# Patient Record
Sex: Female | Born: 1940 | Race: White | Hispanic: No | Smoking: Former smoker
Health system: Southern US, Community
[De-identification: ages and names within clinical notes are randomized; demographics above are authoritative.]

## PROBLEM LIST (undated history)

## (undated) DIAGNOSIS — Z9889 Other specified postprocedural states: Secondary | ICD-10-CM

## (undated) DIAGNOSIS — E039 Hypothyroidism, unspecified: Secondary | ICD-10-CM

## (undated) DIAGNOSIS — G4733 Obstructive sleep apnea (adult) (pediatric): Secondary | ICD-10-CM

## (undated) DIAGNOSIS — I2510 Atherosclerotic heart disease of native coronary artery without angina pectoris: Secondary | ICD-10-CM

## (undated) DIAGNOSIS — Z8679 Personal history of other diseases of the circulatory system: Secondary | ICD-10-CM

## (undated) DIAGNOSIS — M179 Osteoarthritis of knee, unspecified: Secondary | ICD-10-CM

## (undated) DIAGNOSIS — F419 Anxiety disorder, unspecified: Secondary | ICD-10-CM

## (undated) DIAGNOSIS — I4891 Unspecified atrial fibrillation: Secondary | ICD-10-CM

## (undated) DIAGNOSIS — R160 Hepatomegaly, not elsewhere classified: Secondary | ICD-10-CM

## (undated) DIAGNOSIS — E041 Nontoxic single thyroid nodule: Secondary | ICD-10-CM

## (undated) DIAGNOSIS — H3530 Unspecified macular degeneration: Secondary | ICD-10-CM

## (undated) DIAGNOSIS — E785 Hyperlipidemia, unspecified: Secondary | ICD-10-CM

## (undated) DIAGNOSIS — G4700 Insomnia, unspecified: Secondary | ICD-10-CM

## (undated) DIAGNOSIS — I5032 Chronic diastolic (congestive) heart failure: Secondary | ICD-10-CM

## (undated) DIAGNOSIS — R001 Bradycardia, unspecified: Secondary | ICD-10-CM

## (undated) DIAGNOSIS — N200 Calculus of kidney: Secondary | ICD-10-CM

## (undated) DIAGNOSIS — M1710 Unilateral primary osteoarthritis, unspecified knee: Secondary | ICD-10-CM

## (undated) DIAGNOSIS — Z7901 Long term (current) use of anticoagulants: Secondary | ICD-10-CM

## (undated) HISTORY — DX: Atherosclerotic heart disease of native coronary artery without angina pectoris: I25.10

## (undated) HISTORY — DX: Obstructive sleep apnea (adult) (pediatric): G47.33

## (undated) HISTORY — DX: Insomnia, unspecified: G47.00

## (undated) HISTORY — DX: Unilateral primary osteoarthritis, unspecified knee: M17.10

## (undated) HISTORY — PX: CARDIAC CATHETERIZATION: SHX172

## (undated) HISTORY — PX: BACK SURGERY: SHX140

## (undated) HISTORY — DX: Other specified postprocedural states: Z98.890

## (undated) HISTORY — DX: Unspecified atrial fibrillation: I48.91

## (undated) HISTORY — PX: CATARACT EXTRACTION W/ INTRAOCULAR LENS  IMPLANT, BILATERAL: SHX1307

## (undated) HISTORY — DX: Hypothyroidism, unspecified: E03.9

## (undated) HISTORY — DX: Hyperlipidemia, unspecified: E78.5

## (undated) HISTORY — DX: Long term (current) use of anticoagulants: Z79.01

## (undated) HISTORY — DX: Personal history of other diseases of the circulatory system: Z86.79

## (undated) HISTORY — DX: Nontoxic single thyroid nodule: E04.1

## (undated) HISTORY — DX: Chronic diastolic (congestive) heart failure: I50.32

## (undated) HISTORY — DX: Unspecified macular degeneration: H35.30

## (undated) HISTORY — PX: BREAST BIOPSY: SHX20

## (undated) HISTORY — DX: Osteoarthritis of knee, unspecified: M17.9

## (undated) HISTORY — PX: DILATION AND CURETTAGE OF UTERUS: SHX78

## (undated) HISTORY — DX: Bradycardia, unspecified: R00.1

## (undated) HISTORY — PX: CARDIOVERSION: SHX1299

---
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
## 1984-07-14 HISTORY — PX: ABDOMINAL HYSTERECTOMY: SHX81

## 1995-07-15 HISTORY — PX: LUMBAR LAMINECTOMY: SHX95

## 1997-11-01 ENCOUNTER — Ambulatory Visit (HOSPITAL_COMMUNITY): Admission: RE | Admit: 1997-11-01 | Discharge: 1997-11-01 | Payer: Self-pay | Admitting: *Deleted

## 1998-12-24 ENCOUNTER — Ambulatory Visit (HOSPITAL_COMMUNITY): Admission: RE | Admit: 1998-12-24 | Discharge: 1998-12-24 | Payer: Self-pay | Admitting: Endocrinology

## 1998-12-24 ENCOUNTER — Encounter: Payer: Self-pay | Admitting: Endocrinology

## 1999-02-19 ENCOUNTER — Other Ambulatory Visit: Admission: RE | Admit: 1999-02-19 | Discharge: 1999-02-19 | Payer: Self-pay | Admitting: Gynecology

## 1999-12-12 ENCOUNTER — Other Ambulatory Visit: Admission: RE | Admit: 1999-12-12 | Discharge: 1999-12-12 | Payer: Self-pay | Admitting: Gynecology

## 1999-12-23 ENCOUNTER — Encounter: Admission: RE | Admit: 1999-12-23 | Discharge: 1999-12-23 | Payer: Self-pay | Admitting: Gynecology

## 1999-12-23 ENCOUNTER — Encounter: Payer: Self-pay | Admitting: Gynecology

## 2000-12-28 ENCOUNTER — Ambulatory Visit (HOSPITAL_COMMUNITY): Admission: RE | Admit: 2000-12-28 | Discharge: 2000-12-28 | Payer: Self-pay | Admitting: Endocrinology

## 2000-12-28 ENCOUNTER — Encounter: Payer: Self-pay | Admitting: Endocrinology

## 2000-12-29 ENCOUNTER — Encounter: Admission: RE | Admit: 2000-12-29 | Discharge: 2000-12-29 | Payer: Self-pay | Admitting: Gynecology

## 2000-12-29 ENCOUNTER — Encounter: Payer: Self-pay | Admitting: Gynecology

## 2001-02-01 ENCOUNTER — Encounter: Payer: Self-pay | Admitting: Internal Medicine

## 2001-02-01 ENCOUNTER — Encounter: Admission: RE | Admit: 2001-02-01 | Discharge: 2001-02-01 | Payer: Self-pay | Admitting: Internal Medicine

## 2001-02-17 ENCOUNTER — Other Ambulatory Visit: Admission: RE | Admit: 2001-02-17 | Discharge: 2001-02-17 | Payer: Self-pay | Admitting: Gynecology

## 2001-06-28 ENCOUNTER — Ambulatory Visit (HOSPITAL_COMMUNITY): Admission: RE | Admit: 2001-06-28 | Discharge: 2001-06-28 | Payer: Self-pay | Admitting: Endocrinology

## 2001-06-28 ENCOUNTER — Encounter: Payer: Self-pay | Admitting: Endocrinology

## 2001-11-13 ENCOUNTER — Ambulatory Visit (HOSPITAL_COMMUNITY): Admission: RE | Admit: 2001-11-13 | Discharge: 2001-11-13 | Payer: Self-pay | Admitting: Internal Medicine

## 2001-11-13 ENCOUNTER — Encounter: Payer: Self-pay | Admitting: Internal Medicine

## 2002-01-11 ENCOUNTER — Encounter: Payer: Self-pay | Admitting: Gynecology

## 2002-01-11 ENCOUNTER — Encounter: Admission: RE | Admit: 2002-01-11 | Discharge: 2002-01-11 | Payer: Self-pay | Admitting: Gynecology

## 2002-02-21 ENCOUNTER — Other Ambulatory Visit: Admission: RE | Admit: 2002-02-21 | Discharge: 2002-02-21 | Payer: Self-pay | Admitting: Gynecology

## 2002-12-27 ENCOUNTER — Encounter: Payer: Self-pay | Admitting: Endocrinology

## 2002-12-27 ENCOUNTER — Ambulatory Visit (HOSPITAL_COMMUNITY): Admission: RE | Admit: 2002-12-27 | Discharge: 2002-12-27 | Payer: Self-pay | Admitting: Endocrinology

## 2003-01-26 ENCOUNTER — Encounter: Payer: Self-pay | Admitting: Gynecology

## 2003-01-26 ENCOUNTER — Encounter: Admission: RE | Admit: 2003-01-26 | Discharge: 2003-01-26 | Payer: Self-pay | Admitting: Gynecology

## 2003-04-17 ENCOUNTER — Other Ambulatory Visit: Admission: RE | Admit: 2003-04-17 | Discharge: 2003-04-17 | Payer: Self-pay | Admitting: Gynecology

## 2003-05-25 ENCOUNTER — Ambulatory Visit (HOSPITAL_COMMUNITY): Admission: RE | Admit: 2003-05-25 | Discharge: 2003-05-25 | Payer: Self-pay | Admitting: Gastroenterology

## 2003-07-15 DIAGNOSIS — Z9889 Other specified postprocedural states: Secondary | ICD-10-CM

## 2003-07-15 HISTORY — DX: Other specified postprocedural states: Z98.890

## 2003-12-22 ENCOUNTER — Ambulatory Visit (HOSPITAL_COMMUNITY): Admission: RE | Admit: 2003-12-22 | Discharge: 2003-12-22 | Payer: Self-pay | Admitting: Endocrinology

## 2004-02-08 ENCOUNTER — Encounter: Admission: RE | Admit: 2004-02-08 | Discharge: 2004-02-08 | Payer: Self-pay | Admitting: Gynecology

## 2004-04-18 ENCOUNTER — Other Ambulatory Visit: Admission: RE | Admit: 2004-04-18 | Discharge: 2004-04-18 | Payer: Self-pay | Admitting: Gynecology

## 2004-05-14 HISTORY — PX: MITRAL VALVE REPAIR: SHX2039

## 2004-05-14 HISTORY — PX: TRICUSPID VALVE SURGERY: SHX817

## 2004-08-19 ENCOUNTER — Encounter (HOSPITAL_COMMUNITY): Admission: RE | Admit: 2004-08-19 | Discharge: 2004-11-17 | Payer: Self-pay | Admitting: Interventional Cardiology

## 2005-04-08 ENCOUNTER — Encounter: Admission: RE | Admit: 2005-04-08 | Discharge: 2005-04-08 | Payer: Self-pay | Admitting: Gynecology

## 2005-04-24 ENCOUNTER — Other Ambulatory Visit: Admission: RE | Admit: 2005-04-24 | Discharge: 2005-04-24 | Payer: Self-pay | Admitting: Gynecology

## 2006-04-28 ENCOUNTER — Encounter: Admission: RE | Admit: 2006-04-28 | Discharge: 2006-04-28 | Payer: Self-pay | Admitting: Gynecology

## 2006-05-05 ENCOUNTER — Other Ambulatory Visit: Admission: RE | Admit: 2006-05-05 | Discharge: 2006-05-05 | Payer: Self-pay | Admitting: Gynecology

## 2006-05-12 ENCOUNTER — Encounter: Admission: RE | Admit: 2006-05-12 | Discharge: 2006-05-12 | Payer: Self-pay | Admitting: Gynecology

## 2007-06-15 ENCOUNTER — Encounter: Admission: RE | Admit: 2007-06-15 | Discharge: 2007-06-15 | Payer: Self-pay | Admitting: Gynecology

## 2008-05-03 ENCOUNTER — Ambulatory Visit: Payer: Self-pay | Admitting: Internal Medicine

## 2008-05-16 ENCOUNTER — Encounter: Admission: RE | Admit: 2008-05-16 | Discharge: 2008-05-16 | Payer: Self-pay | Admitting: Interventional Cardiology

## 2008-05-18 ENCOUNTER — Ambulatory Visit (HOSPITAL_COMMUNITY): Admission: RE | Admit: 2008-05-18 | Discharge: 2008-05-18 | Payer: Self-pay | Admitting: Interventional Cardiology

## 2008-06-20 ENCOUNTER — Encounter: Admission: RE | Admit: 2008-06-20 | Discharge: 2008-06-20 | Payer: Self-pay | Admitting: Gynecology

## 2009-02-11 HISTORY — PX: KNEE CARTILAGE SURGERY: SHX688

## 2009-06-21 ENCOUNTER — Encounter: Admission: RE | Admit: 2009-06-21 | Discharge: 2009-06-21 | Payer: Self-pay | Admitting: Gynecology

## 2010-07-16 ENCOUNTER — Encounter
Admission: RE | Admit: 2010-07-16 | Discharge: 2010-07-16 | Payer: Self-pay | Source: Home / Self Care | Attending: Gynecology | Admitting: Gynecology

## 2010-08-03 ENCOUNTER — Encounter: Payer: Self-pay | Admitting: Gynecology

## 2010-10-22 ENCOUNTER — Other Ambulatory Visit: Payer: Self-pay | Admitting: Internal Medicine

## 2010-10-22 DIAGNOSIS — M542 Cervicalgia: Secondary | ICD-10-CM

## 2010-10-28 ENCOUNTER — Ambulatory Visit
Admission: RE | Admit: 2010-10-28 | Discharge: 2010-10-28 | Disposition: A | Discharge status: Home / Self Care | Payer: Medicare Other | Source: Ambulatory Visit | Attending: Internal Medicine | Admitting: Internal Medicine

## 2010-10-28 DIAGNOSIS — M542 Cervicalgia: Secondary | ICD-10-CM

## 2010-11-26 NOTE — Letter (Signed)
May 03, 2008    Lyn Records, MD  [address] [place], [address]  [place], [place] [address]   RE:  Jo Hughes  MRN:  [id number]  /  DOB:  03-15-1941   Dear Jo Hughes:   It was a pleasure seeing Jo Hughes at your request because of her  atrial flutter.   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  Tenny Hughes' eyes, and their eyes until 2005 when she underwent surgical  repair.  I had a chance to see the operative note and a right atrial  incision and a left atrial roots incision were both made.  She had  postoperative atrial fibrillation that was self resolving and amiodarone  was initiated.   At some point, amiodarone was stopped in the fall of 2007 and then she  had recurrence of arrhythmia on June 02, 2006.  An electrocardiogram  of this is described below, but was notable for atrial fibrillation.  Amiodarone was resumed at that time.  It was continued until March 2009  at which point, it was discontinued again.  She saw Dr. Kirby Hughes in  the summer of 2009.  Electrocardiogram was erroneously read as sinus  rhythm, you accurately read as atrial flutter and then Holter monitoring  demonstrated persistence of atrial flutter and amiodarone was then  reinitiated.  She is referred for consideration of catheter ablation.   She is quite convinced that there is a close correlation between her  symptoms of fatigue, exercise intolerance, and the presence of her  atrial arrhythmias.   There is also noted that in the past that there was a tremor with  amiodarone.  She did not mention that to me today, but I see the note  from April 27, 2008.   PAST MEDICAL  HISTORY:  1. In addition to the above, has been notable for treated      hypothyroidism, anxiety/depression, night sweats that had been      relatively continues overtime.  2. Dry eyes.   REVIEW OF SYSTEMS:  Broadly negative apart from the above.  She did made  mention of the tremors in the situation, but did not link in her mind to  amiodarone.   PAST SURGICAL HISTORY:  Notable for breast biopsies x2 most recently 17  years ago.  She is status post hysterectomy, back surgery, in addition  to the heart surgery.   SOCIAL HISTORY:  She is married.  She has no children.  She does not use  cigarettes or recreational drugs.  She drinks alcohol daily.  She does  walk 30 minutes a day.  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   MEDICATIONS:  1. Vytorin 10/40.  2. Minocycline 25 a day.  3. Synthroid 80 mcg.  4. Furosemide 40.  5. Potassium.  6. Amiodarone 200 a day.  7. Warfarin.  8. Paroxetine 37.5 nightly.  9. Bupropion 150 daily.  10.Restasis drops/Elestat drops.   ALLERGIES:  She has no known drug allergies.   PHYSICAL EXAMINATION:  GENERAL:  She is an older Caucasian female who  appear in her stated age of 69.  VITAL SIGNS:  Her blood pressure  is 114/70, her pulse is 60, and her  weight is 146.  HEENT:  Demonstrated no icterus or xanthoma.  NECK:  Veins are flat.  The carotids are brisk and full bilaterally  without bruits.  BACK:  Without kyphosis or scoliosis.  LUNGS:  Clear.  HEART:  The heart sounds are irregular, but no murmurs are appreciated.  ABDOMEN:  Soft with active bowel sounds.  There is no midline pulsation  or hepatomegaly.  EXTREMITIES:  Femoral pulses are 2+.  Distal pulses are intact.  There  is no clubbing, cyanosis, or edema.  NEUROLOGIC:  Grossly normal.  SKIN:  Warm and dry.   Electrocardiograms were reviewed.  The tracing from November 2007  demonstrated atrial fibrillation that was quite coarse, but clearly  irregular.   Electrocardiograms  from the fall of 2009 demonstrated an atrial flutter  that is atypical in this upright in lead V1, but is also upright in the  inferior leads.   Holter monitor confirmed atrial flutter.   IMPRESSION:  1. Atrial flutter and atrial fibrillation.  2. Status post tricuspid and mitral valve repair with an atrial      lateral incision and left atrial root incision.  3. Normal left ventricular function.  4. Significant symptoms of exercise impairment associated with the      above.  5. History of depression.   Thank you, it was pleasure seeing Jo Hughes.  I mentioned in my phone  call that there may be benefit in demonstrating whether she is  symptomatically improved being maintained in sinus rhythm by either drug  therapy or catheter ablation. .  I would suggest that we  proceed with  cardioversion to help [place] answer that.  Catheter ablation in an  asymptomatic patient probably needs to wait on [place] getting hard outcome  endpoints  Flutter circuits that she could have are around the right atrial  incision and the left atrial incision and at the tricuspid annulus as  well as some combinations of the above.  Extensive mapping can identify  those and potentially eliminate them, and the new mapping system, which  [facility] has recently purchased is, is key for something like that.  In  addition, however with her atrial fibrillation, she would also probably  be considered for pulmonary vein isolation. a I think, the likelihood of  recurrence of atrial arrhythmias is not at all trivial and so failing  medical therapy would be an appropriate trigger for consideration of  that kind of a complex procedure.  I have reviewed this extensively with  her.  I will look forward to talking with you more about her directly.    Sincerely,      Jo Salvia, MD, [facility]  Electronically Signed    SCK/MedQ  DD: 05/03/2008  DT: 05/04/2008  Job #: [id number]

## 2010-11-26 NOTE — Consult Note (Signed)
NAMEKIELI, GOLLADAY NO.:  [id number]   MEDICAL RECORD NO.:  [id number]          PATIENT TYPE:  OIB   LOCATION:  2899                         FACILITY:  [facility]   PHYSICIAN:  Lyn Records, M.D.   DATE OF BIRTH:  July 21, 1940   DATE OF CONSULTATION:  DATE OF DISCHARGE:  05/18/2008                                 CONSULTATION   INDICATIONS:  Ms. Jo Hughes is 89 and has a history of mitral valve repair  at the [facility] approximately 2 years ago.  She has some  difficulty with postoperative atrial fibrillation.  Over the past 3  months, she has developed atrial flutter with decreased energy.  She has  been loaded with amiodarone and Coumadin therapy and is now undergoing  cardioversion to restore normal sinus rhythm.   DESCRIPTION:  The patient was seen and determined to still be in atrial  flutter by preprocedure EKG.  She then received Diprivan administered by  Dr. Jacklynn Bue receiving total of 100 mg.  She then received a single  biphasic shock with anterior-posterior electrodes configuration with  return of sinus bradycardia at 47 beats per minute.  The patient  awakened without neurological sequelae.   CONCLUSIONS:  Successful elective electrical cardioversion from atrial  flutter to sinus bradycardia.   PLAN:  Home later today.  Continue amiodarone at 200 mg per day, may  have to decrease dose further because the patient has a tremor since  starting the medication.  She was seen by Dr. Graciela Husbands but was not felt to  be a reasonable candidate for ablation therapy in [place] and if she  needs that will probably be better served by returning to [facility].   ADDENDUM:  Today's INR is 2.3.      Lyn Records, M.D.  Electronically Signed     HWS/MEDQ  D:  05/18/2008  T:  05/18/2008  Job:  [id number]   cc:   Duke Salvia, MD, [facility]

## 2010-11-29 NOTE — Op Note (Signed)
   NAME:  Jo Hughes, Jo Hughes NO.:  [id number]   MEDICAL RECORD NO.:  [id number]                   PATIENT TYPE:  AMB   LOCATION:  ENDO                                 FACILITY:  [facility]   PHYSICIAN:  Danise Edge, M.D.                DATE OF BIRTH:  [birth year]   DATE OF PROCEDURE:  05/25/2003  DATE OF DISCHARGE:                                 OPERATIVE REPORT   PROCEDURE:  Colonoscopy.   INDICATIONS FOR PROCEDURE:  Jo Hughes is a 70 year old female born  1941-05-21.  The patient is scheduled to undergo her first screening  colonoscopy with polypectomy to prevent colon cancer.  She has intermittent  painless hematochezia probably secondary to internal hemorrhoidal bleeding.   The patient requires infective endocarditis prophylaxis and received 2 grams  of ampicillin and 100 mg of gentamicin prior to undergoing colonoscopy.   ENDOSCOPIST:  Danise Edge, M.D.   PREMEDICATION:  Versed 7 mg and Fentanyl 75 mcg.   PROCEDURE:  After obtaining informed consent the patient was placed in the  left lateral decubitus position.  I administered intravenous Fentanyl and  intravenous Versed to achieve conscious sedation for the procedure.  The  patient's blood pressure, oxygen saturation and cardiac rhythm were  monitored throughout the procedure and are documented in the medical record.   Anal inspection was normal.  Digital rectal examination was normal.  The  Olympus adjustable pediatric colonoscope was introduced into the rectum and  advanced to the cecum.  Colonic preparation for the exam today was  excellent.  There was solid vegetable material in her cecum which with  vigorous saline irrigation I was able to for the most part clear to get an  adequate view of the cecal mucosa.   Rectum:  Normal.   Sigmoid colon and descending colon:  Normal.   Splenic flexure:  Normal.   Transverse colon:  Normal.   Hepatic flexure:  Normal.   Ascending colon:  Normal.   Cecum and ileocecal valve:  Normal.    ASSESSMENT:  Normal proctocolonoscopy to the cecum.  No endoscopic evidence  for the present of colorectal neoplasia.                                               Danise Edge, M.D.    MJ/MEDQ  D:  05/25/2003  T:  05/25/2003  Job:  [id number]   cc:   Thora Lance, M.D.  [address]  [place]  [place] [address]  Fax: [phone]

## 2011-08-18 ENCOUNTER — Other Ambulatory Visit: Payer: Self-pay | Admitting: Gynecology

## 2011-08-18 DIAGNOSIS — Z1231 Encounter for screening mammogram for malignant neoplasm of breast: Secondary | ICD-10-CM

## 2011-08-21 ENCOUNTER — Encounter: Payer: Self-pay | Admitting: Internal Medicine

## 2011-08-21 ENCOUNTER — Ambulatory Visit (INDEPENDENT_AMBULATORY_CARE_PROVIDER_SITE_OTHER): Payer: Medicare Other | Admitting: Internal Medicine

## 2011-08-21 DIAGNOSIS — I4891 Unspecified atrial fibrillation: Secondary | ICD-10-CM

## 2011-08-21 DIAGNOSIS — I484 Atypical atrial flutter: Secondary | ICD-10-CM | POA: Insufficient documentation

## 2011-08-21 DIAGNOSIS — I4892 Unspecified atrial flutter: Secondary | ICD-10-CM

## 2011-08-21 DIAGNOSIS — I38 Endocarditis, valve unspecified: Secondary | ICD-10-CM

## 2011-08-21 NOTE — Patient Instructions (Signed)
Your physician recommends that you schedule a follow-up appointment with Dr. Johney Frame as needed.  Follow up with Dr. Katrinka Blazing for cardioversion.  Increase Amiodarone to 200mg  twice daily.  Weekly INR at [facility].

## 2011-08-21 NOTE — Assessment & Plan Note (Signed)
The patient presents today for EP consultation regarding recurrent symptomatic atypical atrial flutter.  She has a h/o valvular heart disease with endocarditis and is s/p mitral and tricuspid repairs 2005 at [facility].  I have reviewed the operative notes from this procedure which reveal that she had a small incision made in the right atrium (exact location not specified) to allow for tricuspid repair.  The mitral valve was approached through the dome of the left atrium.  The atrial appendage was oversewn.   Her atypical flutter could represent cavotricuspid reentry, mitral annular reentry, atriotomy scar flutter, or a different flutter.  She has had adequate rhythm control with amiodarone 200mg  daily but has recently returned to atypical atrial flutter with lower doses of amiodarone (in the setting of tremor).  Therapeutic strategies for atypical atrial flutter including medicine and ablation were discussed in detail with the patient today. Risk, benefits, and alternatives to EP study and radiofrequency ablation for afib were also discussed in detail today.  She understands that given her significant atrial scarring from surgery, valvular disease, and endocarditis that her anticipated success with ablation off of antiarrhythmic drugs long term would be quite low (probably 50%).  At this time, she would like to continue medical therapy.  I will therefore increase amiodarone to 200mg  BID today.  After two weeks, I will ask that Dr Katrinka Blazing proceed with cardioversion.  Hopefully, she will maintain sinus rhythm with amiodarone 200mg  daily going forward.  I do not think that she has any other reasonable antiarrhythmic drug options at this point.  If her atypical flutter or atrial fibrillation recur in the near future, then she would be more willing to proceed with ablation at that time.  She is aware that with increased amiodarone she will need INRs followed closely.  I have instructed her to arrange INR  follow-up in Dr Michaelle Copas office next week. Given her valvular atrial arrhythmias, she is not a candidate for novel anticoagulants at this time.  She is s/p atrial appendage removal and hopefully has a decreased risk for stroke long term.

## 2011-08-21 NOTE — Progress Notes (Signed)
Primary Care Physician: Jo Funk, MD Referring Physician:[name], MD, MD   Jo Hughes is a 71 y.o. female with a h/o valvular heart disease and recurrent atrial arrhythmias who presents today for EP consultation.  She reports that she was initially diagnosed with atrial fibrillation following mitral/ tricuspid valve repair 2005.  She has had intermittent episodes since that time.  She has bene treated with amiodarone and has required cardioversion once.  She reports that she underwent cardioversion 11/52009.  She did well thereafter without further episodes.  Her amiodarone was decreased to 100mg  daily due to tremors 4/11.  Though her tremors improved, she developed recurrent atypical atrial flutter.  She recently returned to [facility] and was found to have returned to atypical atrial flutter.  She reports return of fatigue and decreased exercise tolerance.  Today, she denies symptoms of palpitations, chest pain, lower extremity edema, dizziness, presyncope, syncope, or neurologic sequela. The patient is tolerating medications without difficulties and is otherwise without complaint today.   Past Medical History  Diagnosis Date  . Hx of atrial flutter july 2009    atypical atrial flutter  . H/O mitral valve repair [facility] [place].  . H/O tricuspid valve repair   . Hx of acquired endocarditis     Prior to mitral valve repair on 2 occasions.  . Lumbosacral disc disease   . Hyperlipidemia     cardiac cath clean coronary arties in the past.  . Migraine headache   . Glaucoma   . Dry eyes   . Blepharitis   . Macular degeneration     early , [place]  . Goiter   . 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 Hypothyroid   . Atrial fibrillation     persistent   Past Surgical History  Procedure Date  . Mitral valve repair [facility]  . Lumbar disc surgery 1997  . Tonsillectomy and adenoidectomy   . Vaginal  hysterectomy   . Knee arthroscopy 02/2009    LEFT,GRAVES  . Cataracts 2011    OU     Current Outpatient Prescriptions  Medication Sig Dispense Refill  . ALPRAZolam (XANAX) 0.5 MG tablet Take 0.5 mg by mouth 3 (three) times daily as needed.      Marland Kitchen amiodarone (PACERONE) 200 MG tablet Take 200 mg by mouth daily.      . Calcium Carbonate-Vitamin D (CALCIUM 600 + D PO) Take 1 tablet by mouth 2 (two) times daily.      . cycloSPORINE (RESTASIS) 0.05 % ophthalmic emulsion Apply 1 drop to eye 2 (two) times daily.      . Flaxseed, Linseed, 1000 MG CAPS Take 1 capsule by mouth daily.      . fluticasone (FLONASE) 50 MCG/ACT nasal spray Place 2 sprays into the nose daily.      . furosemide (LASIX) 40 MG tablet Take 40 mg by mouth daily.      Marland Kitchen gabapentin (NEURONTIN) 300 MG capsule Take 300 mg by mouth as directed.      . latanoprost (XALATAN) 0.005 % ophthalmic solution Apply 1 drop to eye daily.      Marland Kitchen levothyroxine (SYNTHROID, LEVOTHROID) 88 MCG tablet Take 88 mcg by mouth daily.      . Omega-3 Fatty Acids (FISH OIL) 1200 MG CAPS Take 1 capsule by mouth daily.      Marland Kitchen OVER THE COUNTER MEDICATION OTC beta  blocker eye drop. Use as directed.      Marland Kitchen PARoxetine (PAXIL-CR) 37.5 MG 24 hr tablet Take 37.5 mg by mouth every morning.      . potassium chloride (KLOR-CON) 20 MEQ packet Take 20 mEq by mouth daily.      . traMADol (ULTRAM) 50 MG tablet Take 50 mg by mouth every 6 (six) hours as needed.      . warfarin (COUMADIN) 5 MG tablet Take 5 mg by mouth daily. Take 2.5 by mouth a day except 5 mg on Monday & Friday.      . zolmitriptan (ZOMIG) 5 MG tablet Take 5 mg by mouth as needed.      . zolpidem (AMBIEN) 10 MG tablet Take 10 mg by mouth at bedtime as needed.      . Multiple Vitamins-Minerals (ICAPS) CAPS Take 1 capsule by mouth daily.      . rosuvastatin (CRESTOR) 40 MG tablet Take 40 mg by mouth daily.        Allergies  Allergen Reactions  . Trazodone And Nefazodone Other (See Comments)     Weakness.    History   Social History  . Marital Status: Married    Spouse Name: N/A    Number of Children: N/A  . Years of Education: N/A   Occupational History  . Not on file.   Social History Main Topics  . Smoking status: Former Smoker    Quit date: 07/14/1985  . Smokeless tobacco: Not on file  . Alcohol Use: 4.0 oz/week    8 drink(s) per week     wine 4 glasses/wk  . Drug Use: No  . Sexually Active: Not on file   Other Topics Concern  . Not on file   Social History Narrative   Lives in [place] with spouse.  No children.  Retired Child psychotherapist.    Family History  Problem Relation Age of Onset  . Stroke Father 49    MI ,DECEASED  . Transient ischemic attack Mother     DECEASED  . Macular degeneration Mother   . Hypertension Brother     ALIVE    ROS- All systems are reviewed and negative except as per the HPI above In addition, she reports nonproductive cough with nasal congestion and hoarseness x 3 days.  She denies fevers, chills, or sore throat  Physical Exam: Filed Vitals:   08/21/11 1539  BP: 132/78  Pulse: 88  Resp: 20  Height: 5\' 5"  (1.651 m)  Weight: 148 lb (67.132 kg)    GEN- The patient is well appearing, alert and oriented x 3 today.   Head- normocephalic, atraumatic Eyes-  Sclera clear, conjunctiva pink Ears- hearing intact Oropharynx- clear without exudate,  She is noticeably hoarse today Neck- supple, no JVP Lymph- no cervical lymphadenopathy Lungs- Clear to ausculation bilaterally, normal work of breathing Heart- irregular rate and rhythm, no murmurs, rubs or gallops, PMI not laterally displaced GI- soft, NT, ND, + BS Extremities- no clubbing, cyanosis, trace edema MS- no significant deformity or atrophy Skin- no rash or lesion Psych- euthymic mood, full affect Neuro- strength and sensation are intact  EKG today reveals atypical appearing atrial flutter with positive flutter waves in V1 as well as the inferior leads, V reate  88,  RBBB, LAD, nonspecific ST/T changes  Assessment and Plan:

## 2011-08-21 NOTE — Assessment & Plan Note (Signed)
Surgical report from 2005 reviewed I do not have results from a recent echo. If she has not had an echo within the last 12 months, I would recommend that one be performed to evaluate her LA size, valvular competence, and LV function.   She will follow closely with Dr Katrinka Blazing sp cardioversion.  I am happy to see her again with the anticipation of proceeding with a complex ablation if her atrial arrhythmias recur.

## 2011-08-21 NOTE — Assessment & Plan Note (Signed)
She is at risk for afib long term.  If in the future we find ourselves ablating in the left atrium, I think that pulmonary vein isolation should be performed at that time.

## 2011-09-03 ENCOUNTER — Encounter (HOSPITAL_COMMUNITY): Payer: Self-pay | Admitting: Pharmacy Technician

## 2011-09-09 ENCOUNTER — Ambulatory Visit
Admission: RE | Admit: 2011-09-09 | Discharge: 2011-09-09 | Disposition: A | Discharge status: Home / Self Care | Payer: Medicare Other | Source: Ambulatory Visit | Attending: Gynecology | Admitting: Gynecology

## 2011-09-09 DIAGNOSIS — Z1231 Encounter for screening mammogram for malignant neoplasm of breast: Secondary | ICD-10-CM

## 2011-09-15 ENCOUNTER — Other Ambulatory Visit: Payer: Self-pay | Admitting: Interventional Cardiology

## 2011-09-18 ENCOUNTER — Other Ambulatory Visit: Payer: Self-pay

## 2011-09-18 ENCOUNTER — Ambulatory Visit (HOSPITAL_COMMUNITY)
Admission: RE | Admit: 2011-09-18 | Discharge: 2011-09-18 | Disposition: A | Discharge status: Home / Self Care | Payer: Medicare Other | Source: Ambulatory Visit | Attending: Interventional Cardiology | Admitting: Interventional Cardiology

## 2011-09-18 ENCOUNTER — Encounter (HOSPITAL_COMMUNITY): Payer: Self-pay | Admitting: Certified Registered"

## 2011-09-18 ENCOUNTER — Ambulatory Visit (HOSPITAL_COMMUNITY): Payer: Medicare Other | Admitting: Certified Registered"

## 2011-09-18 ENCOUNTER — Encounter (HOSPITAL_COMMUNITY)
Admission: RE | Disposition: A | Discharge status: Home / Self Care | Payer: Self-pay | Source: Ambulatory Visit | Attending: Interventional Cardiology

## 2011-09-18 DIAGNOSIS — I4892 Unspecified atrial flutter: Secondary | ICD-10-CM | POA: Insufficient documentation

## 2011-09-18 SURGERY — CARDIOVERSION
Anesthesia: General | Wound class: Clean

## 2011-09-18 MED ORDER — AMIODARONE HCL 200 MG PO TABS: 200.0000 mg | ORAL_TABLET | Freq: Every day | ORAL | Status: DC

## 2011-09-18 MED ORDER — LACTATED RINGERS IV SOLN
INTRAVENOUS | Status: DC | PRN
  Administered 2011-09-18: 09:00:00 via INTRAVENOUS

## 2011-09-18 MED ORDER — HYDROCORTISONE 1 % EX CREA: 1.0000 "application " | TOPICAL_CREAM | Freq: Three times a day (TID) | CUTANEOUS | Status: DC | PRN

## 2011-09-18 MED ORDER — SODIUM CHLORIDE 0.9 % IJ SOLN: 3.0000 mL | Freq: Two times a day (BID) | INTRAMUSCULAR | Status: DC

## 2011-09-18 MED ORDER — PROPOFOL 10 MG/ML IV EMUL
INTRAVENOUS | Status: DC | PRN
  Administered 2011-09-18: 70 mg via INTRAVENOUS

## 2011-09-18 MED ORDER — SODIUM CHLORIDE 0.9 % IJ SOLN: 3.0000 mL | INTRAMUSCULAR | Status: DC | PRN

## 2011-09-18 MED ORDER — SODIUM CHLORIDE 0.9 % IV SOLN: 250.0000 mL | INTRAVENOUS | Status: DC

## 2011-09-18 NOTE — Preoperative (Signed)
Beta Blockers   Reason not to administer Beta Blockers:Not Applicable 

## 2011-09-18 NOTE — Anesthesia Postprocedure Evaluation (Deleted)
Anesthesia Post Note  Patient: Jo Hughes  Procedure(s) Performed: Procedure(s) (LRB): CARDIOVERSION (N/A)  Anesthesia type: general  Patient location: PACU  Post pain: Pain level controlled  Post assessment: Patient's Cardiovascular Status Stable  Last Vitals:  Filed Vitals:   09/18/11 1000  BP: 113/60  Pulse: 47  Temp:   Resp: 18    Post vital signs: Reviewed and stable  Level of consciousness: sedated  Complications: No apparent anesthesia complications

## 2011-09-18 NOTE — Anesthesia Postprocedure Evaluation (Signed)
  Anesthesia Post-op Note  Patient: Jo Hughes  Procedure(s) Performed: Procedure(s) (LRB): CARDIOVERSION (N/A)  Patient Location: Short Stay  Anesthesia Type: MAC  Level of Consciousness: awake, alert  and oriented  Airway and Oxygen Therapy: Patient Spontanous Breathing and Patient connected to nasal cannula oxygen  Post-op Pain: none  Post-op Assessment: Post-op Vital signs reviewed, Patient's Cardiovascular Status Stable, Respiratory Function Stable, Patent Airway, No signs of Nausea or vomiting, Adequate PO intake and Pain level controlled  Post-op Vital Signs: Reviewed and stable  Complications: No apparent anesthesia complications

## 2011-09-18 NOTE — CV Procedure (Signed)
Electrical Cardioversion Procedure Note LATRESA GASSER [id number] 12/21/1940  Procedure: Electrical Cardioversion Indications:  Atrial Flutter  Time Out: Verified patient identification, verified procedure,medications/allergies/relevent history reviewed, required imaging and test results available.  Performed  Procedure Details  The patient was NPO after midnight. Anesthesia was administered at the beside  by Dr. Krista Blue with 70 mg of propofol.  Cardioversion was done with synchronized biphasic defibrillation with AP pads with 150 watts.  The patient converted to normal sinus/sinus bradycardia rhythm. The patient tolerated the procedure well   IMPRESSION:  Successful cardioversion of atrial flutter to normal sinus rhythm/sinus bradycardia.    [name] 09/18/2011, 9:37 AM

## 2011-09-18 NOTE — Discharge Instructions (Signed)
General Anesthetic, Adult A doctor specialized in giving anesthesia (anesthesiologist) or a nurse specialized in giving anesthesia (nurse anesthetist) gives medicine that makes you sleep while a procedure is performed (general anesthetic). Once the general anesthetic has been administered, you will be in a sleeplike state in which you feel no pain. After having a general anestheticyou may feel:   Dizzy.   Weak.   Drowsy.   Confused.  These feelings are normal and can be expected to last for up to 24 hours after the procedure is completed.  LET YOUR CAREGIVER KNOW ABOUT:  Allergies you have.   Medications you are taking, including herbs, eye drops, over the counter medications, dietary supplements, and creams.   Previous problems you have had with anesthetics or numbing medicines.   Use of cigarettes, alcohol, or illicit drugs.   Possibility of pregnancy, if this applies.   History of bleeding or blood disorders, including blood clots and clotting disorders.   Previous surgeries you have had and types of anesthetics you have received.   Family medical history, especially anesthetic problems.   Other health problems.  BEFORE THE PROCEDURE  You may brush your teeth on the morning of surgery but you should have no solid food or non-clear liquids for a minimum of 8 hours prior to your procedure. Clear liquids (water, black coffee, and tea) are acceptable in small amounts until 2 hours prior to your procedure.   You may take your regular medications the morning of your procedure unless your caregiver indicates otherwise.  AFTER THE PROCEDURE  After surgery, you will be taken to the recovery area where a nurse will monitor your progress. You will be allowed to go home when you are awake, stable, taking fluids well, and without serious pain or complications.   For the first 24 hours following an anesthetic:   Have a responsible person with you.   Do not drive a car. If you are  alone, do not take public transportation.   Do not engage in strenuous activity. You may usually resume normal activities the next day, or as advised by your caregiver.   Do not drink alcohol.   Do not take medicine that has not been prescribed by your caregiver.   Do not sign important papers or make important decisions as your judgement may be impaired.   You may resume a normal diet as directed.   Change bandages (dressings) as directed.   Only take over-the-counter or prescription medicines for pain, discomfort, or fever as directed by your caregiver.  If you have questions or problems that seem related to the anesthetic, call the hospital and ask for the anesthetist, anesthesiologist, or anesthesia department. SEEK IMMEDIATE MEDICAL CARE IF:   You develop a rash.   You have difficulty breathing.   You have chest pain.   You have allergic problems.   You have uncontrolled nausea.   You have uncontrolled vomiting.   You develop any serious bleeding, especially from the incision site.  Document Released: 10/07/2007 Document Revised: 06/19/2011 Document Reviewed: 10/31/2010 Exit[employer] Patient Information 2012 [employer]. 

## 2011-09-18 NOTE — Anesthesia Preprocedure Evaluation (Addendum)
Anesthesia Evaluation  Patient identified by MRN, date of birth, ID band Patient awake    Reviewed: Allergy & Precautions, H&P , NPO status , Patient's Chart, lab work & pertinent test results  Airway Mallampati: I TM Distance: >3 FB Neck ROM: Full    Dental  (+) Teeth Intact   Pulmonary    Pulmonary exam normal       Cardiovascular + dysrhythmias Atrial Fibrillation Rhythm:Irregular Rate:Normal     Neuro/Psych  Headaches,    GI/Hepatic   Endo/Other  Hypothyroidism   Renal/GU      Musculoskeletal   Abdominal   Peds  Hematology   Anesthesia Other Findings   Reproductive/Obstetrics                          Anesthesia Physical Anesthesia Plan  ASA: III  Anesthesia Plan: General   Post-op Pain Management:    Induction: Intravenous  Airway Management Planned: Mask  Additional Equipment:   Intra-op Plan:   Post-operative Plan: Extubation in OR  Informed Consent: I have reviewed the patients History and Physical, chart, labs and discussed the procedure including the risks, benefits and alternatives for the proposed anesthesia with the patient or authorized representative who has indicated his/her understanding and acceptance.   Dental advisory given  Plan Discussed with: CRNA, Anesthesiologist and Surgeon  Anesthesia Plan Comments:         Anesthesia Quick Evaluation

## 2011-09-18 NOTE — Transfer of Care (Signed)
Immediate Anesthesia Transfer of Care Note  Patient: Jo Hughes  Procedure(s) Performed: Procedure(s) (LRB): CARDIOVERSION (N/A)  Patient Location: Short Stay  Anesthesia Type: MAC  Level of Consciousness: awake, alert  and oriented  Airway & Oxygen Therapy: Patient Spontanous Breathing and Patient connected to nasal cannula oxygen  Post-op Assessment: Report given to PACU RN, Post -op Vital signs reviewed and stable and Patient moving all extremities  Post vital signs: Reviewed and stable  Complications: No apparent anesthesia complications

## 2011-09-19 ENCOUNTER — Encounter (HOSPITAL_COMMUNITY): Payer: Self-pay | Admitting: Interventional Cardiology

## 2011-09-19 NOTE — H&P (Signed)
  See scanned notes. 

## 2011-10-16 ENCOUNTER — Encounter: Payer: Self-pay | Admitting: Internal Medicine

## 2011-10-17 ENCOUNTER — Encounter: Payer: Self-pay | Admitting: Internal Medicine

## 2011-12-18 ENCOUNTER — Encounter: Payer: Self-pay | Admitting: Internal Medicine

## 2011-12-18 ENCOUNTER — Ambulatory Visit (INDEPENDENT_AMBULATORY_CARE_PROVIDER_SITE_OTHER): Payer: Medicare Other | Admitting: Internal Medicine

## 2011-12-18 VITALS — BP 106/56 | HR 42 | Ht 64.0 in | Wt 147.0 lb

## 2011-12-18 DIAGNOSIS — I4892 Unspecified atrial flutter: Secondary | ICD-10-CM

## 2011-12-18 DIAGNOSIS — I38 Endocarditis, valve unspecified: Secondary | ICD-10-CM

## 2011-12-18 DIAGNOSIS — I484 Atypical atrial flutter: Secondary | ICD-10-CM

## 2011-12-18 DIAGNOSIS — I4891 Unspecified atrial fibrillation: Secondary | ICD-10-CM

## 2011-12-18 DIAGNOSIS — R001 Bradycardia, unspecified: Secondary | ICD-10-CM

## 2011-12-18 DIAGNOSIS — I498 Other specified cardiac arrhythmias: Secondary | ICD-10-CM

## 2011-12-18 NOTE — Assessment & Plan Note (Signed)
Maintaining sinus rhythm with amiodarone.  She is appropriately anticoagulated. Continue amiodarone 200mg  daily long term. She continues to wish to avoid ablation.

## 2011-12-18 NOTE — Patient Instructions (Signed)
Your physician has recommended that you have a pacemaker inserted. A pacemaker is a small device that is placed under the skin of your chest or abdomen to help control abnormal heart rhythms. This device uses electrical pulses to prompt the heart to beat at a normal rate. Pacemakers are used to treat heart rhythms that are too slow. Wire (leads) are attached to the pacemaker that goes into the chambers of you heart. This is done in the hospital and usually requires and overnight stay. Please see the instruction sheet given to you today for more information.  Dennis Bast, RN will be in touch with you in the next week to relay details of the procedure scheduled for January 13, 2012.

## 2011-12-18 NOTE — Assessment & Plan Note (Signed)
As above No changes today 

## 2011-12-18 NOTE — Assessment & Plan Note (Signed)
Recent echo is reviewed EF is preserved and there is no significant valvular disease sp prior repair.  LA size is 49 mm

## 2011-12-18 NOTE — Progress Notes (Signed)
  Primary Cardiologist:  SMITH III,HENRY W, MD, MD  The patient presents today for routine electrophysiology followup.  Since last being seen in our clinic, the patient has been cardioverted to sinus by Dr Smith.  She remains in sinus rhythm with frequent bradycardia.  She reports symptoms of fatigue, breathlessness, and dizziness with her bradycardia.  She had a holter monitor placed by Dr Smith which revealed frequent sinus bradycardia.  She has had no afib or further atrial flutter.  Today, she denies symptoms of palpitations, chest pain,  lower extremity edema,  presyncope, syncope, or neurologic sequela.  The patient feels that she is tolerating medications without difficulties and is otherwise without complaint today.   Past Medical History  Diagnosis Date  . Hx of atrial flutter july 2009    atypical atrial flutter  . H/O mitral valve repair 2005    [facility] , [place].  . H/O tricuspid valve repair   . Hx of acquired endocarditis     Prior to mitral valve repair on 2 occasions.  . Lumbosacral disc disease   . Hyperlipidemia     cardiac cath clean coronary arties in the past.  . Migraine headache   . Glaucoma   . Dry eyes   . Blepharitis   . Macular degeneration     early , Hecker  . Goiter   . 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 Hypothyroid   . Atrial fibrillation     persistent   Past Surgical History  Procedure Date  . Mitral valve repair 2005    [facility]  . Lumbar disc surgery 1997  . Tonsillectomy and adenoidectomy   . Vaginal hysterectomy   . Knee arthroscopy 02/2009    LEFT,GRAVES  . Cataracts 2011    [facility]   . Cardioversion 09/18/2011    Procedure: CARDIOVERSION;  Surgeon: Henry W Smith III, MD;  Location: [facility] OR;  Service: Cardiovascular;  Laterality: N/A;    Current Outpatient Prescriptions  Medication Sig Dispense Refill  . ALPRAZolam (XANAX) 0.5 MG tablet Take 0.5 mg by mouth 3 (three) times daily as  needed. For anxiety      . amiodarone (PACERONE) 200 MG tablet Take 1 tablet (200 mg total) by mouth daily.      . Calcium Carbonate-Vitamin D (CALCIUM 600 + D PO) Take 1 tablet by mouth 2 (two) times daily.      . cycloSPORINE (RESTASIS) 0.05 % ophthalmic emulsion Place 1 drop into both eyes 2 (two) times daily.       . dorzolamide-timolol (COSOPT) 22.3-6.8 MG/ML ophthalmic solution Place 1 drop into both eyes 2 (two) times daily.      . ESTRACE VAGINAL 0.1 MG/GM vaginal cream Place 1 g vaginally 2 (two) times a week.       . Flaxseed, Linseed, 1000 MG CAPS Take 2 capsules by mouth daily.       . fluticasone (FLONASE) 50 MCG/ACT nasal spray Place 2 sprays into the nose as needed.       . furosemide (LASIX) 40 MG tablet Take 40 mg by mouth daily.      . latanoprost (XALATAN) 0.005 % ophthalmic solution Place 1 drop into both eyes daily.       . levothyroxine (SYNTHROID, LEVOTHROID) 88 MCG tablet Take 88 mcg by mouth daily.      . MINIVELLE 0.075 MG/24HR Place 1 patch onto the skin 2 (two) times a week.       .   Multiple Vitamins-Minerals (ICAPS) CAPS Take 1 capsule by mouth 2 (two) times daily.       . Omega-3 Fatty Acids (FISH OIL) 1200 MG CAPS Take 1 capsule by mouth 3 (three) times daily.       . PARoxetine (PAXIL-CR) 37.5 MG 24 hr tablet Take 37.5 mg by mouth every morning.      . potassium chloride SA (K-DUR,KLOR-CON) 20 MEQ tablet Take 20 mEq by mouth daily.      . rosuvastatin (CRESTOR) 40 MG tablet Take 40 mg by mouth daily.      . traMADol (ULTRAM) 50 MG tablet Take 50 mg by mouth every 6 (six) hours as needed. For pain      . warfarin (COUMADIN) 5 MG tablet Take 2.5-5 mg by mouth daily. Take 2.5 by mouth a day except 5 mg on tuesday      . zolmitriptan (ZOMIG) 5 MG tablet Take 5 mg by mouth as needed. For migraines      . zolpidem (AMBIEN) 10 MG tablet Take 10 mg by mouth at bedtime as needed. For sleep        Allergies  Allergen Reactions  . Trazodone And Nefazodone Other (See  Comments)    Weakness.    History   Social History  . Marital Status: Married    Spouse Name: N/A    Number of Children: N/A  . Years of Education: N/A   Occupational History  . Not on file.   Social History Main Topics  . Smoking status: Former Smoker    Quit date: 07/14/1985  . Smokeless tobacco: Not on file  . Alcohol Use: 4.0 oz/week    8 drink(s) per week     wine 4 glasses/wk  . Drug Use: No  . Sexually Active: Not on file   Other Topics Concern  . Not on file   Social History Narrative   Lives in [place] with spouse.  No children.  Retired social worker.    Family History  Problem Relation Age of Onset  . Stroke Father 60    MI ,DECEASED  . Transient ischemic attack Mother     DECEASED  . Macular degeneration Mother   . Hypertension Brother     ALIVE    ROS-  All systems are reviewed and are negative except as outlined in the HPI above   Physical Exam: Filed Vitals:   12/18/11 0859  BP: 106/56  Pulse: 42  Height: 5' 4" (1.626 m)  Weight: 147 lb (66.679 kg)    GEN- The patient is well appearing, alert and oriented x 3 today.   Head- normocephalic, atraumatic Eyes-  Sclera clear, conjunctiva pink Ears- hearing intact Oropharynx- clear Neck- supple, no JVP Lymph- no cervical lymphadenopathy Lungs- Clear to ausculation bilaterally, normal work of breathing Heart- Regular rate and rhythm, no murmurs, rubs or gallops, PMI not laterally displaced GI- soft, NT, ND, + BS Extremities- no clubbing, cyanosis, or edema MS- no significant deformity or atrophy Skin- no rash or lesion Psych- euthymic mood, full affect Neuro- strength and sensation are intact  ekg today reveals sinus bradycardia 42 bpm, PR 204, RBBB Echo from Dr Smith- 12/02/11 reveals EF 60-65%, no MR, trivial TR, moderate LA enlargement (49mm)  Assessment and Plan:  

## 2011-12-18 NOTE — Assessment & Plan Note (Signed)
I have reviewed the patients full holter from Dr Michaelle Copas office which reveals frequent bradycardia with heart rate range 34-94 bpm.  Average HR 54 bpm. The patient has symptoms with her bradycardia.  She requires amiodarone for rhythm control.   I would therefore recommend pacemaker implantation at this time.  Risks, benefits, alternatives to pacemaker implantation were discussed in detail with the patient today. The patient understands that the risks include but are not limited to bleeding, infection, pneumothorax, perforation, tamponade, vascular damage, renal failure, MI, stroke, death,  and lead dislodgement and wishes to proceed. We will therefore schedule the procedure at the next available time.

## 2011-12-24 ENCOUNTER — Encounter: Payer: Self-pay | Admitting: *Deleted

## 2011-12-24 ENCOUNTER — Other Ambulatory Visit: Payer: Self-pay | Admitting: *Deleted

## 2011-12-24 DIAGNOSIS — R001 Bradycardia, unspecified: Secondary | ICD-10-CM

## 2011-12-26 ENCOUNTER — Encounter: Payer: Self-pay | Admitting: Internal Medicine

## 2011-12-30 ENCOUNTER — Encounter (HOSPITAL_COMMUNITY): Payer: Self-pay | Admitting: Respiratory Therapy

## 2012-01-01 ENCOUNTER — Other Ambulatory Visit: Payer: Self-pay | Admitting: *Deleted

## 2012-01-01 DIAGNOSIS — R001 Bradycardia, unspecified: Secondary | ICD-10-CM

## 2012-01-07 ENCOUNTER — Other Ambulatory Visit (INDEPENDENT_AMBULATORY_CARE_PROVIDER_SITE_OTHER): Payer: Medicare Other

## 2012-01-07 DIAGNOSIS — R001 Bradycardia, unspecified: Secondary | ICD-10-CM

## 2012-01-07 DIAGNOSIS — I498 Other specified cardiac arrhythmias: Secondary | ICD-10-CM

## 2012-01-07 LAB — CBC WITH DIFFERENTIAL/PLATELET
Basophils Absolute: 0.1 10*3/uL (ref 0.0–0.1)
Eosinophils Absolute: 0.3 10*3/uL (ref 0.0–0.7)
Lymphocytes Relative: 20.3 % (ref 12.0–46.0)
MCHC: 33.2 g/dL (ref 30.0–36.0)
Neutrophils Relative %: 64.4 % (ref 43.0–77.0)
RDW: 14.3 % (ref 11.5–14.6)

## 2012-01-07 LAB — BASIC METABOLIC PANEL
CO2: 26 mEq/L (ref 19–32)
Calcium: 9.7 mg/dL (ref 8.4–10.5)
Creatinine, Ser: 0.9 mg/dL (ref 0.4–1.2)
Glucose, Bld: 95 mg/dL (ref 70–99)

## 2012-01-07 LAB — PROTIME-INR: INR: 4.2 ratio — ABNORMAL HIGH (ref 0.8–1.0)

## 2012-01-12 ENCOUNTER — Telehealth: Payer: Self-pay | Admitting: Internal Medicine

## 2012-01-12 MED ORDER — CEFAZOLIN SODIUM 1-5 GM-% IV SOLN
1.0000 g | INTRAVENOUS | Status: DC
  Filled 2012-01-12: qty 50

## 2012-01-12 MED ORDER — SODIUM CHLORIDE 0.9 % IJ SOLN: 3.0000 mL | INTRAMUSCULAR | Status: DC | PRN

## 2012-01-12 MED ORDER — CHLORHEXIDINE GLUCONATE 4 % EX LIQD
60.0000 mL | Freq: Once | CUTANEOUS | Status: DC
  Filled 2012-01-12: qty 60

## 2012-01-12 MED ORDER — SODIUM CHLORIDE 0.9 % IJ SOLN: 3.0000 mL | Freq: Two times a day (BID) | INTRAMUSCULAR | Status: DC

## 2012-01-12 MED ORDER — GENTAMICIN SULFATE 40 MG/ML IJ SOLN
80.0000 mg | INTRAMUSCULAR | Status: DC
  Filled 2012-01-12: qty 2

## 2012-01-12 MED ORDER — SODIUM CHLORIDE 0.9 % IV SOLN
250.0000 mL | INTRAVENOUS | Status: DC
  Administered 2012-01-13: 10:00:00 via INTRAVENOUS

## 2012-01-12 MED ORDER — SODIUM CHLORIDE 0.45 % IV SOLN
INTRAVENOUS | Status: DC
  Administered 2012-01-13: 10:00:00 via INTRAVENOUS

## 2012-01-12 NOTE — Telephone Encounter (Signed)
Patient called stated she had INR done today 01/12/12 at [facility]'s office.States INR was 2.1.States she wants to know if her PM procedure for tomorrow 01/13/12 was still on go.Message fowarded to Dr.Allred's nurse.

## 2012-01-12 NOTE — Telephone Encounter (Signed)
Patient called no answer.LMTC. 

## 2012-01-12 NOTE — Telephone Encounter (Signed)
Please return call to patient at [phone] regarding pacer implant procedure 01/13/12.  Patient a couple question before surgery.

## 2012-01-12 NOTE — Telephone Encounter (Signed)
Pt was notified that INR is fine and procedure is still scheduled for tomorrow.

## 2012-01-13 ENCOUNTER — Encounter (HOSPITAL_COMMUNITY): Payer: Self-pay | Admitting: General Practice

## 2012-01-13 ENCOUNTER — Telehealth: Payer: Self-pay

## 2012-01-13 ENCOUNTER — Encounter (HOSPITAL_COMMUNITY)
Admission: RE | Disposition: A | Discharge status: Home / Self Care | Payer: Self-pay | Source: Ambulatory Visit | Attending: Internal Medicine

## 2012-01-13 ENCOUNTER — Ambulatory Visit (HOSPITAL_COMMUNITY)
Admission: RE | Admit: 2012-01-13 | Discharge: 2012-01-14 | Disposition: A | Discharge status: Home / Self Care | Payer: Medicare Other | Source: Ambulatory Visit | Attending: Internal Medicine | Admitting: Internal Medicine

## 2012-01-13 ENCOUNTER — Ambulatory Visit (HOSPITAL_COMMUNITY): Payer: Medicare Other

## 2012-01-13 DIAGNOSIS — I4891 Unspecified atrial fibrillation: Secondary | ICD-10-CM | POA: Diagnosis present

## 2012-01-13 DIAGNOSIS — E785 Hyperlipidemia, unspecified: Secondary | ICD-10-CM | POA: Insufficient documentation

## 2012-01-13 DIAGNOSIS — D472 Monoclonal gammopathy: Secondary | ICD-10-CM | POA: Insufficient documentation

## 2012-01-13 DIAGNOSIS — I495 Sick sinus syndrome: Secondary | ICD-10-CM | POA: Insufficient documentation

## 2012-01-13 DIAGNOSIS — R001 Bradycardia, unspecified: Secondary | ICD-10-CM

## 2012-01-13 DIAGNOSIS — H3530 Unspecified macular degeneration: Secondary | ICD-10-CM | POA: Insufficient documentation

## 2012-01-13 DIAGNOSIS — M1710 Unilateral primary osteoarthritis, unspecified knee: Secondary | ICD-10-CM | POA: Insufficient documentation

## 2012-01-13 DIAGNOSIS — H409 Unspecified glaucoma: Secondary | ICD-10-CM | POA: Insufficient documentation

## 2012-01-13 DIAGNOSIS — Z7901 Long term (current) use of anticoagulants: Secondary | ICD-10-CM | POA: Insufficient documentation

## 2012-01-13 DIAGNOSIS — G43909 Migraine, unspecified, not intractable, without status migrainosus: Secondary | ICD-10-CM | POA: Insufficient documentation

## 2012-01-13 DIAGNOSIS — I38 Endocarditis, valve unspecified: Secondary | ICD-10-CM | POA: Diagnosis present

## 2012-01-13 DIAGNOSIS — I498 Other specified cardiac arrhythmias: Secondary | ICD-10-CM

## 2012-01-13 DIAGNOSIS — E039 Hypothyroidism, unspecified: Secondary | ICD-10-CM | POA: Insufficient documentation

## 2012-01-13 HISTORY — PX: INSERT / REPLACE / REMOVE PACEMAKER: SUR710

## 2012-01-13 HISTORY — DX: Anxiety disorder, unspecified: F41.9

## 2012-01-13 HISTORY — PX: PERMANENT PACEMAKER INSERTION: SHX5480

## 2012-01-13 LAB — SURGICAL PCR SCREEN: MRSA, PCR: NEGATIVE

## 2012-01-13 SURGERY — PERMANENT PACEMAKER INSERTION
Anesthesia: LOCAL

## 2012-01-13 MED ORDER — LIDOCAINE HCL (PF) 1 % IJ SOLN
INTRAMUSCULAR | Status: AC
  Filled 2012-01-13: qty 60

## 2012-01-13 MED ORDER — HYDROCODONE-ACETAMINOPHEN 5-325 MG PO TABS
1.0000 | ORAL_TABLET | ORAL | Status: DC | PRN
  Administered 2012-01-13 – 2012-01-14 (×3): 2 via ORAL
  Filled 2012-01-13 (×2): qty 2

## 2012-01-13 MED ORDER — LIDOCAINE HCL (PF) 1 % IJ SOLN
INTRAMUSCULAR | Status: AC
  Filled 2012-01-13: qty 30

## 2012-01-13 MED ORDER — LATANOPROST 0.005 % OP SOLN
1.0000 [drp] | Freq: Every day | OPHTHALMIC | Status: DC
  Administered 2012-01-13: 1 [drp] via OPHTHALMIC
  Filled 2012-01-13: qty 2.5

## 2012-01-13 MED ORDER — SODIUM CHLORIDE 0.9 % IJ SOLN: 3.0000 mL | INTRAMUSCULAR | Status: DC | PRN

## 2012-01-13 MED ORDER — ALPRAZOLAM 0.5 MG PO TABS: 0.5000 mg | ORAL_TABLET | Freq: Three times a day (TID) | ORAL | Status: DC | PRN

## 2012-01-13 MED ORDER — WARFARIN - PHYSICIAN DOSING INPATIENT: Freq: Every day | Status: DC

## 2012-01-13 MED ORDER — SODIUM CHLORIDE 0.9 % IV SOLN: 250.0000 mL | INTRAVENOUS | Status: DC | PRN

## 2012-01-13 MED ORDER — MIDAZOLAM HCL 5 MG/5ML IJ SOLN
INTRAMUSCULAR | Status: AC
  Filled 2012-01-13: qty 5

## 2012-01-13 MED ORDER — ZOLPIDEM TARTRATE 5 MG PO TABS: 10.0000 mg | ORAL_TABLET | Freq: Every evening | ORAL | Status: DC | PRN

## 2012-01-13 MED ORDER — CYCLOSPORINE 0.05 % OP EMUL
1.0000 [drp] | Freq: Two times a day (BID) | OPHTHALMIC | Status: DC
  Filled 2012-01-13 (×3): qty 1

## 2012-01-13 MED ORDER — HYDROCODONE-ACETAMINOPHEN 5-325 MG PO TABS
ORAL_TABLET | ORAL | Status: AC
  Filled 2012-01-13: qty 2

## 2012-01-13 MED ORDER — DORZOLAMIDE HCL-TIMOLOL MAL 2-0.5 % OP SOLN
1.0000 [drp] | Freq: Two times a day (BID) | OPHTHALMIC | Status: DC
  Filled 2012-01-13: qty 10

## 2012-01-13 MED ORDER — AMIODARONE HCL 200 MG PO TABS
200.0000 mg | ORAL_TABLET | Freq: Every day | ORAL | Status: DC
  Administered 2012-01-14: 200 mg via ORAL
  Filled 2012-01-13: qty 1

## 2012-01-13 MED ORDER — TRAMADOL HCL 50 MG PO TABS
50.0000 mg | ORAL_TABLET | Freq: Four times a day (QID) | ORAL | Status: DC | PRN
  Filled 2012-01-13: qty 1

## 2012-01-13 MED ORDER — CEFAZOLIN SODIUM 1-5 GM-% IV SOLN
INTRAVENOUS | Status: AC
  Filled 2012-01-13: qty 50

## 2012-01-13 MED ORDER — MUPIROCIN 2 % EX OINT
TOPICAL_OINTMENT | CUTANEOUS | Status: AC
  Administered 2012-01-13: 10:00:00
  Filled 2012-01-13: qty 22

## 2012-01-13 MED ORDER — ACETAMINOPHEN 325 MG PO TABS: 650.0000 mg | ORAL_TABLET | ORAL | Status: DC | PRN

## 2012-01-13 MED ORDER — LEVOTHYROXINE SODIUM 88 MCG PO TABS
88.0000 ug | ORAL_TABLET | Freq: Every day | ORAL | Status: DC
  Administered 2012-01-14: 88 ug via ORAL
  Filled 2012-01-13: qty 1

## 2012-01-13 MED ORDER — SODIUM CHLORIDE 0.9 % IJ SOLN
3.0000 mL | Freq: Two times a day (BID) | INTRAMUSCULAR | Status: DC
  Administered 2012-01-13: 3 mL via INTRAVENOUS

## 2012-01-13 MED ORDER — PAROXETINE HCL ER 37.5 MG PO TB24
37.5000 mg | ORAL_TABLET | Freq: Every morning | ORAL | Status: DC
  Administered 2012-01-14: 37.5 mg via ORAL
  Filled 2012-01-13: qty 1

## 2012-01-13 MED ORDER — ONDANSETRON HCL 4 MG/2ML IJ SOLN: 4.0000 mg | Freq: Four times a day (QID) | INTRAMUSCULAR | Status: DC | PRN

## 2012-01-13 MED ORDER — POTASSIUM CHLORIDE CRYS ER 20 MEQ PO TBCR
20.0000 meq | EXTENDED_RELEASE_TABLET | Freq: Every day | ORAL | Status: DC
  Administered 2012-01-14: 20 meq via ORAL
  Filled 2012-01-13: qty 1

## 2012-01-13 MED ORDER — WARFARIN SODIUM 5 MG PO TABS
5.0000 mg | ORAL_TABLET | Freq: Once | ORAL | Status: AC
  Administered 2012-01-13: 5 mg via ORAL
  Filled 2012-01-13: qty 1

## 2012-01-13 MED ORDER — CEFAZOLIN SODIUM 1-5 GM-% IV SOLN
1.0000 g | Freq: Four times a day (QID) | INTRAVENOUS | Status: AC
  Administered 2012-01-13 – 2012-01-14 (×3): 1 g via INTRAVENOUS
  Filled 2012-01-13 (×3): qty 50

## 2012-01-13 MED ORDER — FENTANYL CITRATE 0.05 MG/ML IJ SOLN
INTRAMUSCULAR | Status: AC
  Filled 2012-01-13: qty 2

## 2012-01-13 NOTE — Progress Notes (Signed)
Pt arrived to unit A&0nx4.  Assessment completed by CN.  Denies pain or discomfort.  Lt. Arm  Sling  Intact.with Sm blood stained to dsg.   Will continue to monitor.  Amanda Pea, Charity fundraiser.

## 2012-01-13 NOTE — Op Note (Signed)
SURGEON:  Hillis Range, MD     PREPROCEDURE DIAGNOSIS:  Symptomatic Bradycardia, sick sinus syndrome    POSTPROCEDURE DIAGNOSIS:  Symptomatic Bradycardia,  sick sinus syndrome     PROCEDURES:   1. Left upper extremity venography.   2. Pacemaker implantation.     INTRODUCTION: Jo Hughes is a 71 y.o. female  with a history of bradycardia who presents today for pacemaker implantation.  The patient reports progressive fatigue over the past few months. She has atrial fibrillation for which she requires amiodarone to maintain sinus rhythm.   The patient therefore presents today for pacemaker implantation.     DESCRIPTION OF PROCEDURE:  Informed written consent was obtained, and   the patient was brought to the electrophysiology lab in a fasting state.  The patient received IV versed and fentanyl as outlined in the nursing report.  The patients left chest was prepped and draped in the usual sterile fashion by the EP lab staff. The skin overlying the left deltopectoral region was infiltrated with lidocaine for local analgesia.  A 4-cm incision was made over the left deltopectoral region.  A left subcutaneous pacemaker pocket was fashioned using a combination of sharp and blunt dissection. Electrocautery was required to assure hemostasis.    Left Upper Extremity Venography: A venogram of the left upper extremity was performed, which revealed a small left cephalic vein, which emptied into a large left subclavian vein.  The left axillary vein was moderate in size.    RA/RV Lead Placement: The left axillary vein was therefore cannulated.  Through the left axillary vein, a Medtronic model [id number] (serial number [id number]) right atrial lead and a Medtronic model 5092- 58 (serial number [id number]) right ventricular lead were advanced with fluoroscopic visualization into the right atrial appendage and right ventricular apex positions respectively.  Initial atrial lead P- waves measured 1.mV with impedance  of and a threshold of 1.7V at 0.5 msec.  Right ventricular lead R-waves measured 9mV with an impedance of 569 ms and a threshold of 0.4 V at 0.5 msec.  Both leads   were secured to the pectoralis fascia using #2-0 silk over the suture sleeves.   Device Placement:  The leads were then connected to a Medtronic Adapta L model ADDRL 1 (serial number [id number]) pacemaker.  The pocket was irrigated with copious gentamicin solution.  The pacemaker was then placed into the pocket.  The pocket was then closed in 2 layers with 2.0 Vicryl suture for the subcutaneous and subcuticular layers.  Steri- Strips and a sterile dressing were then applied.  There were no early apparent complications.     CONCLUSIONS:   1. Successful implantation of a Medtronic Adapta L dual-chamber pacemaker for symptomatic bradycardia  2. No early apparent complications.           Hillis Range, MD 01/13/2012 2:33 PM

## 2012-01-13 NOTE — Interval H&P Note (Signed)
History and Physical Interval Note:  01/13/2012 1:14 PM  Jo Hughes  has presented today for surgery, with the diagnosis of bradicardia  The various methods of treatment have been discussed with the patient and family. After consideration of risks, benefits and other options for treatment, the patient has consented to  Procedure(s) (LRB): PERMANENT PACEMAKER INSERTION (N/A) as a surgical intervention .  The patient's history has been reviewed, patient examined, no change in status, stable for surgery.  I have reviewed the patients' chart and labs.  Questions were answered to the patient's satisfaction.     Hillis Range

## 2012-01-13 NOTE — Telephone Encounter (Signed)
INR on 01/12/12 was 2.1.  PT=25.4.  Done at [facility]

## 2012-01-13 NOTE — Progress Notes (Signed)
Pt's dsg. To Lt. Chest wall with small bloody stain D&I Site marked with ink to monitor further bleeding. Remain stable.  Dr.  Johney Frame aware of site and on coming nurse.  Amanda Pea, Charity fundraiser.

## 2012-01-13 NOTE — H&P (View-Only) (Signed)
Primary Cardiologist:  [name], MD, MD  The patient presents today for routine electrophysiology followup.  Since last being seen in our clinic, the patient has been cardioverted to sinus by Dr Katrinka Blazing.  She remains in sinus rhythm with frequent bradycardia.  She reports symptoms of fatigue, breathlessness, and dizziness with her bradycardia.  She had a holter monitor placed by Dr Katrinka Blazing which revealed frequent sinus bradycardia.  She has had no afib or further atrial flutter.  Today, she denies symptoms of palpitations, chest pain,  lower extremity edema,  presyncope, syncope, or neurologic sequela.  The patient feels that she is tolerating medications without difficulties and is otherwise without complaint today.   Past Medical History  Diagnosis Date  . Hx of atrial flutter july 2009    atypical atrial flutter  . H/O mitral valve repair [facility] , [place].  . H/O tricuspid valve repair   . Hx of acquired endocarditis     Prior to mitral valve repair on 2 occasions.  . Lumbosacral disc disease   . Hyperlipidemia     cardiac cath clean coronary arties in the past.  . Migraine headache   . Glaucoma   . Dry eyes   . Blepharitis   . Macular degeneration     early , Hecker  . Goiter   . 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 Hypothyroid   . Atrial fibrillation     persistent   Past Surgical History  Procedure Date  . Mitral valve repair [facility]  . Lumbar disc surgery 1997  . Tonsillectomy and adenoidectomy   . Vaginal hysterectomy   . Knee arthroscopy 02/2009    LEFT,GRAVES  . Cataracts 2011    [facility]   . Cardioversion 09/18/2011    Procedure: CARDIOVERSION;  Surgeon: [name], MD;  Location: [facility] OR;  Service: Cardiovascular;  Laterality: N/A;    Current Outpatient Prescriptions  Medication Sig Dispense Refill  . ALPRAZolam (XANAX) 0.5 MG tablet Take 0.5 mg by mouth 3 (three) times daily as  needed. For anxiety      . amiodarone (PACERONE) 200 MG tablet Take 1 tablet (200 mg total) by mouth daily.      . Calcium Carbonate-Vitamin D (CALCIUM 600 + D PO) Take 1 tablet by mouth 2 (two) times daily.      . cycloSPORINE (RESTASIS) 0.05 % ophthalmic emulsion Place 1 drop into both eyes 2 (two) times daily.       . dorzolamide-timolol (COSOPT) 22.3-6.8 MG/ML ophthalmic solution Place 1 drop into both eyes 2 (two) times daily.      Marland Kitchen ESTRACE VAGINAL 0.1 MG/GM vaginal cream Place 1 g vaginally 2 (two) times a week.       . Flaxseed, Linseed, 1000 MG CAPS Take 2 capsules by mouth daily.       . fluticasone (FLONASE) 50 MCG/ACT nasal spray Place 2 sprays into the nose as needed.       . furosemide (LASIX) 40 MG tablet Take 40 mg by mouth daily.      Marland Kitchen latanoprost (XALATAN) 0.005 % ophthalmic solution Place 1 drop into both eyes daily.       Marland Kitchen levothyroxine (SYNTHROID, LEVOTHROID) 88 MCG tablet Take 88 mcg by mouth daily.      Marland Kitchen MINIVELLE 0.075 MG/24HR Place 1 patch onto the skin 2 (two) times a week.       Marland Kitchen  Multiple Vitamins-Minerals (ICAPS) CAPS Take 1 capsule by mouth 2 (two) times daily.       . Omega-3 Fatty Acids (FISH OIL) 1200 MG CAPS Take 1 capsule by mouth 3 (three) times daily.       Marland Kitchen PARoxetine (PAXIL-CR) 37.5 MG 24 hr tablet Take 37.5 mg by mouth every morning.      . potassium chloride SA (K-DUR,KLOR-CON) 20 MEQ tablet Take 20 mEq by mouth daily.      . rosuvastatin (CRESTOR) 40 MG tablet Take 40 mg by mouth daily.      . traMADol (ULTRAM) 50 MG tablet Take 50 mg by mouth every 6 (six) hours as needed. For pain      . warfarin (COUMADIN) 5 MG tablet Take 2.5-5 mg by mouth daily. Take 2.5 by mouth a day except 5 mg on tuesday      . zolmitriptan (ZOMIG) 5 MG tablet Take 5 mg by mouth as needed. For migraines      . zolpidem (AMBIEN) 10 MG tablet Take 10 mg by mouth at bedtime as needed. For sleep        Allergies  Allergen Reactions  . Trazodone And Nefazodone Other (See  Comments)    Weakness.    History   Social History  . Marital Status: Married    Spouse Name: N/A    Number of Children: N/A  . Years of Education: N/A   Occupational History  . Not on file.   Social History Main Topics  . Smoking status: Former Smoker    Quit date: 07/14/1985  . Smokeless tobacco: Not on file  . Alcohol Use: 4.0 oz/week    8 drink(s) per week     wine 4 glasses/wk  . Drug Use: No  . Sexually Active: Not on file   Other Topics Concern  . Not on file   Social History Narrative   Lives in [place] with spouse.  No children.  Retired Child psychotherapist.    Family History  Problem Relation Age of Onset  . Stroke Father 70    MI ,DECEASED  . Transient ischemic attack Mother     DECEASED  . Macular degeneration Mother   . Hypertension Brother     ALIVE    ROS-  All systems are reviewed and are negative except as outlined in the HPI above   Physical Exam: Filed Vitals:   12/18/11 0859  BP: 106/56  Pulse: 42  Height: 5\' 4"  (1.626 m)  Weight: 147 lb (66.679 kg)    GEN- The patient is well appearing, alert and oriented x 3 today.   Head- normocephalic, atraumatic Eyes-  Sclera clear, conjunctiva pink Ears- hearing intact Oropharynx- clear Neck- supple, no JVP Lymph- no cervical lymphadenopathy Lungs- Clear to ausculation bilaterally, normal work of breathing Heart- Regular rate and rhythm, no murmurs, rubs or gallops, PMI not laterally displaced GI- soft, NT, ND, + BS Extremities- no clubbing, cyanosis, or edema MS- no significant deformity or atrophy Skin- no rash or lesion Psych- euthymic mood, full affect Neuro- strength and sensation are intact  ekg today reveals sinus bradycardia 42 bpm, PR 204, RBBB Echo from Dr Katrinka Blazing- 12/02/11 reveals EF 60-65%, no MR, trivial TR, moderate LA enlargement (49mm)  Assessment and Plan:

## 2012-01-14 ENCOUNTER — Ambulatory Visit (HOSPITAL_COMMUNITY): Payer: Medicare Other

## 2012-01-14 DIAGNOSIS — I498 Other specified cardiac arrhythmias: Secondary | ICD-10-CM

## 2012-01-14 LAB — PROTIME-INR: INR: 2.15 — ABNORMAL HIGH (ref 0.00–1.49)

## 2012-01-14 MED ORDER — WARFARIN SODIUM 5 MG PO TABS: ORAL_TABLET | ORAL | Status: DC

## 2012-01-14 NOTE — Progress Notes (Signed)
   ELECTROPHYSIOLOGY ROUNDING NOTE    Patient Name: Jo Hughes Date of Encounter: 01-14-2012    SUBJECTIVE:Patient feels well.  No chest pain or shortness of breath.  Moderate incisional soreness.  S/p PPM implant 01-13-2012. For tachybtrady syndrome  TELEMETRY: Reviewed telemetry pt in atrial pacing with intrinsic ventricular conduction Filed Vitals:   01/13/12 0938 01/13/12 1730 01/13/12 2048 01/14/12 0604  BP: 118/57 140/67 134/105 125/56  Pulse: 39 60 58 73  Temp: 97 F (36.1 C) 98.6 F (37 C) 97.5 F (36.4 C) 97.9 F (36.6 C)  TempSrc: Oral Oral Oral Oral  Resp: 18 20 18 18   Height: 5\' 4"  (1.626 m)     Weight: 147 lb (66.679 kg)   149 lb 11.2 oz (67.903 kg)  SpO2: 98% 94% 60% 93%    Intake/Output Summary (Last 24 hours) at 01/14/12 0724 Last data filed at 01/13/12 2211  Gross per 24 hour  Intake    123 ml  Output      0 ml  Net    123 ml   INR: 1.93 (01-13-2012)  Radiology/Studies:  Final result pending, leads in stable position.  PHYSICAL EXAM Left chest without hematoma or ecchymosis. Well developed and nourished in no acute distress HENT normal Neck supple vClear Regular rate and rhythm, no murmurs or gallops Abd-soft with active BS No Clubbing cyanosis edema Skin-warm and dry A & Oriented  Grossly normal sensory and motor function   DEVICE INTERROGATION: Device interrogated by industry.  Lead values including impedence, sensing, threshold within normal values.    Wound care, arm mobility, restrictions reviewed with patient.  Dr Katrinka Blazing requests that we do wound check appointment, then will turn pacemaker care over to [facility].  Will call Riki Rusk at [facility] to confirm Coumadin dosing before discharge.   Discharge plans as outlined  Sherryl Manges, MD 01/14/2012 7:59 AM

## 2012-01-14 NOTE — Discharge Summary (Signed)
ELECTROPHYSIOLOGY PROCEDURE DISCHARGE SUMMARY    Patient ID: Jo Hughes,  MRN: [id number], DOB/AGE: [birth year] 71 y.o.  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   Secondary Discharge Diagnosis:  1.  S/p mitral valve repair at the [facility] in 2005 2.  S/p tricuspid valve repair 3.  Hyperlipidemia 4.  Glaucoma 5.  Macular degeneration 6.  DJD 7.  Hypothyroid 8.  Atrial fibrillation and atrial flutter 9.  Chronic anticoagulation with Warfarin- managed by [facility]  Procedures This Admission:  1.  Implantation of a dual chamber pacemaker on 01-13-2012 by Dr Johney Frame.  The patient received a Medtronic model number [id number] pacemaker with model number 5076 right atrial lead and model number 5092 right ventricular lead.  There were no early apparent complications.  2.  CXR on 01-14-2012 demonstrated no pneumothorax status post pacemaker implantation.   Brief HPI: Jo Hughes was referred to Dr Johney Frame in the outpatient setting for evaluation of symptomatic bradycardia. She has a history of atrial fibrillation and atrial flutter and has undergone cardioversion to sinus by Dr Katrinka Blazing. She remains in sinus rhythm with frequent bradycardia. She reports symptoms of fatigue, breathlessness, and dizziness with her bradycardia. She had a holter monitor placed by Dr Katrinka Blazing which revealed frequent sinus bradycardia. She has had no afib or further atrial flutter. Because of bradycardia, pacemaker implantation was recommended.  Risks, benefits, and alternatives to the procedure were reviewed with the patient who wished to proceed.   Hospital Course:  The patient was admitted and underwent implantation of a dual chamber pacemaker with details as outlined above.   She was monitored on telemetry overnight which  demonstrated atrial pacing with intrinsic ventricular conduction.  Left chest was without hematoma or ecchymosis.  The device was interrogated and found to be functioning normally.  CXR was obtained and demonstrated no pneumothorax status post device implantation.  Wound care, arm mobility, and restrictions were reviewed with the patient.  Dr Graciela Husbands examined the patient and considered them stable for discharge to home.    Discharge Vitals: Blood pressure 125/56, pulse 73, temperature 97.9 F (36.6 C), temperature source Oral, resp. rate 18, height 5\' 4"  (1.626 m), weight 149 lb 11.2 oz (67.903 kg), SpO2 93.00%.    Labs:   Lab Results  Component Value Date   WBC 7.9 01/07/2012   HGB 14.3 01/07/2012   HCT 42.9 01/07/2012   MCV 89.6 01/07/2012   PLT 170.0 01/07/2012     Lab 01/07/12 1102  NA 139  K 4.5  CL 107  CO2 26  BUN 22  CREATININE 0.9  CALCIUM 9.7  PROT --  BILITOT --  ALKPHOS --  ALT --  AST --  GLUCOSE 95   INR- 2.15  Discharge Medications:  Medication List  As of 01/14/2012  9:22 AM   TAKE these medications         ALPRAZolam 0.5 MG tablet   Commonly known as: XANAX   Take 0.5 mg by mouth 3 (three) times daily as needed. For anxiety      amiodarone 200 MG tablet   Commonly known as: PACERONE   Take 1 tablet (200 mg total) by mouth daily.      CALCIUM 600 + D PO   Take 1 tablet by mouth daily.      cycloSPORINE 0.05 % ophthalmic emulsion  Commonly known as: RESTASIS   Place 1 drop into both eyes 2 (two) times daily.      dorzolamide-timolol 22.3-6.8 MG/ML ophthalmic solution   Commonly known as: COSOPT   Place 1 drop into both eyes 2 (two) times daily.      ESTRACE VAGINAL 0.1 MG/GM vaginal cream   Generic drug: estradiol   Place 1 g vaginally 2 (two) times a week.      Fish Oil 1200 MG Caps   Take 1,200 mg by mouth 2 (two) times daily.      Flaxseed (Linseed) 1000 MG Caps   Take 2,000 mg by mouth daily.      fluticasone 50 MCG/ACT nasal spray    Commonly known as: FLONASE   Place 2 sprays into the nose as needed.      furosemide 40 MG tablet   Commonly known as: LASIX   Take 40 mg by mouth daily.      ICAPS Caps   Take 1 capsule by mouth 2 (two) times daily.      latanoprost 0.005 % ophthalmic solution   Commonly known as: XALATAN   Place 1 drop into both eyes daily.      levothyroxine 88 MCG tablet   Commonly known as: SYNTHROID, LEVOTHROID   Take 88 mcg by mouth daily.      MINIVELLE 0.075 MG/24HR   Generic drug: estradiol   Place 1 patch onto the skin 2 (two) times a week.      PARoxetine 37.5 MG 24 hr tablet   Commonly known as: PAXIL-CR   Take 37.5 mg by mouth every morning.      potassium chloride SA 20 MEQ tablet   Commonly known as: K-DUR,KLOR-CON   Take 20 mEq by mouth daily.      rosuvastatin 40 MG tablet   Commonly known as: CRESTOR   Take 40 mg by mouth daily.      traMADol 50 MG tablet   Commonly known as: ULTRAM   Take 50 mg by mouth every 6 (six) hours as needed. For pain      warfarin 5 MG tablet   Commonly known as: COUMADIN   Take 1/2 tablet (2.5 mg total) by mouth once daily      zolmitriptan 5 MG tablet   Commonly known as: ZOMIG   Take 5 mg by mouth as needed. For migraines      zolpidem 10 MG tablet   Commonly known as: AMBIEN   Take 10 mg by mouth at bedtime as needed. For sleep            Disposition:  Discharge Orders    Future Appointments: Provider: Department: Dept Phone: Center:   01/26/2012 4:00 PM [facility] [phone] [facility]     Future Orders Please Complete By Expires   Diet - low sodium heart healthy      Increase activity slowly      Discharge instructions      Comments:   Please see post pacemaker discharge instructions     Follow-up Information    Follow up with [facility] on 01/26/2012. (At 4:00 PM for wound check)    Contact information:   [address]  [address] [place]  [address] [phone]       Follow up with [name], MD. [facility] - as scheduled)    Contact information:   [address] [place] [address] [place]  [place] [address] [phone]          Duration of Discharge Encounter: Greater than 30 minutes including physician time.  Signed, Gypsy Balsam, RN, BSN 01/14/2012, 9:22 AM   I have seen, examined the patient, and reviewed the above assessment and plan.  Changes to above are made where necessary.    Co Sign: Hillis Range, MD

## 2012-01-14 NOTE — Progress Notes (Signed)
Verbalized understanding of discharge instructions.  Assisted with dressing.  Booklet with card that has pacemaker numbers given to client.

## 2012-01-26 ENCOUNTER — Ambulatory Visit (INDEPENDENT_AMBULATORY_CARE_PROVIDER_SITE_OTHER): Payer: Medicare Other | Admitting: *Deleted

## 2012-01-26 ENCOUNTER — Encounter: Payer: Self-pay | Admitting: Internal Medicine

## 2012-01-26 DIAGNOSIS — R001 Bradycardia, unspecified: Secondary | ICD-10-CM

## 2012-01-26 DIAGNOSIS — I498 Other specified cardiac arrhythmias: Secondary | ICD-10-CM

## 2012-01-26 LAB — PACEMAKER DEVICE OBSERVATION
AL IMPEDENCE PM: 577 Ohm
AL THRESHOLD: 0.75 V
RV LEAD AMPLITUDE: 15.67 mv
RV LEAD IMPEDENCE PM: 683 Ohm
RV LEAD THRESHOLD: 1.5 V

## 2012-01-26 NOTE — Progress Notes (Signed)
Wound check ppm  

## 2012-02-02 ENCOUNTER — Encounter: Payer: Self-pay | Admitting: Internal Medicine

## 2012-04-26 ENCOUNTER — Encounter: Payer: Medicare Other | Admitting: Internal Medicine

## 2012-04-30 ENCOUNTER — Encounter: Payer: Self-pay | Admitting: Internal Medicine

## 2012-04-30 ENCOUNTER — Ambulatory Visit (INDEPENDENT_AMBULATORY_CARE_PROVIDER_SITE_OTHER): Payer: Medicare Other | Admitting: Internal Medicine

## 2012-04-30 VITALS — BP 104/80 | HR 86 | Ht 64.0 in | Wt 150.8 lb

## 2012-04-30 DIAGNOSIS — I4892 Unspecified atrial flutter: Secondary | ICD-10-CM

## 2012-04-30 DIAGNOSIS — I498 Other specified cardiac arrhythmias: Secondary | ICD-10-CM

## 2012-04-30 DIAGNOSIS — I484 Atypical atrial flutter: Secondary | ICD-10-CM

## 2012-04-30 DIAGNOSIS — I4891 Unspecified atrial fibrillation: Secondary | ICD-10-CM

## 2012-04-30 DIAGNOSIS — R001 Bradycardia, unspecified: Secondary | ICD-10-CM

## 2012-04-30 LAB — PACEMAKER DEVICE OBSERVATION
AL AMPLITUDE: 4 mv
BATTERY VOLTAGE: 2.78 V
RV LEAD AMPLITUDE: 22.4 mv
RV LEAD IMPEDENCE PM: 593 Ohm
VENTRICULAR PACING PM: 38

## 2012-04-30 NOTE — Patient Instructions (Addendum)
Your physician recommends that you schedule a follow-up appointment in: 4 weeks with Dr Allred.  

## 2012-05-02 ENCOUNTER — Encounter: Payer: Self-pay | Admitting: Internal Medicine

## 2012-05-02 NOTE — Progress Notes (Signed)
Primary Cardiologist: [name], MD  Jo Hughes is a 71 y.o. female who presents today for electrophysiology followup.  Since having her pacemaker implanted, the patient reports doing very well.  Today, she denies symptoms of palpitations, chest pain, shortness of breath,  lower extremity edema, dizziness, presyncope, or syncope.  The patient is otherwise without complaint today.   Past Medical History  Diagnosis Date  . Hx of atrial flutter july 2009    atypical atrial flutter  . H/O mitral valve repair [facility] [place].  . H/O tricuspid valve repair   . Hx of acquired endocarditis 1970; 1986    Prior to mitral valve repair   . Hyperlipidemia     cardiac cath clean coronary arties in the past.  . Glaucoma(365)   . Dry eyes   . Blepharitis   . Macular degeneration     early , [place]  . Goiter   . Hypothyroid   . Atrial fibrillation     persistent  . Symptomatic bradycardia     s/p PPM  . CHF (congestive heart failure)   . Pneumonia ~ 2003  . Exertional dyspnea     "have also been SOB at rest"  . Migraine headache     "used to have severe; not as much since hysterectomy"  . Lumbosacral disc disease   . 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 Arthritis     "knees"  . Anxiety   . Depression    Past Surgical History  Procedure Date  . Mitral valve repair 05/2004    [facility]  . Cardioversion ~ 2011; 09/18/2011    ?; Procedure: CARDIOVERSION;  Surgeon: [name], MD;  Location: [facility] OR;  Service: Cardiovascular;  Laterality: N/A;  . Pacemaker insertion 01/13/12    MDT Adapta L implanted by Dr Johney Frame  . Tonsillectomy and adenoidectomy 1948  . Abdominal hysterectomy 1986  . Knee cartilage surgery 02/2009    left; Dr. Luiz Blare  . Lumbar laminectomy 1997  . Tricuspid valve surgery 05/2004    "repair; [facility]"  . Back surgery   . Cataract extraction w/ intraocular lens  implant, bilateral ~ 2011    . Cardiac catheterization     "probably 3 so far" (01/13/12)    Current Outpatient Prescriptions  Medication Sig Dispense Refill  . ALPRAZolam (XANAX) 0.5 MG tablet Take 0.5 mg by mouth 3 (three) times daily as needed. For anxiety      . amiodarone (PACERONE) 200 MG tablet Take 1 tablet (200 mg total) by mouth daily.      . Calcium Carbonate-Vitamin D (CALCIUM 600 + D PO) Take 1 tablet by mouth daily.       . cycloSPORINE (RESTASIS) 0.05 % ophthalmic emulsion Place 1 drop into both eyes 2 (two) times daily.       . dorzolamide-timolol (COSOPT) 22.3-6.8 MG/ML ophthalmic solution Place 1 drop into both eyes 2 (two) times daily.      Marland Kitchen ESTRACE VAGINAL 0.1 MG/GM vaginal cream Place 1 g vaginally 2 (two) times a week.       . fluticasone (FLONASE) 50 MCG/ACT nasal spray Place 2 sprays into the nose as needed.       . furosemide (LASIX) 40 MG tablet Take 40 mg by mouth daily.      Marland Kitchen latanoprost (XALATAN) 0.005 % ophthalmic solution Place 1 drop into both eyes daily.       Marland Kitchen  levothyroxine (SYNTHROID, LEVOTHROID) 88 MCG tablet Take 88 mcg by mouth daily.      Marland Kitchen MINIVELLE 0.075 MG/24HR Place 1 patch onto the skin 2 (two) times a week.       . Multiple Vitamins-Minerals (ICAPS) CAPS Take 1 capsule by mouth 2 (two) times daily.       . Omega-3 Fatty Acids (FISH OIL) 1200 MG CAPS Take 1,000 mg by mouth 4 (four) times daily.       Marland Kitchen PARoxetine (PAXIL-CR) 37.5 MG 24 hr tablet Take 37.5 mg by mouth as needed.       . potassium chloride SA (K-DUR,KLOR-CON) 20 MEQ tablet Take 20 mEq by mouth daily.      . rosuvastatin (CRESTOR) 40 MG tablet Take 40 mg by mouth daily.      . traMADol (ULTRAM) 50 MG tablet Take 50 mg by mouth every 6 (six) hours as needed. For pain      . warfarin (COUMADIN) 5 MG tablet Take 1/2 tablet (2.5 mg total) by mouth once daily  30 tablet  1  . zolmitriptan (ZOMIG) 5 MG tablet Take 5 mg by mouth as needed. For migraines      . zolpidem (AMBIEN) 10 MG tablet Take 10 mg by mouth at  bedtime as needed. For sleep        Physical Exam: Filed Vitals:   04/30/12 1031  BP: 104/80  Pulse: 86  Height: 5\' 4"  (1.626 m)  Weight: 150 lb 12.8 oz (68.402 kg)  SpO2: 96%    GEN- The patient is well appearing, alert and oriented x 3 today.   Head- normocephalic, atraumatic Eyes-  Sclera clear, conjunctiva pink Ears- hearing intact Oropharynx- clear Lungs- Clear to ausculation bilaterally, normal work of breathing Chest- pacemaker pocket is well healed Heart- Regular rate and rhythm, no murmurs, rubs or gallops, PMI not laterally displaced GI- soft, NT, ND, + BS Extremities- no clubbing, cyanosis, or edema  Pacemaker interrogation- reviewed in detail today,  See PACEART report CXR 10/13 is reviewed and reveals stable pacemaker lead position  Assessment and Plan:  1. Bradycardia Normal pacemaker function See Pace Art report No changes today

## 2012-05-02 NOTE — Assessment & Plan Note (Addendum)
S/p PPM implanted for symptomatic sinus bradycardia  Interrogation today reveals normal pacemaker function.  Her RV lead threshold is mildly elevated but within acceptable parameters.  I have reviewed her CXR obtained by Dr Katrinka Blazing which reveals stable lead position.  She does not require V pacing.  I will therefore observe her pacemaker without plans for lead revision at this point.   I will ask Dr Katrinka Blazing to obtain an echo early next week just to make sure that she does not have a pericardial effusion, though clinically she has not symptoms of this.  I will see her again in 4 weeks to evaluate lead stability

## 2012-05-02 NOTE — Assessment & Plan Note (Signed)
She has had episodes of atrial tachycardia vs atrial flutter for which she is tracking and V pacing. I have therefore reprogrammed her device DDIR today.

## 2012-05-26 ENCOUNTER — Encounter: Payer: Self-pay | Admitting: *Deleted

## 2012-05-26 DIAGNOSIS — Z950 Presence of cardiac pacemaker: Secondary | ICD-10-CM | POA: Insufficient documentation

## 2012-06-04 ENCOUNTER — Encounter: Payer: Self-pay | Admitting: Internal Medicine

## 2012-06-04 ENCOUNTER — Ambulatory Visit (INDEPENDENT_AMBULATORY_CARE_PROVIDER_SITE_OTHER): Payer: Medicare Other | Admitting: Internal Medicine

## 2012-06-04 VITALS — BP 121/80 | HR 99 | Ht 64.0 in | Wt 151.1 lb

## 2012-06-04 DIAGNOSIS — R0989 Other specified symptoms and signs involving the circulatory and respiratory systems: Secondary | ICD-10-CM

## 2012-06-04 DIAGNOSIS — R001 Bradycardia, unspecified: Secondary | ICD-10-CM

## 2012-06-04 DIAGNOSIS — I498 Other specified cardiac arrhythmias: Secondary | ICD-10-CM

## 2012-06-04 DIAGNOSIS — I495 Sick sinus syndrome: Secondary | ICD-10-CM

## 2012-06-04 LAB — PACEMAKER DEVICE OBSERVATION
AL IMPEDENCE PM: 548 Ohm
ATRIAL PACING PM: 66
BATTERY VOLTAGE: 2.78 V

## 2012-06-04 NOTE — Patient Instructions (Signed)
Your physician recommends that you schedule a follow-up appointment as needed with Dr Johney Frame and regularly with Dr Katrinka Blazing

## 2012-06-06 NOTE — Progress Notes (Signed)
Primary Cardiologist: [name], MD  Jo Hughes is a 71 y.o. female who presents today for electrophysiology followup.  Since having her pacemaker implanted, the patient reports doing very well.  Today, she denies symptoms of palpitations, chest pain, shortness of breath,  lower extremity edema, dizziness, presyncope, or syncope.  The patient is otherwise without complaint today.   Past Medical History  Diagnosis Date  . Hx of atrial flutter july 2009    atypical atrial flutter  . H/O mitral valve repair [facility] [place].  . H/O tricuspid valve repair   . Hx of acquired endocarditis 1970; 1986    Prior to mitral valve repair   . Hyperlipidemia     cardiac cath clean coronary arties in the past.  . Glaucoma(365)   . Dry eyes   . Blepharitis   . Macular degeneration     early , [place]  . Goiter   . Hypothyroid   . Atrial fibrillation     persistent  . Symptomatic bradycardia     s/p PPM  . CHF (congestive heart failure)   . Pneumonia ~ 2003  . Exertional dyspnea     "have also been SOB at rest"  . Migraine headache     "used to have severe; not as much since hysterectomy"  . Lumbosacral disc disease   . 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 Arthritis     "knees"  . Anxiety   . Depression    Past Surgical History  Procedure Date  . Mitral valve repair 05/2004    [facility]  . Cardioversion ~ 2011; 09/18/2011    ?; Procedure: CARDIOVERSION;  Surgeon: [name], MD;  Location: [facility] OR;  Service: Cardiovascular;  Laterality: N/A;  . Pacemaker insertion 01/13/12    MDT Adapta L implanted by Dr Johney Frame  . Tonsillectomy and adenoidectomy 1948  . Abdominal hysterectomy 1986  . Knee cartilage surgery 02/2009    left; Dr. Luiz Blare  . Lumbar laminectomy 1997  . Tricuspid valve surgery 05/2004    "repair; [facility]"  . Back surgery   . Cataract extraction w/ intraocular lens  implant, bilateral ~ 2011    . Cardiac catheterization     "probably 3 so far" (01/13/12)    Current Outpatient Prescriptions  Medication Sig Dispense Refill  . ALPRAZolam (XANAX) 0.5 MG tablet Take 0.5 mg by mouth 3 (three) times daily as needed. For anxiety      . amiodarone (PACERONE) 200 MG tablet Take 1 tablet (200 mg total) by mouth daily.      . Calcium Carbonate-Vitamin D (CALCIUM 600 + D PO) Take 1 tablet by mouth daily.       . cycloSPORINE (RESTASIS) 0.05 % ophthalmic emulsion Place 1 drop into both eyes 2 (two) times daily.       . dorzolamide-timolol (COSOPT) 22.3-6.8 MG/ML ophthalmic solution Place 1 drop into both eyes 2 (two) times daily.      Marland Kitchen ESTRACE VAGINAL 0.1 MG/GM vaginal cream Place 1 g vaginally 2 (two) times a week.       . fluticasone (FLONASE) 50 MCG/ACT nasal spray Place 2 sprays into the nose as needed.       . furosemide (LASIX) 40 MG tablet Take 40 mg by mouth daily.      Marland Kitchen latanoprost (XALATAN) 0.005 % ophthalmic solution Place 1 drop into both eyes daily.       Marland Kitchen  levothyroxine (SYNTHROID, LEVOTHROID) 88 MCG tablet Take 88 mcg by mouth daily.      Marland Kitchen MINIVELLE 0.075 MG/24HR Place 1 patch onto the skin 2 (two) times a week.       . Multiple Vitamins-Minerals (ICAPS) CAPS Take 1 capsule by mouth 2 (two) times daily.       . Omega-3 Fatty Acids (FISH OIL) 1200 MG CAPS Take 1,000 mg by mouth 4 (four) times daily.       Marland Kitchen PARoxetine (PAXIL-CR) 37.5 MG 24 hr tablet Take 37.5 mg by mouth as needed.       . potassium chloride SA (K-DUR,KLOR-CON) 20 MEQ tablet Take 20 mEq by mouth daily.      . rosuvastatin (CRESTOR) 40 MG tablet Take 40 mg by mouth daily.      . traMADol (ULTRAM) 50 MG tablet Take 50 mg by mouth every 6 (six) hours as needed. For pain      . warfarin (COUMADIN) 5 MG tablet Take 1/2 tablet (2.5 mg total) by mouth once daily  30 tablet  1  . zolmitriptan (ZOMIG) 5 MG tablet Take 5 mg by mouth as needed. For migraines      . zolpidem (AMBIEN) 10 MG tablet Take 10 mg by mouth at  bedtime as needed. For sleep        Physical Exam: Filed Vitals:   06/04/12 1200  BP: 121/80  Pulse: 99  Height: 5\' 4"  (1.626 m)  Weight: 151 lb 1.9 oz (68.548 kg)  SpO2: 97%    GEN- The patient is well appearing, alert and oriented x 3 today.   Head- normocephalic, atraumatic Eyes-  Sclera clear, conjunctiva pink Ears- hearing intact Oropharynx- clear Lungs- Clear to ausculation bilaterally, normal work of breathing Chest- pacemaker pocket is well healed Heart- Regular rate and rhythm, no murmurs, rubs or gallops, PMI not laterally displaced GI- soft, NT, ND, + BS Extremities- no clubbing, cyanosis, or edema  Pacemaker interrogation- reviewed in detail today,  See PACEART report  Assessment and Plan:  1. Bradycardia Normal pacemaker function with stable RV lead threshold No further EP evaluation planned See Pace Art report No changes today  I will return her care to Dr Katrinka Blazing.  She will return as needed.

## 2012-08-10 ENCOUNTER — Other Ambulatory Visit: Payer: Self-pay | Admitting: Gynecology

## 2012-08-10 DIAGNOSIS — Z1231 Encounter for screening mammogram for malignant neoplasm of breast: Secondary | ICD-10-CM

## 2012-09-09 ENCOUNTER — Ambulatory Visit
Admission: RE | Admit: 2012-09-09 | Discharge: 2012-09-09 | Disposition: A | Discharge status: Home / Self Care | Payer: Medicare Other | Source: Ambulatory Visit | Attending: Gynecology | Admitting: Gynecology

## 2012-09-09 DIAGNOSIS — Z1231 Encounter for screening mammogram for malignant neoplasm of breast: Secondary | ICD-10-CM

## 2012-09-20 ENCOUNTER — Other Ambulatory Visit: Payer: Self-pay | Admitting: Internal Medicine

## 2012-09-20 DIAGNOSIS — M545 Low back pain: Secondary | ICD-10-CM

## 2012-09-22 ENCOUNTER — Ambulatory Visit
Admission: RE | Admit: 2012-09-22 | Discharge: 2012-09-22 | Disposition: A | Discharge status: Home / Self Care | Payer: Medicare Other | Source: Ambulatory Visit | Attending: Internal Medicine | Admitting: Internal Medicine

## 2012-09-22 DIAGNOSIS — M545 Low back pain: Secondary | ICD-10-CM

## 2013-04-09 ENCOUNTER — Other Ambulatory Visit: Payer: Self-pay | Admitting: Interventional Cardiology

## 2013-04-09 DIAGNOSIS — I4891 Unspecified atrial fibrillation: Secondary | ICD-10-CM

## 2013-04-20 ENCOUNTER — Ambulatory Visit (INDEPENDENT_AMBULATORY_CARE_PROVIDER_SITE_OTHER): Payer: Medicare Other | Admitting: *Deleted

## 2013-04-20 ENCOUNTER — Ambulatory Visit (INDEPENDENT_AMBULATORY_CARE_PROVIDER_SITE_OTHER): Payer: Medicare Other | Admitting: Interventional Cardiology

## 2013-04-20 ENCOUNTER — Ambulatory Visit (INDEPENDENT_AMBULATORY_CARE_PROVIDER_SITE_OTHER): Payer: Medicare Other | Admitting: Pharmacist

## 2013-04-20 ENCOUNTER — Encounter: Payer: Self-pay | Admitting: Interventional Cardiology

## 2013-04-20 VITALS — BP 120/80 | HR 69 | Ht 64.0 in | Wt 150.0 lb

## 2013-04-20 DIAGNOSIS — I4891 Unspecified atrial fibrillation: Secondary | ICD-10-CM

## 2013-04-20 DIAGNOSIS — I484 Atypical atrial flutter: Secondary | ICD-10-CM

## 2013-04-20 DIAGNOSIS — Z950 Presence of cardiac pacemaker: Secondary | ICD-10-CM

## 2013-04-20 DIAGNOSIS — I4892 Unspecified atrial flutter: Secondary | ICD-10-CM

## 2013-04-20 DIAGNOSIS — I38 Endocarditis, valve unspecified: Secondary | ICD-10-CM

## 2013-04-20 LAB — POCT INR: INR: 4.5

## 2013-04-20 MED ORDER — AMIODARONE HCL 200 MG PO TABS: 400.0000 mg | ORAL_TABLET | Freq: Every day | ORAL | Status: DC

## 2013-04-20 NOTE — Progress Notes (Signed)
Patient ID: Jo Hughes, female   DOB: [birth year], 72 y.o.   MRN: [id number]    HPI  For 3-6 months. She has had decreased energy. Review of pacemaker records demonstrate mode switching and supraventricular tachycardia. Today's EKG demonstrates atrial flutter with variable response. Previously, this arrhythmia has caused the same symptoms. The atypical flutter has prevented ablation. She is seen. Both Dr. Johney Frame, and Dr. Graciela Husbands. She [facility] had [facility]. Her background history is that of rheumatic heart disease with mitral valve repair at the [facility] clinic by Dr. Elsie Ra. This was done over 10 years ago. In atrial flutter she develops acute diastolic heart failure, which is currently present. She denies lower extremity edema. Her symptoms, predominantly exertional dyspnea.  Allergies  Allergen Reactions  . Trazodone And Nefazodone Other (See Comments)    Weakness.    Current Outpatient Prescriptions  Medication Sig Dispense Refill  . ALPRAZolam (XANAX) 0.5 MG tablet Take 0.5 mg by mouth 3 (three) times daily as needed. For anxiety      . amiodarone (PACERONE) 200 MG tablet Take 1 tablet (200 mg total) by mouth daily.      . Calcium Carbonate-Vitamin D (CALCIUM 600 + D PO) Take 1 tablet by mouth daily.       . cycloSPORINE (RESTASIS) 0.05 % ophthalmic emulsion Place 1 drop into both eyes 2 (two) times daily.       . dorzolamide-timolol (COSOPT) 22.3-6.8 MG/ML ophthalmic solution Place 1 drop into both eyes 2 (two) times daily.      . furosemide (LASIX) 40 MG tablet Take 40 mg by mouth daily.      Marland Kitchen latanoprost (XALATAN) 0.005 % ophthalmic solution Place 1 drop into both eyes daily.       Marland Kitchen levothyroxine (SYNTHROID, LEVOTHROID) 88 MCG tablet Take 88 mcg by mouth daily.      Marland Kitchen MINIVELLE 0.075 MG/24HR Place 1 patch onto the skin 2 (two) times a week.       . Multiple Vitamins-Minerals (ICAPS) CAPS Take 1 capsule by mouth 2 (two) times daily.       . Omega-3  Fatty Acids (FISH OIL) 1200 MG CAPS Take 1,000 mg by mouth 4 (four) times daily.       Marland Kitchen PARoxetine (PAXIL-CR) 37.5 MG 24 hr tablet Take 37.5 mg by mouth as needed.       . potassium chloride SA (K-DUR,KLOR-CON) 20 MEQ tablet Take 20 mEq by mouth daily.      . rosuvastatin (CRESTOR) 40 MG tablet Take 40 mg by mouth daily.      . traMADol (ULTRAM) 50 MG tablet Take 50 mg by mouth every 6 (six) hours as needed. For pain      . warfarin (COUMADIN) 5 MG tablet Take 1/2 tablet (2.5 mg total) by mouth once daily  30 tablet  1  . zolmitriptan (ZOMIG) 5 MG tablet Take 5 mg by mouth as needed. For migraines      . zolpidem (AMBIEN) 10 MG tablet Take 10 mg by mouth at bedtime as needed. For sleep      . ZIOPTAN 0.0015 % SOLN        No current facility-administered medications for this visit.    Past Medical History  Diagnosis Date  . Hx of atrial flutter july 2009    atypical atrial flutter  . H/O mitral valve repair [facility] [place].  . H/O tricuspid valve repair   .  Hx of acquired endocarditis 1970; 1986    Prior to mitral valve repair   . Hyperlipidemia     cardiac cath clean coronary arties in the past.  . Glaucoma   . Dry eyes   . Blepharitis   . Macular degeneration     early , [facility]  . Goiter   . Hypothyroid   . Atrial fibrillation     persistent  . Symptomatic bradycardia     s/p PPM  . CHF (congestive heart failure)   . Pneumonia ~ 2003  . Exertional dyspnea     "have also been SOB at rest"  . Migraine headache     "used to have severe; not as much since hysterectomy"  . Lumbosacral disc disease   . 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 Arthritis     "knees"  . Anxiety   . Depression     Past Surgical History  Procedure Laterality Date  . Mitral valve repair  05/2004    [facility]  . Cardioversion  ~ 2011; 09/18/2011    ?; Procedure: CARDIOVERSION;  Surgeon: [name], MD;  Location: [facility] OR;  Service:  Cardiovascular;  Laterality: N/A;  . Pacemaker insertion  01/13/12    MDT Adapta L implanted by Dr Johney Frame  . Tonsillectomy and adenoidectomy  1948  . Abdominal hysterectomy  1986  . Knee cartilage surgery  02/2009    left; Dr. Luiz Blare  . Lumbar laminectomy  1997  . Tricuspid valve surgery  05/2004    "repair; [facility]"  . Back surgery    . Cataract extraction w/ intraocular lens  implant, bilateral  ~ 2011  . Cardiac catheterization      "probably 3 so far" (01/13/12)    ROS: She denies syncope, edema, transient neurological symptoms, bleeding, on Coumadin, headache, chills, fever, hemoptysis, and falls.  PHYSICAL EXAM BP 120/80  Pulse 69  Ht 5\' 4"  (1.626 m)  Wt 150 lb (68.04 kg)  BMI 25.73 kg/m2  Skin is pink. No nail bed, cyanosis. Moderate JVD is noted at 30. No carotid bruits Chest reveals decreased breath sounds left greater than right base Irregular rhythm. No murmur is heard. Abdomen is soft. Bowel sounds are normal. No tenderness. No peripheral edema is noted. Neurological exam is intact.  EKG: Atrial flutter with controlled ventricular rate, right bundle branch block.  ASSESSMENT AND PLAN  1. Recurrent atrial flutter precipitating acute diastolic heart failure. Our plan will be to increase amiodarone to 400 mg per day. We'll plan elective cardioversion in 7-10 days.  2. Chronic amiodarone therapy for suppression of atrial flutter and fibrillation. As noted above the dose to be transiently increased.  3. Acute on chronic diastolic heart failure, aggravated by atrial flutter.  4. Chronic anticoagulation therapy. Will arrange for more intensive followup in anticoagulation clinic.prior to the procedure.   Overall, the patient is doing relatively well, but is in diastolic heart failure due to atrial flutter. She has been deemed a poor candidate for flutter ablation in [place]. We will plan electrical cardioversion and then may refer to [facility], Dr. Macon Large for consideration of flutter is the cardioversion is unsuccessful or she continues to have recurrences.

## 2013-04-20 NOTE — Patient Instructions (Addendum)
Increase Amiodarone to 400mg  daily  Your physician has recommended that you have a Cardioversion (DCCV). Electrical Cardioversion uses a jolt of electricity to your heart either through paddles or wired patches attached to your chest. This is a controlled, usually prescheduled, procedure. Defibrillation is done under light anesthesia in the hospital, and you usually go home the day of the procedure. This is done to get your heart back into a normal rhythm. You are not awake for the procedure. Please see the instruction sheet given to you today.  Your cardioversion is scheduled for 04/29/13 @12pm  with Dr.Smith  Electrical Cardioversion Cardioversion is the delivery of a jolt of electricity to change the rhythm of the heart. Sticky patches or metal paddles are placed on the chest to deliver the electricity from a special device. This is done to restore a normal rhythm. A rhythm that is too fast or not regular keeps the heart from pumping well. Compared to medicines used to change an abnormal rhythm, cardioversion is faster and works better. It is also unpleasant and may dislodge blood clots from the heart. WHEN WOULD THIS BE DONE?  In an emergency:  There is low or no blood pressure as a result of the heart rhythm.  Normal rhythm must be restored as fast as possible to protect the brain and heart from further damage.  It may save a life.  For less serious heart rhythms, such as atrial fibrillation or flutter, in which:  The heart is beating too fast or is not regular.  The heart is still able to pump enough blood, but not as well as it should.  Medicine to change the rhythm has not worked.  It is safe to wait in order to allow time for preparation. LET YOUR CAREGIVER KNOW ABOUT:   Every medicine you are taking. It is very important to do this! Know when to take or stop taking any of them.  Any time in the past that you have felt your heart was not beating normally. RISKS AND  COMPLICATIONS   Clots may form in the chambers of the heart if it is beating too fast. These clots may be dislodged during the procedure and travel to other parts of the body.  There is risk of a stroke during and after the procedure if a clot moves. Blood thinners lower this risk.  You may have a special test of your heart (TEE) to make sure there are no clots in your heart. BEFORE THE PROCEDURE   You may have some tests to see how well your heart is working.  You may start taking blood thinners so your blood does not clot as easily.  Other drugs may be given to help your heart work better. PROCEDURE (SCHEDULED)  The procedure is typically done in a hospital by a heart doctor (cardiologist).  You will be told when and where to go.  You may be given some medicine through an intravenous (IV) access to reduce discomfort and make you sleepy before the procedure.  Your whole body may move when the shock is delivered. Your chest may feel sore.  You may be able to go home after a few hours. Your heart rhythm will be watched to make sure it does not change. HOME CARE INSTRUCTIONS   Only take medicine as directed by your caregiver. Be sure you understand how and when to take your medicine.  Learn how to feel your pulse and check it often.  Limit your activity for 48 hours.  Avoid caffeine and other stimulants as directed. SEEK MEDICAL CARE IF:   You feel like your heart is beating too fast or your pulse is not regular.  You have any questions about your medicines.  You have bleeding that will not stop. SEEK IMMEDIATE MEDICAL CARE IF:   You are dizzy or feel faint.  It is hard to breathe or you feel short of breath.  There is a change in discomfort in your chest.  Your speech is slurred or you have trouble moving your arm or leg on one side.  You get a muscle cramp.  Your fingers or toes turn cold or blue. MAKE SURE YOU:   Understand these instructions.  Will watch  your condition.  Will get help right away if you are not doing well or get worse. Document Released: 06/20/2002 Document Revised: 09/22/2011 Document Reviewed: 10/20/2007 [facility] Patient Information 2014 [place], [place].

## 2013-04-21 LAB — TSH: TSH: 2.69 u[IU]/mL (ref 0.35–5.50)

## 2013-04-21 LAB — HEPATIC FUNCTION PANEL: Total Bilirubin: 0.7 mg/dL (ref 0.3–1.2)

## 2013-04-22 ENCOUNTER — Telehealth: Payer: Self-pay

## 2013-04-22 DIAGNOSIS — Z79899 Other long term (current) drug therapy: Secondary | ICD-10-CM

## 2013-04-22 NOTE — Telephone Encounter (Signed)
Pt given lab results. Pt instructed that she will need a chest xray and GI will call to schedule

## 2013-04-22 NOTE — Telephone Encounter (Signed)
Message copied by Jarvis Newcomer on Fri Apr 22, 2013  9:32 AM ------      Message from: Verdis Prime      Created: Thu Apr 21, 2013  3:00 PM       The thyroid is normal. Liver panel is okay. Elevated liver enzymes are related to heart failure from rhythm disturbance. This will get better after heart is back and rhythm ------

## 2013-04-25 ENCOUNTER — Ambulatory Visit (INDEPENDENT_AMBULATORY_CARE_PROVIDER_SITE_OTHER): Payer: Medicare Other | Admitting: Pharmacist

## 2013-04-25 DIAGNOSIS — I4891 Unspecified atrial fibrillation: Secondary | ICD-10-CM

## 2013-04-25 LAB — POCT INR: INR: 2.6

## 2013-04-28 ENCOUNTER — Telehealth: Payer: Self-pay

## 2013-04-28 ENCOUNTER — Ambulatory Visit
Admission: RE | Admit: 2013-04-28 | Discharge: 2013-04-28 | Disposition: A | Discharge status: Home / Self Care | Payer: Medicare Other | Source: Ambulatory Visit | Attending: Interventional Cardiology | Admitting: Interventional Cardiology

## 2013-04-28 DIAGNOSIS — Z79899 Other long term (current) drug therapy: Secondary | ICD-10-CM

## 2013-04-28 NOTE — Telephone Encounter (Signed)
pt given chest xray results.pt verbalized understanding

## 2013-04-28 NOTE — Telephone Encounter (Signed)
Message copied by Jarvis Newcomer on Thu Apr 28, 2013  1:46 PM ------      Message from: Verdis Prime      Created: Thu Apr 28, 2013  1:32 PM       No significant abnormality noted. ------

## 2013-04-29 ENCOUNTER — Ambulatory Visit (HOSPITAL_COMMUNITY)
Admission: RE | Admit: 2013-04-29 | Discharge: 2013-04-29 | Disposition: A | Discharge status: Home / Self Care | Payer: Medicare Other | Source: Ambulatory Visit | Attending: Interventional Cardiology | Admitting: Interventional Cardiology

## 2013-04-29 ENCOUNTER — Encounter (HOSPITAL_COMMUNITY)
Admission: RE | Disposition: A | Discharge status: Home / Self Care | Payer: Medicare Other | Source: Ambulatory Visit | Attending: Interventional Cardiology

## 2013-04-29 ENCOUNTER — Encounter (HOSPITAL_COMMUNITY): Payer: Self-pay | Admitting: Critical Care Medicine

## 2013-04-29 ENCOUNTER — Encounter (HOSPITAL_COMMUNITY): Payer: Medicare Other | Admitting: Critical Care Medicine

## 2013-04-29 ENCOUNTER — Ambulatory Visit (HOSPITAL_COMMUNITY): Payer: Medicare Other | Admitting: Critical Care Medicine

## 2013-04-29 DIAGNOSIS — I509 Heart failure, unspecified: Secondary | ICD-10-CM | POA: Insufficient documentation

## 2013-04-29 DIAGNOSIS — I4892 Unspecified atrial flutter: Secondary | ICD-10-CM | POA: Insufficient documentation

## 2013-04-29 DIAGNOSIS — Z950 Presence of cardiac pacemaker: Secondary | ICD-10-CM | POA: Insufficient documentation

## 2013-04-29 DIAGNOSIS — I484 Atypical atrial flutter: Secondary | ICD-10-CM

## 2013-04-29 HISTORY — PX: CARDIOVERSION: SHX1299

## 2013-04-29 LAB — BASIC METABOLIC PANEL
BUN: 17 mg/dL (ref 6–23)
Calcium: 10.1 mg/dL (ref 8.4–10.5)
Chloride: 99 mEq/L (ref 96–112)
Creatinine, Ser: 0.81 mg/dL (ref 0.50–1.10)
GFR calc Af Amer: 83 mL/min — ABNORMAL LOW (ref >90)
GFR calc non Af Amer: 72 mL/min — ABNORMAL LOW (ref >90)
Glucose, Bld: 115 mg/dL — ABNORMAL HIGH (ref 70–99)

## 2013-04-29 SURGERY — CARDIOVERSION
Anesthesia: General | Wound class: Clean

## 2013-04-29 MED ORDER — SODIUM CHLORIDE 0.9 % IV SOLN: 250.0000 mL | INTRAVENOUS | Status: DC

## 2013-04-29 MED ORDER — LIDOCAINE HCL (CARDIAC) 20 MG/ML IV SOLN
INTRAVENOUS | Status: DC | PRN
  Administered 2013-04-29: 60 mg via INTRAVENOUS

## 2013-04-29 MED ORDER — SODIUM CHLORIDE 0.9 % IJ SOLN: 3.0000 mL | Freq: Two times a day (BID) | INTRAMUSCULAR | Status: DC

## 2013-04-29 MED ORDER — HYDROCORTISONE 1 % EX CREA: 1.0000 "application " | TOPICAL_CREAM | Freq: Three times a day (TID) | CUTANEOUS | Status: DC | PRN

## 2013-04-29 MED ORDER — SODIUM CHLORIDE 0.9 % IJ SOLN: 3.0000 mL | INTRAMUSCULAR | Status: DC | PRN

## 2013-04-29 MED ORDER — SODIUM CHLORIDE 0.9 % IV SOLN
INTRAVENOUS | Status: DC | PRN
  Administered 2013-04-29: 11:00:00 via INTRAVENOUS

## 2013-04-29 MED ORDER — AMIODARONE HCL 200 MG PO TABS: 200.0000 mg | ORAL_TABLET | Freq: Every day | ORAL | Status: DC

## 2013-04-29 MED ORDER — PROPOFOL 10 MG/ML IV BOLUS
INTRAVENOUS | Status: DC | PRN
  Administered 2013-04-29: 70 mg via INTRAVENOUS

## 2013-04-29 MED ORDER — HYDROCORTISONE 1 % EX CREA
1.0000 "application " | TOPICAL_CREAM | Freq: Three times a day (TID) | CUTANEOUS | Status: DC | PRN
  Filled 2013-04-29: qty 28

## 2013-04-29 NOTE — Anesthesia Preprocedure Evaluation (Addendum)
Anesthesia Evaluation  Patient identified by MRN, date of birth, ID band Patient awake    Reviewed: Allergy & Precautions, H&P , NPO status , Patient's Chart, lab work & pertinent test results  Airway Mallampati: II TM Distance: >3 FB Neck ROM: Full    Dental  (+) Dental Advisory Given and Teeth Intact   Pulmonary shortness of breath, with exertion and at rest,          Cardiovascular +CHF + dysrhythmias Atrial Fibrillation + pacemaker     Neuro/Psych  Headaches, Anxiety Depression    GI/Hepatic   Endo/Other  Hypothyroidism   Renal/GU      Musculoskeletal  (+) Arthritis -,   Abdominal   Peds  Hematology   Anesthesia Other Findings   Reproductive/Obstetrics                          Anesthesia Physical Anesthesia Plan  ASA: III  Anesthesia Plan: General   Post-op Pain Management:    Induction: Intravenous  Airway Management Planned: Mask  Additional Equipment:   Intra-op Plan:   Post-operative Plan:   Informed Consent: I have reviewed the patients History and Physical, chart, labs and discussed the procedure including the risks, benefits and alternatives for the proposed anesthesia with the patient or authorized representative who has indicated his/her understanding and acceptance.   Dental advisory given  Plan Discussed with: Anesthesiologist and Surgeon  Anesthesia Plan Comments:         Anesthesia Quick Evaluation

## 2013-04-29 NOTE — CV Procedure (Signed)
Electrical Cardioversion Procedure Note Jo Hughes [id number] 1940/10/01  Procedure: Electrical Cardioversion Indications:  Atrial Flutter  Time Out: Verified patient identification, verified procedure,medications/allergies/relevent history reviewed, required imaging and test results available.  Performed  Procedure Details  The patient was NPO after midnight. Anesthesia was administered at the beside  by Dr.Edwards with 75mg  of propofol.  Cardioversion was done with synchronized biphasic defibrillation with AP pads with 200watts.  The patient converted to normal sinus rhythm. The patient tolerated the procedure well   IMPRESSION:  Successful cardioversion of atrial flutter to NSR.    Jo Hughes 04/29/2013, 12:43 PM

## 2013-04-29 NOTE — Preoperative (Signed)
Beta Blockers   Reason not to administer Beta Blockers:Not Applicable 

## 2013-04-29 NOTE — Anesthesia Postprocedure Evaluation (Signed)
  Anesthesia Post-op Note  Patient: Jo Hughes  Procedure(s) Performed: Procedure(s): CARDIOVERSION (N/A)  Patient Location: PACU and Short Stay  Anesthesia Type:MAC  Level of Consciousness: awake  Airway and Oxygen Therapy: Patient Spontanous Breathing  Post-op Pain: mild  Post-op Assessment: Post-op Vital signs reviewed  Post-op Vital Signs: Reviewed  Complications: No apparent anesthesia complications

## 2013-04-29 NOTE — Anesthesia Postprocedure Evaluation (Signed)
  Anesthesia Post-op Note  Patient: Jo Hughes  Procedure(s) Performed: Procedure(s): CARDIOVERSION (N/A)  Patient Location: PACU and Endoscopy Unit  Anesthesia Type:General  Level of Consciousness: awake, alert  and oriented  Airway and Oxygen Therapy: Patient Spontanous Breathing and Patient connected to nasal cannula oxygen  Post-op Pain: none  Post-op Assessment: Post-op Vital signs reviewed, Patient's Cardiovascular Status Stable, Respiratory Function Stable, Patent Airway and No signs of Nausea or vomiting  Post-op Vital Signs: Reviewed and stable  Complications: No apparent anesthesia complications

## 2013-04-29 NOTE — H&P (Signed)
Ms. Jo Hughes has a long-standing history of mitral valve disease with mitral valve repair at the [facility] greater than 15 years ago. More recently she has been troubled by recurrent atrial flutter, atypical. Over the 3-6 months prior to admission she has noticed decreased energy and dyspnea on exertion. During a recent office visit EKG demonstrated atrial flutter with controlled ventricular response. She has a history of atrial flutter and prior cardioversion. Restoring sinus rhythm significantly improved her quality of life. She is here today to undergo repeat cardioversion. The procedure and risks including mechanical injury, skin burn, stroke, death, heart attack, heart failure, have been discussed in detail with the patient and accepted . Please refer to the recent office note for details of her past history.

## 2013-04-29 NOTE — Transfer of Care (Signed)
Immediate Anesthesia Transfer of Care Note  Patient: Jo Hughes  Procedure(s) Performed: Procedure(s): CARDIOVERSION (N/A)  Patient Location: PACU and Endoscopy Unit  Anesthesia Type:General  Level of Consciousness: awake, alert  and oriented  Airway & Oxygen Therapy: Patient Spontanous Breathing and Patient connected to nasal cannula oxygen  Post-op Assessment: Report given to PACU RN, Post -op Vital signs reviewed and stable and Patient moving all extremities X 4  Post vital signs: Reviewed and stable  Complications: No apparent anesthesia complications

## 2013-05-02 ENCOUNTER — Encounter (HOSPITAL_COMMUNITY): Payer: Self-pay | Admitting: Interventional Cardiology

## 2013-05-12 ENCOUNTER — Ambulatory Visit (INDEPENDENT_AMBULATORY_CARE_PROVIDER_SITE_OTHER): Payer: Medicare Other | Admitting: Pharmacist

## 2013-05-12 DIAGNOSIS — I4891 Unspecified atrial fibrillation: Secondary | ICD-10-CM

## 2013-05-12 LAB — POCT INR: INR: 2.8

## 2013-05-17 ENCOUNTER — Ambulatory Visit (INDEPENDENT_AMBULATORY_CARE_PROVIDER_SITE_OTHER): Payer: Medicare Other | Admitting: Interventional Cardiology

## 2013-05-17 ENCOUNTER — Encounter: Payer: Self-pay | Admitting: Interventional Cardiology

## 2013-05-17 VITALS — BP 122/76 | HR 84 | Ht 64.0 in | Wt 155.0 lb

## 2013-05-17 DIAGNOSIS — Z9889 Other specified postprocedural states: Secondary | ICD-10-CM

## 2013-05-17 DIAGNOSIS — I484 Atypical atrial flutter: Secondary | ICD-10-CM

## 2013-05-17 DIAGNOSIS — Z950 Presence of cardiac pacemaker: Secondary | ICD-10-CM

## 2013-05-17 DIAGNOSIS — I5032 Chronic diastolic (congestive) heart failure: Secondary | ICD-10-CM

## 2013-05-17 DIAGNOSIS — I4891 Unspecified atrial fibrillation: Secondary | ICD-10-CM

## 2013-05-17 DIAGNOSIS — I4892 Unspecified atrial flutter: Secondary | ICD-10-CM

## 2013-05-17 NOTE — Progress Notes (Signed)
Patient ID: Jo Hughes, female   DOB: 08-22-1940, 72 y.o.   MRN: [id number] ............Marland Kitchen    [address] [place], [place]  [address] Phone: [phone] Fax:  [phone]  Date:  05/17/2013   ID:  Jo Hughes, DOB 12/22/1940, MRN [id number]  PCP:  Lillia Mountain, MD   ASSESSMENT:  1. Recurrent atypical atrial flutter, aggravating tendency towards diastolic heart failure 2. Chronic diastolic heart failure, aggravated by the recurrence of atrial flutter. Manifestations include exertional dyspnea and fatigue 3. Chronic amiodarone therapy for maintenance of rhythm, now failed x3 4. Chronic anticoagulation therapy 5. Status post mitral valve repair, remote without evidence of significant regurgitation  PLAN:  1. Discontinue amiodarone, and use alternative therapies for rate control. This decision was made after conversation with the patient and in speaking with Dr. Ladona Ridgel. She has been seen in the past by both Dr. Graciela Husbands and Dr. Johney Frame and neither has felt that ablation of atypical flutter would be easy or very effective. 2. Rate control strategy after long discussion with patient 3. continue chronic anticoagulation therapy 4. Clinical followup in 3 weeks to initiate alternative therapies for rate control if needed off amiodarone SUBJECTIVE: Jo Hughes is a 72 y.o. female is following up after recent cardioversion. EKG today demonstrates recurrence of atrial flutter. She has exertional fatigue and dyspnea, noticeable over the past 3-4 days. He was improved after cardioversion. She denies palpitations. No chest discomfort. She denies orthopnea and edema.   Wt Readings from Last 3 Encounters:  05/17/13 155 lb (70.308 kg)  04/20/13 150 lb (68.04 kg)  06/04/12 151 lb 1.9 oz (68.548 kg)     Past Medical History  Diagnosis Date  . Hx of atrial flutter july 2009    atypical atrial flutter  . H/O mitral valve repair [facility] [place].  . H/O  tricuspid valve repair   . Hx of acquired endocarditis 1970; 1986    Prior to mitral valve repair   . Hyperlipidemia     cardiac cath clean coronary arties in the past.  . Glaucoma   . Dry eyes   . Blepharitis   . Macular degeneration     early , [facility]  . Goiter   . Hypothyroid   . Atrial fibrillation     persistent  . Symptomatic bradycardia     s/p PPM  . CHF (congestive heart failure)   . Pneumonia ~ 2003  . Exertional dyspnea     "have also been SOB at rest"  . Migraine headache     "used to have severe; not as much since hysterectomy"  . Lumbosacral disc disease   . 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 Arthritis     "knees"  . Anxiety   . Depression     Current Outpatient Prescriptions  Medication Sig Dispense Refill  . ALPRAZolam (XANAX) 0.5 MG tablet Take 0.5 mg by mouth 3 (three) times daily as needed. For anxiety      . Calcium Carbonate-Vitamin D (CALCIUM 600 + D PO) Take 1 tablet by mouth daily.       . cycloSPORINE (RESTASIS) 0.05 % ophthalmic emulsion Place 1 drop into both eyes 2 (two) times daily.       . dorzolamide-timolol (COSOPT) 22.3-6.8 MG/ML ophthalmic solution Place 1 drop into both eyes 2 (two) times daily.      . furosemide (LASIX) 40 MG  tablet Take 40 mg by mouth daily.      . hydrocortisone cream 1 % Apply 1 application topically 3 (three) times daily as needed (skin irritation).  30 g  0  . latanoprost (XALATAN) 0.005 % ophthalmic solution Place 1 drop into both eyes daily.       Marland Kitchen levothyroxine (SYNTHROID, LEVOTHROID) 88 MCG tablet Take 88 mcg by mouth daily.      Marland Kitchen MINIVELLE 0.075 MG/24HR Place 1 patch onto the skin 2 (two) times a week.       . Multiple Vitamins-Minerals (ICAPS) CAPS Take 1 capsule by mouth 2 (two) times daily.       . Omega-3 Fatty Acids (FISH OIL) 1200 MG CAPS Take 1,000 mg by mouth 4 (four) times daily.       Marland Kitchen PARoxetine (PAXIL-CR) 37.5 MG 24 hr tablet Take 37.5 mg by mouth as needed.        . potassium chloride SA (K-DUR,KLOR-CON) 20 MEQ tablet Take 20 mEq by mouth daily.      . rosuvastatin (CRESTOR) 40 MG tablet Take 40 mg by mouth daily.      . traMADol (ULTRAM) 50 MG tablet Take 50 mg by mouth every 6 (six) hours as needed. For pain      . warfarin (COUMADIN) 5 MG tablet Take 1/2 tablet (2.5 mg total) by mouth once daily  30 tablet  1  . ZIOPTAN 0.0015 % SOLN       . zolmitriptan (ZOMIG) 5 MG tablet Take 5 mg by mouth as needed. For migraines      . zolpidem (AMBIEN) 10 MG tablet Take 10 mg by mouth at bedtime as needed. For sleep       No current facility-administered medications for this visit.    Allergies:    Allergies  Allergen Reactions  . Trazodone And Nefazodone Other (See Comments)    Weakness.    Social History:  The patient  reports that she quit smoking about 27 years ago. Her smoking use included Cigarettes. She has a 24 pack-year smoking history. She has never used smokeless tobacco. She reports that she drinks about 4.0 ounces of alcohol per week. She reports that she does not use illicit drugs.   ROS:  Please see the history of present illness.   Appetite is stable. Weight is stable. No transient neurological complaints. Denies palpitations.   All other systems reviewed and negative.   OBJECTIVE: VS:  BP 122/76  Pulse 84  Ht 5\' 4"  (1.626 m)  Wt 155 lb (70.308 kg)  BMI 26.59 kg/m2 Well nourished, well developed, in no acute distress, appearing younger than stated a HEENT: normal Neck: JVD moderate elevation with the patient sitting at 90. Flutter waves noted. Carotid bruit absent  Cardiac:  normal S1, S2; RRR; no murmur Lungs:  clear to auscultation bilaterally, no wheezing, rhonchi or rales Abd: soft, nontender, no hepatomegaly Ext: Edema none. Pulses 2+ Skin: warm and dry Neuro:  CNs 2-12 intact, no focal abnormalities noted  EKG:  Atrial flutter with variable AV conduction and V. pacing at 84 beats per minute occasional inappropriate  atrial pacing       Signed, Darci Needle III, MD 05/17/2013 3:08 PM

## 2013-05-17 NOTE — Patient Instructions (Addendum)
Stop Amiodarone  Your physician recommends that you schedule a follow-up appointment in:3 weeks 06/07/13 @ 2pm  Call the office [phone] if your heart rate is consistently over 100 bpm

## 2013-06-07 ENCOUNTER — Encounter: Payer: Self-pay | Admitting: Interventional Cardiology

## 2013-06-07 ENCOUNTER — Ambulatory Visit (INDEPENDENT_AMBULATORY_CARE_PROVIDER_SITE_OTHER): Payer: Medicare Other | Admitting: Interventional Cardiology

## 2013-06-07 VITALS — BP 132/80 | HR 75 | Ht 64.0 in | Wt 157.8 lb

## 2013-06-07 DIAGNOSIS — Z9889 Other specified postprocedural states: Secondary | ICD-10-CM

## 2013-06-07 DIAGNOSIS — I5032 Chronic diastolic (congestive) heart failure: Secondary | ICD-10-CM

## 2013-06-07 DIAGNOSIS — R001 Bradycardia, unspecified: Secondary | ICD-10-CM

## 2013-06-07 DIAGNOSIS — I484 Atypical atrial flutter: Secondary | ICD-10-CM

## 2013-06-07 DIAGNOSIS — I4892 Unspecified atrial flutter: Secondary | ICD-10-CM

## 2013-06-07 DIAGNOSIS — I498 Other specified cardiac arrhythmias: Secondary | ICD-10-CM

## 2013-06-07 MED ORDER — METOPROLOL SUCCINATE ER 25 MG PO TB24: 25.0000 mg | ORAL_TABLET | Freq: Every day | ORAL | Status: DC

## 2013-06-07 NOTE — Progress Notes (Signed)
Patient ID: Jo Hughes, female   DOB: 07-21-1940, 72 y.o.   MRN: [id number]    [address] [place], [place]  [address] Phone: [phone] Fax:  [phone]  Date:  06/07/2013   ID:  Jo Hughes, DOB February 04, 1941, MRN [id number]  PCP:  Lillia Mountain, MD   ASSESSMENT:  1. Atrial flutter with moderate rate control off amiodarone 2. Status post mitral valve repair 3. History of diastolic heart failure, compensated  PLAN:  1. Start metoprolol succinate 25 mg daily 2. 24 hour Holter one week after starting therapy   SUBJECTIVE: Jo Hughes is a 72 y.o. female has felt some palpitations since discontinuing amiodarone 3 weeks ago. Her energy level is unchanged. She denies orthopnea PND.   Wt Readings from Last 3 Encounters:  06/07/13 157 lb 12.8 oz (71.578 kg)  05/17/13 155 lb (70.308 kg)  04/20/13 150 lb (68.04 kg)     Past Medical History  Diagnosis Date  . Hx of atrial flutter july 2009    atypical atrial flutter  . H/O mitral valve repair [facility] , [place].  . H/O tricuspid valve repair   . Hx of acquired endocarditis 1970; 1986    Prior to mitral valve repair   . Hyperlipidemia     cardiac cath clean coronary arties in the past.  . Glaucoma   . Dry eyes   . Blepharitis   . Macular degeneration     early , Hecker  . Goiter   . Hypothyroid   . Atrial fibrillation     persistent  . Symptomatic bradycardia     s/p PPM  . CHF (congestive heart failure)   . Pneumonia ~ 2003  . Exertional dyspnea     "have also been SOB at rest"  . Migraine headache     "used to have severe; not as much since hysterectomy"  . Lumbosacral disc disease   . 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 Arthritis     "knees"  . Anxiety   . Depression     Current Outpatient Prescriptions  Medication Sig Dispense Refill  . ALPRAZolam (XANAX) 0.5 MG tablet Take 0.5 mg by mouth 3 (three) times daily as needed.  For anxiety      . Calcium Carbonate-Vitamin D (CALCIUM 600 + D PO) Take 1 tablet by mouth daily.       . cycloSPORINE (RESTASIS) 0.05 % ophthalmic emulsion Place 1 drop into both eyes 2 (two) times daily.       . dorzolamide-timolol (COSOPT) 22.3-6.8 MG/ML ophthalmic solution Place 1 drop into both eyes 2 (two) times daily.      . furosemide (LASIX) 40 MG tablet Take 40 mg by mouth daily.      . hydrocortisone cream 1 % Apply 1 application topically 3 (three) times daily as needed (skin irritation).  30 g  0  . latanoprost (XALATAN) 0.005 % ophthalmic solution Place 1 drop into both eyes daily.       Marland Kitchen levothyroxine (SYNTHROID, LEVOTHROID) 88 MCG tablet Take 88 mcg by mouth daily.      . Multiple Vitamins-Minerals (ICAPS) CAPS Take 1 capsule by mouth 2 (two) times daily.       . Omega-3 Fatty Acids (FISH OIL) 1200 MG CAPS Take 1,000 mg by mouth 4 (four) times daily.       Marland Kitchen PARoxetine (PAXIL-CR) 37.5 MG 24 hr  tablet Take 37.5 mg by mouth as needed.       . potassium chloride SA (K-DUR,KLOR-CON) 20 MEQ tablet Take 20 mEq by mouth daily.      . rosuvastatin (CRESTOR) 40 MG tablet Take 40 mg by mouth daily.      . traMADol (ULTRAM) 50 MG tablet Take 50 mg by mouth every 6 (six) hours as needed. For pain      . warfarin (COUMADIN) 5 MG tablet Take 1/2 tablet (2.5 mg total) by mouth once daily  30 tablet  1  . ZIOPTAN 0.0015 % SOLN Place 1 drop into both eyes daily.       Marland Kitchen zolmitriptan (ZOMIG) 5 MG tablet Take 5 mg by mouth as needed. For migraines      . zolpidem (AMBIEN) 10 MG tablet Take 10 mg by mouth at bedtime as needed. For sleep       No current facility-administered medications for this visit.    Allergies:    Allergies  Allergen Reactions  . Trazodone And Nefazodone Other (See Comments)    Weakness.    Social History:  The patient  reports that she quit smoking about 27 years ago. Her smoking use included Cigarettes. She has a 24 pack-year smoking history. She has never used  smokeless tobacco. She reports that she drinks about 4.0 ounces of alcohol per week. She reports that she does not use illicit drugs.   ROS:  Please see the history of present illness.   No bleeding. Other than palpitations no difference in overall condition   All other systems reviewed and negative.   OBJECTIVE: VS:  BP 132/80  Pulse 75  Ht 5\' 4"  (1.626 m)  Wt 157 lb 12.8 oz (71.578 kg)  BMI 27.07 kg/m2 Well nourished, well developed, in no acute distress, appears younger than her stated age HEENT: normal Neck: JVD flat. Carotid bruit no bruits  Cardiac:  normal S1, S2; RRR; no murmur Lungs:  clear to auscultation bilaterally, no wheezing, rhonchi or rales Abd: soft, nontender, no hepatomegaly Ext: Edema no edema. Pulses 2+ Skin: warm and dry Neuro:  CNs 2-12 intact, no focal abnormalities noted  EKG:    Atrial flutter with ventricular response of 75 beats per minute. There is variable AV block. Rare pacing is noted.     Signed, Darci Needle III, MD 06/07/2013 2:30 PM

## 2013-06-07 NOTE — Patient Instructions (Addendum)
Start Metoprolol Succinate 25mg  daily  Your physician has recommended that you wear a holter monitor. Holter monitors are medical devices that record the heart's electrical activity. Doctors most often use these monitors to diagnose arrhythmias. Arrhythmias are problems with the speed or rhythm of the heartbeat. The monitor is a small, portable device. You can wear one while you do your normal daily activities. This is usually used to diagnose what is causing palpitations/syncope (passing out). (24 hour monitor to be worn 1 week after starting Metoprolol)  Your physician recommends that you schedule a follow-up appointment in: 3 months

## 2013-06-08 ENCOUNTER — Encounter: Payer: Self-pay | Admitting: Interventional Cardiology

## 2013-06-16 ENCOUNTER — Ambulatory Visit (INDEPENDENT_AMBULATORY_CARE_PROVIDER_SITE_OTHER): Payer: Medicare Other | Admitting: *Deleted

## 2013-06-16 ENCOUNTER — Ambulatory Visit (INDEPENDENT_AMBULATORY_CARE_PROVIDER_SITE_OTHER): Payer: Medicare Other | Admitting: Pharmacist

## 2013-06-16 DIAGNOSIS — I4892 Unspecified atrial flutter: Secondary | ICD-10-CM

## 2013-06-16 DIAGNOSIS — I484 Atypical atrial flutter: Secondary | ICD-10-CM

## 2013-06-16 DIAGNOSIS — I4891 Unspecified atrial fibrillation: Secondary | ICD-10-CM

## 2013-06-16 DIAGNOSIS — Z950 Presence of cardiac pacemaker: Secondary | ICD-10-CM

## 2013-06-16 LAB — POCT INR: INR: 3.1

## 2013-06-16 LAB — MDC_IDC_ENUM_SESS_TYPE_INCLINIC
Battery Remaining Longevity: 130 mo
Battery Voltage: 2.77 V
Brady Statistic AP VP Percent: 36 %
Brady Statistic AP VS Percent: 34 %
Brady Statistic AS VP Percent: 0 %
Date Time Interrogation Session: 20141204164911
Lead Channel Impedance Value: 541 Ohm
Lead Channel Pacing Threshold Amplitude: 1 V
Lead Channel Sensing Intrinsic Amplitude: 1 mV
Lead Channel Sensing Intrinsic Amplitude: 15.67 mV
Lead Channel Setting Pacing Amplitude: 2 V
Lead Channel Setting Pacing Amplitude: 2.5 V

## 2013-06-16 NOTE — Progress Notes (Signed)
[facility] pt of Dr. Katrinka Blazing. Device check in clinic, all functions normal, no changes made, full details in PaceArt.  ROV w/ Dr. Johney Frame 09/22/13

## 2013-06-28 ENCOUNTER — Encounter (INDEPENDENT_AMBULATORY_CARE_PROVIDER_SITE_OTHER): Payer: Medicare Other

## 2013-06-28 ENCOUNTER — Encounter: Payer: Self-pay | Admitting: *Deleted

## 2013-06-28 DIAGNOSIS — I4892 Unspecified atrial flutter: Secondary | ICD-10-CM

## 2013-06-28 NOTE — Progress Notes (Signed)
Patient ID: Jo Hughes, female   DOB: [birth year], 72 y.o.   MRN: [id number] E-Cardio 24 hour holter monitor applied to patient.

## 2013-07-11 ENCOUNTER — Ambulatory Visit (INDEPENDENT_AMBULATORY_CARE_PROVIDER_SITE_OTHER): Payer: Medicare Other | Admitting: Pharmacist

## 2013-07-11 DIAGNOSIS — I4891 Unspecified atrial fibrillation: Secondary | ICD-10-CM

## 2013-07-11 LAB — POCT INR: INR: 1

## 2013-07-21 ENCOUNTER — Ambulatory Visit (INDEPENDENT_AMBULATORY_CARE_PROVIDER_SITE_OTHER): Payer: Medicare Other | Admitting: *Deleted

## 2013-07-21 DIAGNOSIS — I4891 Unspecified atrial fibrillation: Secondary | ICD-10-CM

## 2013-07-21 LAB — POCT INR: INR: 2.1

## 2013-07-27 ENCOUNTER — Telehealth: Payer: Self-pay | Admitting: Interventional Cardiology

## 2013-07-27 NOTE — Telephone Encounter (Signed)
New Problem: ° ° °Pt is requesting a call back to hear her heart monitor results.  °

## 2013-08-01 ENCOUNTER — Ambulatory Visit (INDEPENDENT_AMBULATORY_CARE_PROVIDER_SITE_OTHER): Payer: Medicare Other | Admitting: Pharmacist

## 2013-08-01 DIAGNOSIS — I4891 Unspecified atrial fibrillation: Secondary | ICD-10-CM

## 2013-08-01 LAB — POCT INR: INR: 2.9

## 2013-08-03 ENCOUNTER — Telehealth: Payer: Self-pay

## 2013-08-03 NOTE — Telephone Encounter (Signed)
called to give results of 24hr holter. Aflutter with controlled rate.no changes made.pt home phone busy lmom on pt mobile to return call

## 2013-08-04 ENCOUNTER — Ambulatory Visit (INDEPENDENT_AMBULATORY_CARE_PROVIDER_SITE_OTHER): Payer: Medicare Other | Admitting: Interventional Cardiology

## 2013-08-04 ENCOUNTER — Encounter: Payer: Self-pay | Admitting: Interventional Cardiology

## 2013-08-04 VITALS — BP 136/88 | HR 84 | Ht 64.0 in | Wt 158.0 lb

## 2013-08-04 DIAGNOSIS — I5032 Chronic diastolic (congestive) heart failure: Secondary | ICD-10-CM

## 2013-08-04 DIAGNOSIS — Z9889 Other specified postprocedural states: Secondary | ICD-10-CM

## 2013-08-04 DIAGNOSIS — R001 Bradycardia, unspecified: Secondary | ICD-10-CM

## 2013-08-04 DIAGNOSIS — Z950 Presence of cardiac pacemaker: Secondary | ICD-10-CM

## 2013-08-04 DIAGNOSIS — I4891 Unspecified atrial fibrillation: Secondary | ICD-10-CM

## 2013-08-04 DIAGNOSIS — I498 Other specified cardiac arrhythmias: Secondary | ICD-10-CM

## 2013-08-04 DIAGNOSIS — I4892 Unspecified atrial flutter: Secondary | ICD-10-CM

## 2013-08-04 MED ORDER — FUROSEMIDE 40 MG PO TABS: 60.0000 mg | ORAL_TABLET | Freq: Every day | ORAL | Status: DC

## 2013-08-04 MED ORDER — METOPROLOL SUCCINATE ER 25 MG PO TB24: 50.0000 mg | ORAL_TABLET | Freq: Every day | ORAL | Status: DC

## 2013-08-04 NOTE — Progress Notes (Signed)
Patient ID: Jo Hughes, female   DOB: 02/24/1941, 73 y.o.   MRN: [id number]    [address] [place], [place]  [address] Phone: [phone] Fax:  [phone]  Date:  08/04/2013   ID:  Jo Hughes, DOB 15-Sep-1940, MRN [id number]  PCP:  Irven Shelling, MD   ASSESSMENT:  1. Atrial flutter, failed amiodarone therapy. Multiple prior electrical cardioversions. We're now attempting rate control. Recent Holter monitor suggests rate control with an average heart rate of 72 beats per minute minimal heart rate of 58 beats per minute maximum 115 beats per minute 2. History of myxomatous mitral valve disease, status post mitral valve repair at the [facility] approximately 10 years ago 3. Acute on chronic diastolic heart failure, aggravated by atrial flutter 4. Chronic anticoagulation therapy 5. Tachycardia Bradycardia syndrome, with history of recurrent atrial flutter, atrial fibrillation, and bradycardia requiring permanent pacemaker  PLAN:  1. Further increase metoprolol to 50 mg per day 2. Increase furosemide to 60 mg per day, to decrease symptoms of heart failure 3. Electrophysiology consultation with Dr. Rayann Heman for consideration of ablation as the patient has suffered significant reduction in exertional ability since being in atrial flutter 4. Followup with me in one month if there is no electrophysiology solution to the patient's current situation. Be met on return to   SUBJECTIVE: Jo Hughes is a 73 y.o. female who has history of mitral valve repair because of myxomatous mitral valve regurgitation. She has had difficulty with atrial arrhythmias since surgery. She has had both atrial fibrillation and atrial flutter. She has had multiple cardioversions over the years. He has been most recently on amiodarone therapy. The last cardioversion occurred within the past 2 months and was effective with maintaining sinus rhythm for less than 72 hours. We made a decision to  discontinue amiodarone and attempt rate control. Since embarking upon this course, she has become progressively more dyspneic and has decreased exertional tolerance. She denies orthopnea. There is mild lower extremity edema to   Wt Readings from Last 3 Encounters:  08/04/13 158 lb (71.668 kg)  06/07/13 157 lb 12.8 oz (71.578 kg)  05/17/13 155 lb (70.308 kg)     Past Medical History  Diagnosis Date  . Hx of atrial flutter july 2009    atypical atrial flutter  . H/O mitral valve repair [facility] , [place].  . H/O tricuspid valve repair   . Hx of acquired endocarditis 1970; 1986    Prior to mitral valve repair   . Hyperlipidemia     cardiac cath clean coronary arties in the past.  . Glaucoma   . Dry eyes   . Blepharitis   . Macular degeneration     early , Hecker  . Goiter   . Hypothyroid   . Atrial fibrillation     persistent  . Symptomatic bradycardia     s/p PPM  . CHF (congestive heart failure)   . Pneumonia ~ 2003  . Exertional dyspnea     "have also been SOB at rest"  . Migraine headache     "used to have severe; not as much since hysterectomy"  . Lumbosacral disc disease   . 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 Arthritis     "knees"  . Anxiety   . Depression     Current Outpatient Prescriptions  Medication Sig Dispense Refill  . ALPRAZolam (XANAX) 0.5  MG tablet Take 0.5 mg by mouth 3 (three) times daily as needed. For anxiety      . Calcium Carbonate-Vitamin D (CALCIUM 600 + D PO) Take 1 tablet by mouth daily.       . cycloSPORINE (RESTASIS) 0.05 % ophthalmic emulsion Place 1 drop into both eyes 2 (two) times daily.       . dorzolamide-timolol (COSOPT) 22.3-6.8 MG/ML ophthalmic solution Place 1 drop into both eyes 2 (two) times daily.      . furosemide (LASIX) 40 MG tablet Take 40 mg by mouth daily.      . hydrocortisone cream 1 % Apply 1 application topically 3 (three) times daily as needed (skin irritation).   30 g  0  . latanoprost (XALATAN) 0.005 % ophthalmic solution Place 1 drop into both eyes daily.       Marland Kitchen levothyroxine (SYNTHROID, LEVOTHROID) 88 MCG tablet Take 88 mcg by mouth daily.      . metoprolol succinate (TOPROL XL) 25 MG 24 hr tablet Take 1 tablet (25 mg total) by mouth daily.  30 tablet  11  . Multiple Vitamins-Minerals (ICAPS) CAPS Take 1 capsule by mouth 2 (two) times daily.       . Omega-3 Fatty Acids (FISH OIL) 1200 MG CAPS Take 1,000 mg by mouth 4 (four) times daily.       Marland Kitchen PARoxetine (PAXIL-CR) 37.5 MG 24 hr tablet Take 37.5 mg by mouth as needed.       . potassium chloride SA (K-DUR,KLOR-CON) 20 MEQ tablet Take 20 mEq by mouth daily.      . rosuvastatin (CRESTOR) 40 MG tablet Take 40 mg by mouth daily.      . traMADol (ULTRAM) 50 MG tablet Take 50 mg by mouth every 6 (six) hours as needed. For pain      . warfarin (COUMADIN) 5 MG tablet Take 1/2 tablet (2.5 mg total) by mouth once daily  30 tablet  1  . ZIOPTAN 0.0015 % SOLN Place 1 drop into both eyes daily.       Marland Kitchen zolmitriptan (ZOMIG) 5 MG tablet Take 5 mg by mouth as needed. For migraines      . zolpidem (AMBIEN) 10 MG tablet Take 10 mg by mouth at bedtime as needed. For sleep       No current facility-administered medications for this visit.    Allergies:    Allergies  Allergen Reactions  . Trazodone And Nefazodone Other (See Comments)    Weakness.    Social History:  The patient  reports that she quit smoking about 28 years ago. Her smoking use included Cigarettes. She has a 24 pack-year smoking history. She has never used smokeless tobacco. She reports that she drinks about 4.0 ounces of alcohol per week. She reports that she does not use illicit drugs.   ROS:  Please see the history of present illness.   Denies bleeding complications. No transient neurological symptoms. She denies orthopnea. No anginal quality chest pain. She denies palpitations.   All other systems reviewed and negative.   OBJECTIVE: VS:  BP  136/88  Pulse 84  Ht 5' 4"  (1.626 m)  Wt 158 lb (71.668 kg)  BMI 27.11 kg/m2 Well nourished, well developed, in no acute distress, appears compensated HEENT: normal Neck: JVD moderate elevation sitting at 90. Carotid bruit absent  Cardiac:  normal S1, S2; RRR at a rate of 70; no murmur Lungs:  clear to auscultation bilaterally, no wheezing, rhonchi or rales Abd: soft,  nontender, no hepatomegaly Ext: Edema trace bilateral. Pulses 2+ and symmetric Skin: warm and dry Neuro:  CNs 2-12 intact, no focal abnormalities noted  EKG:  Not performed       Signed, Illene Labrador III, MD 08/04/2013 9:38 AM

## 2013-08-04 NOTE — Patient Instructions (Signed)
Your physician has recommended you make the following change in your medication:   1. Increase Lasix to 60 mg (1 1/2 tablet) once daily 2. Increase Metoprolol to 50 mg (2 tablets) daily  Your physician recommends that you return for lab work in: 1 month,  BMET  You have been referred to Dr. Rayann Heman for EP eval.  Your physician recommends that you schedule a follow-up appointment in: 1 month with Dr. Tamala Julian

## 2013-08-15 ENCOUNTER — Other Ambulatory Visit: Payer: Self-pay | Admitting: *Deleted

## 2013-08-15 MED ORDER — METOPROLOL SUCCINATE ER 25 MG PO TB24: 50.0000 mg | ORAL_TABLET | Freq: Every day | ORAL | Status: DC

## 2013-08-15 MED ORDER — FUROSEMIDE 40 MG PO TABS: 60.0000 mg | ORAL_TABLET | Freq: Every day | ORAL | Status: DC

## 2013-08-18 ENCOUNTER — Ambulatory Visit (INDEPENDENT_AMBULATORY_CARE_PROVIDER_SITE_OTHER): Payer: Medicare Other | Admitting: Internal Medicine

## 2013-08-18 ENCOUNTER — Encounter: Payer: Self-pay | Admitting: Internal Medicine

## 2013-08-18 VITALS — BP 118/76 | HR 85 | Ht 64.0 in | Wt 154.0 lb

## 2013-08-18 DIAGNOSIS — I4891 Unspecified atrial fibrillation: Secondary | ICD-10-CM

## 2013-08-18 LAB — MDC_IDC_ENUM_SESS_TYPE_INCLINIC
Battery Remaining Longevity: 120 mo
Brady Statistic AP VP Percent: 80 %
Brady Statistic AP VS Percent: 4 %
Brady Statistic AS VS Percent: 16 %
Date Time Interrogation Session: 20150205090458
Lead Channel Impedance Value: 556 Ohm
Lead Channel Impedance Value: 726 Ohm
Lead Channel Pacing Threshold Amplitude: 0.5 V
Lead Channel Pacing Threshold Pulse Width: 0.76 ms
Lead Channel Sensing Intrinsic Amplitude: 15.67 mV
Lead Channel Setting Pacing Amplitude: 2.5 V
Lead Channel Setting Sensing Sensitivity: 5.6 mV
MDC IDC MSMT BATTERY IMPEDANCE: 160 Ohm
MDC IDC MSMT BATTERY VOLTAGE: 2.78 V
MDC IDC MSMT LEADCHNL RA SENSING INTR AMPL: 1.4 mV
MDC IDC SET LEADCHNL RA PACING AMPLITUDE: 2 V
MDC IDC SET LEADCHNL RV PACING PULSEWIDTH: 0.76 ms
MDC IDC STAT BRADY AS VP PERCENT: 0 %

## 2013-08-18 NOTE — Progress Notes (Signed)
PCP:  Irven Shelling, MD Primary Cardiologist:  Dr Tamala Julian  The patient presents today for routine electrophysiology followup.  Since last being seen in our clinic, the patient reports doing very well.  Today, she denies symptoms of palpitations, chest pain, shortness of breath, orthopnea, PND, lower extremity edema, dizziness, presyncope, syncope, or neurologic sequela.  The patient feels that she is tolerating medications without difficulties and is otherwise without complaint today.   Past Medical History  Diagnosis Date  . Hx of atrial flutter july 2009    atypical atrial flutter  . H/O mitral valve repair [facility] , [place].  . H/O tricuspid valve repair   . Hx of acquired endocarditis 1970; 1986    Prior to mitral valve repair   . Hyperlipidemia     cardiac cath clean coronary arties in the past.  . Glaucoma   . Dry eyes   . Blepharitis   . Macular degeneration     early , Hecker  . Goiter   . Hypothyroid   . Atrial fibrillation     persistent  . Symptomatic bradycardia     s/p PPM  . Chronic diastolic CHF (congestive heart failure)   . Pneumonia ~ 2003  . Migraine headache     "used to have severe; not as much since hysterectomy"  . Lumbosacral disc disease   . 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 Arthritis     "knees"  . Anxiety   . Depression   . Depressive disorder, not elsewhere classified   . Thyroid nodule   . Long term (current) use of anticoagulants     No bleeding  . 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 Tachy-brady syndrome   . Cardiac pacemaker in situ 2013   Past Surgical History  Procedure Laterality Date  . Mitral valve repair  05/2004    [facility]  . Cardioversion  ~ 2011; 09/18/2011    ?; Procedure: CARDIOVERSION;  Surgeon: Sinclair Grooms, MD;  Location: [place];  Service: Cardiovascular;  Laterality: N/A;  . Pacemaker insertion  01/13/12    MDT Adapta L implanted by Dr Rayann Heman  .  Tonsillectomy and adenoidectomy  1948  . Abdominal hysterectomy  1986  . Knee cartilage surgery  02/2009    left; Dr. Berenice Primas  . Lumbar laminectomy  1997  . Tricuspid valve surgery  05/2004    "repair; [facility]"  . Back surgery    . Cataract extraction w/ intraocular lens  implant, bilateral  ~ 2011  . Cardiac catheterization      "probably 3 so far" (01/13/12)  . Cardioversion N/A 04/29/2013    Procedure: CARDIOVERSION;  Surgeon: Sinclair Grooms, MD;  Location: [facility];  Service: Cardiovascular;  Laterality: N/A;    Current Outpatient Prescriptions  Medication Sig Dispense Refill  . ALPRAZolam (XANAX) 0.5 MG tablet Take 0.5 mg by mouth 3 (three) times daily as needed. For anxiety      . Calcium Carbonate-Vitamin D (CALCIUM 600 + D PO) Take 1 tablet by mouth daily.       . cycloSPORINE (RESTASIS) 0.05 % ophthalmic emulsion Place 1 drop into both eyes 2 (two) times daily.       . dorzolamide-timolol (COSOPT) 22.3-6.8 MG/ML ophthalmic solution Place 1 drop into both eyes 2 (two) times daily.      . furosemide (LASIX) 40 MG tablet Take 1.5 tablets (  60 mg total) by mouth daily.  135 tablet  0  . hydrocortisone cream 1 % Apply 1 application topically 3 (three) times daily as needed (skin irritation).  30 g  0  . latanoprost (XALATAN) 0.005 % ophthalmic solution Place 1 drop into both eyes daily.       Marland Kitchen levothyroxine (SYNTHROID, LEVOTHROID) 88 MCG tablet Take 88 mcg by mouth daily.      . metoprolol succinate (TOPROL XL) 25 MG 24 hr tablet Take 2 tablets (50 mg total) by mouth daily.  180 tablet  0  . Multiple Vitamins-Minerals (ICAPS) CAPS Take 1 capsule by mouth 2 (two) times daily.       . Omega-3 Fatty Acids (FISH OIL) 1200 MG CAPS Take 1,000 mg by mouth 4 (four) times daily.       Marland Kitchen PARoxetine (PAXIL-CR) 37.5 MG 24 hr tablet Take 37.5 mg by mouth as needed.       . potassium chloride SA (K-DUR,KLOR-CON) 20 MEQ tablet Take 20 mEq by mouth daily.      . rosuvastatin (CRESTOR)  40 MG tablet Take 40 mg by mouth daily.      . traMADol (ULTRAM) 50 MG tablet Take 50 mg by mouth every 6 (six) hours as needed. For pain      . warfarin (COUMADIN) 5 MG tablet Take 1/2 tablet (2.5 mg total) by mouth once daily  30 tablet  1  . ZIOPTAN 0.0015 % SOLN Place 1 drop into both eyes daily.       Marland Kitchen zolmitriptan (ZOMIG) 5 MG tablet Take 5 mg by mouth as needed. For migraines      . zolpidem (AMBIEN) 10 MG tablet Take 10 mg by mouth at bedtime as needed. For sleep       No current facility-administered medications for this visit.    Allergies  Allergen Reactions  . Trazodone And Nefazodone Other (See Comments)    Weakness.  . Citalopram Hydrobromide Other (See Comments)    Headache and insomnia    History   Social History  . Marital Status: Married    Spouse Name: N/A    Number of Children: N/A  . Years of Education: N/A   Occupational History  . Not on file.   Social History Main Topics  . Smoking status: Former Smoker -- 1.00 packs/day for 24 years    Types: Cigarettes    Quit date: 07/14/1985  . Smokeless tobacco: Never Used  . Alcohol Use: 4.0 oz/week    8 drink(s) per week     Comment: wine 4 glasses/wk  . Drug Use: No  . Sexual Activity: Yes   Other Topics Concern  . Not on file   Social History Narrative   Lives in [place] with spouse.  No children.  Retired Education officer, museum.    Family History  Problem Relation Age of Onset  . Stroke Father   . Heart attack Father 37  . Macular degeneration Mother   . Transient ischemic attack Mother     multiple  . Hypertension Brother     ROS-  All systems are reviewed and are negative except as outlined in the HPI above  Physical Exam: Filed Vitals:   08/18/13 0837  BP: 118/76  Pulse: 85  Height: 5\' 4"  (1.626 m)  Weight: 154 lb (69.854 kg)    GEN- The patient is well appearing, alert and oriented x 3 today.   Head- normocephalic, atraumatic Eyes-  Sclera clear, conjunctiva pink Ears- hearing  intact Oropharynx- clear Neck- supple, no JVP Lymph- no cervical lymphadenopathy Lungs- Clear to ausculation bilaterally, normal work of breathing Heart- irregular rate and rhythm, no murmurs, rubs or gallops, PMI not laterally displaced GI- soft, NT, ND, + BS Extremities- no clubbing, cyanosis, or edema MS- no significant deformity or atrophy Skin- no rash or lesion Psych- euthymic mood, full affect Neuro- strength and sensation are   ekgs in epic are reviewed and reveal atypical atrial flutter Recent holter and echo are reviewed Dr Thompson Caul notes are reviewed  Assessment and Plan:  1. pesistent afib/ atypical atrial flutter The patient has persistent afib and atypical atrial flutter.  She has failed medical therapy with amiodarone. She is doing reasonably well with rate control but does have fatigue at times.  She is appropriately anticoagulated.  Therapeutic strategies for afib and atypical atrial flutter including medicine and ablation were discussed in detail with the patient today. Risk, benefits, and alternatives to EP study and radiofrequency ablation for afib were also discussed in detail today.  She is aware that anticipated success rates for ablation are probably 50-60% with a high likelihood of requiring multiple procedures.  Her valvular heart disease and enlarged LA suggest reduced success rates.  These risks include but are not limited to stroke, bleeding, vascular damage, tamponade, perforation, damage to the esophagus, lungs, and other structures, pulmonary vein stenosis, worsening renal function, pacemaker lead dislodgement and death. The patient understands these risk and wishes to contemplate this further.  She will contact my office if she decides to proceed. No changes are made today  2. Tachycardia/ bradycardia syndrome Normal pacemaker function See Pace Art report No changes today  3. Valvular heart disease Echo reviewed Stable No changes  today  [facility] Return in 1 year  She will call if she decides to proceed with ablation in the interim.

## 2013-08-18 NOTE — Patient Instructions (Signed)
Your physician wants you to follow-up in: 12 months with Dr Vallery Ridge will receive a reminder letter in the mail two months in advance. If you don't receive a letter, please call our office to schedule the follow-up appointment.    Remote monitoring is used to monitor your Pacemaker of ICD from home. This monitoring reduces the number of office visits required to check your device to one time per year. It allows [place] to keep an eye on the functioning of your device to ensure it is working properly. You are scheduled for a device check from home on 11/21/13. You may send your transmission at any time that day. If you have a wireless device, the transmission will be sent automatically. After your physician reviews your transmission, you will receive a postcard with your next transmission date.  Your physician has recommended that you have an ablation. Catheter ablation is a medical procedure used to treat some cardiac arrhythmias (irregular heartbeats). During catheter ablation, a long, thin, flexible tube is put into a blood vessel in your groin (upper thigh), or neck. This tube is called an ablation catheter. It is then guided to your heart through the blood vessel. Radio frequency waves destroy small areas of heart tissue where abnormal heartbeats may cause an arrhythmia to start. Please see the instruction sheet given to you today.  Call [place]onia Reader if you decide to proceed [phone]

## 2013-08-22 ENCOUNTER — Ambulatory Visit (INDEPENDENT_AMBULATORY_CARE_PROVIDER_SITE_OTHER): Payer: Medicare Other | Admitting: Pharmacist

## 2013-08-22 DIAGNOSIS — I4891 Unspecified atrial fibrillation: Secondary | ICD-10-CM

## 2013-08-22 LAB — POCT INR: INR: 3.1

## 2013-08-25 ENCOUNTER — Encounter: Payer: Self-pay | Admitting: Internal Medicine

## 2013-08-29 ENCOUNTER — Ambulatory Visit: Payer: Medicare Other | Admitting: Interventional Cardiology

## 2013-08-31 ENCOUNTER — Encounter (HOSPITAL_COMMUNITY): Payer: Self-pay | Admitting: Emergency Medicine

## 2013-08-31 ENCOUNTER — Emergency Department (HOSPITAL_COMMUNITY): Payer: Medicare Other

## 2013-08-31 ENCOUNTER — Emergency Department (HOSPITAL_COMMUNITY)
Admission: EM | Admit: 2013-08-31 | Discharge: 2013-09-01 | Disposition: A | Discharge status: Home / Self Care | Payer: Medicare Other | Attending: Emergency Medicine | Admitting: Emergency Medicine

## 2013-08-31 DIAGNOSIS — Z8701 Personal history of pneumonia (recurrent): Secondary | ICD-10-CM | POA: Insufficient documentation

## 2013-08-31 DIAGNOSIS — Z7901 Long term (current) use of anticoagulants: Secondary | ICD-10-CM | POA: Insufficient documentation

## 2013-08-31 DIAGNOSIS — F3289 Other specified depressive episodes: Secondary | ICD-10-CM | POA: Insufficient documentation

## 2013-08-31 DIAGNOSIS — Z954 Presence of other heart-valve replacement: Secondary | ICD-10-CM | POA: Insufficient documentation

## 2013-08-31 DIAGNOSIS — Z87891 Personal history of nicotine dependence: Secondary | ICD-10-CM | POA: Insufficient documentation

## 2013-08-31 DIAGNOSIS — Z79899 Other long term (current) drug therapy: Secondary | ICD-10-CM | POA: Insufficient documentation

## 2013-08-31 DIAGNOSIS — E785 Hyperlipidemia, unspecified: Secondary | ICD-10-CM | POA: Insufficient documentation

## 2013-08-31 DIAGNOSIS — I4891 Unspecified atrial fibrillation: Secondary | ICD-10-CM | POA: Insufficient documentation

## 2013-08-31 DIAGNOSIS — Z9889 Other specified postprocedural states: Secondary | ICD-10-CM | POA: Insufficient documentation

## 2013-08-31 DIAGNOSIS — I4892 Unspecified atrial flutter: Secondary | ICD-10-CM | POA: Insufficient documentation

## 2013-08-31 DIAGNOSIS — F329 Major depressive disorder, single episode, unspecified: Secondary | ICD-10-CM | POA: Insufficient documentation

## 2013-08-31 DIAGNOSIS — E039 Hypothyroidism, unspecified: Secondary | ICD-10-CM | POA: Insufficient documentation

## 2013-08-31 DIAGNOSIS — IMO0002 Reserved for concepts with insufficient information to code with codable children: Secondary | ICD-10-CM | POA: Insufficient documentation

## 2013-08-31 DIAGNOSIS — H409 Unspecified glaucoma: Secondary | ICD-10-CM | POA: Insufficient documentation

## 2013-08-31 DIAGNOSIS — F411 Generalized anxiety disorder: Secondary | ICD-10-CM | POA: Insufficient documentation

## 2013-08-31 DIAGNOSIS — I5032 Chronic diastolic (congestive) heart failure: Secondary | ICD-10-CM | POA: Insufficient documentation

## 2013-08-31 DIAGNOSIS — M1710 Unilateral primary osteoarthritis, unspecified knee: Secondary | ICD-10-CM | POA: Insufficient documentation

## 2013-08-31 DIAGNOSIS — N200 Calculus of kidney: Secondary | ICD-10-CM

## 2013-08-31 DIAGNOSIS — Z950 Presence of cardiac pacemaker: Secondary | ICD-10-CM | POA: Insufficient documentation

## 2013-08-31 DIAGNOSIS — G43909 Migraine, unspecified, not intractable, without status migrainosus: Secondary | ICD-10-CM | POA: Insufficient documentation

## 2013-08-31 LAB — COMPREHENSIVE METABOLIC PANEL
ALBUMIN: 4 g/dL (ref 3.5–5.2)
ALK PHOS: 84 U/L (ref 39–117)
ALT: 39 U/L — ABNORMAL HIGH (ref 0–35)
AST: 31 U/L (ref 0–37)
BILIRUBIN TOTAL: 0.4 mg/dL (ref 0.3–1.2)
BUN: 26 mg/dL — AB (ref 6–23)
CHLORIDE: 104 meq/L (ref 96–112)
CO2: 23 mEq/L (ref 19–32)
CREATININE: 0.99 mg/dL (ref 0.50–1.10)
Calcium: 9.9 mg/dL (ref 8.4–10.5)
GFR, EST AFRICAN AMERICAN: 65 mL/min — AB (ref >90)
GFR, EST NON AFRICAN AMERICAN: 56 mL/min — AB (ref >90)
GLUCOSE: 150 mg/dL — AB (ref 70–99)
Potassium: 4.3 mEq/L (ref 3.7–5.3)
Sodium: 141 mEq/L (ref 137–147)
Total Protein: 7.3 g/dL (ref 6.0–8.3)

## 2013-08-31 LAB — CBC WITH DIFFERENTIAL/PLATELET
BASOS PCT: 0 % (ref 0–1)
Basophils Absolute: 0 10*3/uL (ref 0.0–0.1)
Eosinophils Absolute: 0.1 10*3/uL (ref 0.0–0.7)
Eosinophils Relative: 1 % (ref 0–5)
HEMATOCRIT: 43.6 % (ref 36.0–46.0)
HEMOGLOBIN: 15.1 g/dL — AB (ref 12.0–15.0)
LYMPHS ABS: 1.4 10*3/uL (ref 0.7–4.0)
Lymphocytes Relative: 10 % — ABNORMAL LOW (ref 12–46)
MCH: 30.4 pg (ref 26.0–34.0)
MCHC: 34.6 g/dL (ref 30.0–36.0)
MCV: 87.7 fL (ref 78.0–100.0)
MONO ABS: 0.7 10*3/uL (ref 0.1–1.0)
Monocytes Relative: 5 % (ref 3–12)
Neutro Abs: 11.6 10*3/uL — ABNORMAL HIGH (ref 1.7–7.7)
Neutrophils Relative %: 84 % — ABNORMAL HIGH (ref 43–77)
Platelets: 172 10*3/uL (ref 150–400)
RBC: 4.97 MIL/uL (ref 3.87–5.11)
RDW: 13.7 % (ref 11.5–15.5)
WBC: 13.9 10*3/uL — ABNORMAL HIGH (ref 4.0–10.5)

## 2013-08-31 LAB — LIPASE, BLOOD: LIPASE: 55 U/L (ref 11–59)

## 2013-08-31 LAB — CG4 I-STAT (LACTIC ACID): Lactic Acid, Venous: 0.93 mmol/L (ref 0.5–2.2)

## 2013-08-31 LAB — PROTIME-INR
INR: 2.04 — ABNORMAL HIGH (ref 0.00–1.49)
Prothrombin Time: 22.4 seconds — ABNORMAL HIGH (ref 11.6–15.2)

## 2013-08-31 MED ORDER — SODIUM CHLORIDE 0.9 % IV BOLUS (SEPSIS)
500.0000 mL | Freq: Once | INTRAVENOUS | Status: AC
  Administered 2013-08-31: 500 mL via INTRAVENOUS

## 2013-08-31 MED ORDER — ONDANSETRON HCL 4 MG/2ML IJ SOLN
4.0000 mg | Freq: Once | INTRAMUSCULAR | Status: AC
  Administered 2013-08-31: 4 mg via INTRAVENOUS
  Filled 2013-08-31: qty 2

## 2013-08-31 MED ORDER — IOHEXOL 300 MG/ML  SOLN
25.0000 mL | Freq: Once | INTRAMUSCULAR | Status: AC | PRN
  Administered 2013-08-31: 25 mL via ORAL

## 2013-08-31 MED ORDER — ONDANSETRON HCL 4 MG/2ML IJ SOLN: 4.0000 mg | Freq: Once | INTRAMUSCULAR | Status: DC

## 2013-08-31 MED ORDER — HYDROMORPHONE HCL PF 1 MG/ML IJ SOLN
0.5000 mg | Freq: Once | INTRAMUSCULAR | Status: AC
  Administered 2013-08-31: 0.5 mg via INTRAVENOUS
  Filled 2013-08-31: qty 1

## 2013-08-31 MED ORDER — FENTANYL CITRATE 0.05 MG/ML IJ SOLN: 100.0000 ug | Freq: Once | INTRAMUSCULAR | Status: DC

## 2013-08-31 MED ORDER — FENTANYL CITRATE 0.05 MG/ML IJ SOLN
50.0000 ug | Freq: Once | INTRAMUSCULAR | Status: AC
  Administered 2013-08-31: 50 ug via INTRAVENOUS
  Filled 2013-08-31: qty 2

## 2013-08-31 NOTE — ED Provider Notes (Signed)
CSN: [id number]     Arrival date & time 08/31/13  2107 History   First MD Initiated Contact with Patient 08/31/13 2124     Chief Complaint  Patient presents with  . Abdominal Pain   HPI Comments: 73 yo F hx of atrial flutter, tachy-brady syndrome s/p pacemaker, h/o mitral valve repair, h/o triscupid valve repair, presents with CC of abdominal pain.  Pt states symptoms began tonight around 6 PM.  She states pain is a constant ache, starting periumbilically, but now pain is diffuse, and has a general feeling of bloating, distention.  She has had associated intractable nausea, nonbilious vomiting.  She has had loose bowel movement today, nonbloody.  Denies fever, chills, CP, SOB, diarrhea, myalgias, dysuria, vaginal discharge, vaginal bleeding, rash, or any other symptoms.  Denies previous occurrence.  Pt has had prior abdominal hysterectomy, but no other abdominal surgeries.  No meds taken for pain.  EMS called and brought pt in for further evaluation. Pt does have hx of atrial flutter/fib and is on chronic anticoagulation with Coumadin.  The history is provided by the patient. No language interpreter was used.    Past Medical History  Diagnosis Date  . Hx of atrial flutter july 2009    atypical atrial flutter  . H/O mitral valve repair [facility] [place].  . H/O tricuspid valve repair   . Hx of acquired endocarditis 1970; 1986    Prior to mitral valve repair   . Hyperlipidemia     cardiac cath clean coronary arties in the past.  . Glaucoma   . Dry eyes   . Blepharitis   . Macular degeneration     early , [place]  . Goiter   . Hypothyroid   . Atrial fibrillation     persistent  . Symptomatic bradycardia     s/p PPM  . Chronic diastolic CHF (congestive heart failure)   . Pneumonia ~ 2003  . Migraine headache     "used to have severe; not as much since hysterectomy"  . Lumbosacral disc disease   . 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  Arthritis     "knees"  . Anxiety   . Depression   . Depressive disorder, not elsewhere classified   . Thyroid nodule   . Long term (current) use of anticoagulants     No bleeding  . 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 Tachy-brady syndrome   . Cardiac pacemaker in situ 2013   Past Surgical History  Procedure Laterality Date  . Mitral valve repair  05/2004    [facility]  . Cardioversion  ~ 2011; 09/18/2011    ?; Procedure: CARDIOVERSION;  Surgeon: Sinclair Grooms, MD;  Location: [place];  Service: Cardiovascular;  Laterality: N/A;  . Pacemaker insertion  01/13/12    MDT Adapta L implanted by Dr Rayann Heman  . Tonsillectomy and adenoidectomy  1948  . Abdominal hysterectomy  1986  . Knee cartilage surgery  02/2009    left; Dr. Berenice Primas  . Lumbar laminectomy  1997  . Tricuspid valve surgery  05/2004    "repair; [facility]"  . Back surgery    . Cataract extraction w/ intraocular lens  implant, bilateral  ~ 2011  . Cardiac catheterization      "probably 3 so far" (01/13/12)  . Cardioversion N/A 04/29/2013    Procedure: CARDIOVERSION;  Surgeon: Sinclair Grooms, MD;  Location: [facility];  Service: Cardiovascular;  Laterality: N/A;   Family History  Problem Relation Age of Onset  . Stroke Father   . Heart attack Father 36  . Macular degeneration Mother   . Transient ischemic attack Mother     multiple  . Hypertension Brother    History  Substance Use Topics  . Smoking status: Former Smoker -- 1.00 packs/day for 24 years    Types: Cigarettes    Quit date: 07/14/1985  . Smokeless tobacco: Never Used  . Alcohol Use: 4.0 oz/week    8 drink(s) per week     Comment: wine 4 glasses/wk   OB History   Grav Para Term Preterm Abortions TAB SAB Ect Mult Living                 Review of Systems  Constitutional: Positive for chills. Negative for fever.  Respiratory: Negative for cough and shortness of breath.   Cardiovascular: Negative for chest pain.  Gastrointestinal:  Positive for nausea, vomiting and abdominal pain. Negative for diarrhea and constipation.  Genitourinary: Negative for dysuria, vaginal bleeding and vaginal discharge.  Musculoskeletal: Negative for myalgias.  Skin: Negative for rash.  Neurological: Negative for dizziness, weakness, light-headedness, numbness and headaches.  Hematological: Negative for adenopathy. Does not bruise/bleed easily.  All other systems reviewed and are negative.      Allergies  Trazodone and nefazodone and Citalopram hydrobromide  Home Medications   Current Outpatient Rx  Name  Route  Sig  Dispense  Refill  . ALPRAZolam (XANAX) 0.5 MG tablet   Oral   Take 0.5 mg by mouth 3 (three) times daily as needed. For anxiety         . Calcium Carbonate-Vitamin D (CALCIUM 600 + D PO)   Oral   Take 1 tablet by mouth daily.          . cycloSPORINE (RESTASIS) 0.05 % ophthalmic emulsion   Both Eyes   Place 1 drop into both eyes 2 (two) times daily.          . dorzolamide-timolol (COSOPT) 22.3-6.8 MG/ML ophthalmic solution   Both Eyes   Place 1 drop into both eyes 2 (two) times daily.         . furosemide (LASIX) 40 MG tablet   Oral   Take 1.5 tablets (60 mg total) by mouth daily.   135 tablet   0   . hydrocortisone cream 1 %   Topical   Apply 1 application topically 3 (three) times daily as needed (skin irritation).   30 g   0   . latanoprost (XALATAN) 0.005 % ophthalmic solution   Both Eyes   Place 1 drop into both eyes daily.          Marland Kitchen levothyroxine (SYNTHROID, LEVOTHROID) 88 MCG tablet   Oral   Take 88 mcg by mouth daily.         . metoprolol succinate (TOPROL XL) 25 MG 24 hr tablet   Oral   Take 2 tablets (50 mg total) by mouth daily.   180 tablet   0   . Multiple Vitamins-Minerals (ICAPS) CAPS   Oral   Take 1 capsule by mouth 2 (two) times daily.          . Omega-3 Fatty Acids (FISH OIL) 1200 MG CAPS   Oral   Take 1,000 mg by mouth 4 (four) times daily.          Marland Kitchen  PARoxetine (PAXIL-CR) 37.5 MG  24 hr tablet   Oral   Take 37.5 mg by mouth daily.          . potassium chloride SA (K-DUR,KLOR-CON) 20 MEQ tablet   Oral   Take 20 mEq by mouth daily.         . rosuvastatin (CRESTOR) 40 MG tablet   Oral   Take 40 mg by mouth daily.         . traMADol (ULTRAM) 50 MG tablet   Oral   Take 50 mg by mouth every 6 (six) hours as needed. For pain         . warfarin (COUMADIN) 5 MG tablet      Take 1/2 tablet (2.5 mg total) by mouth once daily   30 tablet   1   . ZIOPTAN 0.0015 % SOLN   Both Eyes   Place 1 drop into both eyes daily.          Marland Kitchen zolmitriptan (ZOMIG) 5 MG tablet   Oral   Take 5 mg by mouth as needed. For migraines         . zolpidem (AMBIEN) 10 MG tablet   Oral   Take 10 mg by mouth at bedtime as needed. For sleep          BP 144/77  Pulse 70  Temp(Src) 97.7 F (36.5 C) (Oral)  Resp 18  SpO2 100% Physical Exam  Nursing note and vitals reviewed. Constitutional: She is oriented to person, place, and time. She appears well-developed and well-nourished.  HENT:  Head: Normocephalic and atraumatic.  Right Ear: External ear normal.  Left Ear: External ear normal.  Mouth/Throat: Oropharynx is clear and moist.  Eyes: Conjunctivae and EOM are normal. Pupils are equal, round, and reactive to light.  Neck: Normal range of motion. Neck supple.  Cardiovascular: Normal rate, regular rhythm, normal heart sounds and intact distal pulses.   2+ radial, femoral pulses bilaterally.  Pulmonary/Chest: Effort normal and breath sounds normal. No respiratory distress. She has no wheezes. She has no rales. She exhibits no tenderness.  Abdominal: Soft. Bowel sounds are normal. She exhibits distension. She exhibits no mass. There is tenderness. There is no rebound and no guarding.  Mild distension.  No tympani to percussion.  No rebound TTP.  No guarding.  Diffuse abdominal TTP.  No pulsatile mass.    Musculoskeletal: Normal range of  motion.  Neurological: She is alert and oriented to person, place, and time.  Skin: Skin is warm and dry.    ED Course  Procedures (including critical care time) Labs Review Labs Reviewed - No data to display Imaging Review No results found.  EKG Interpretation   None       MDM   Final diagnoses:  None   73 yo F hx of atrial flutter, tachy-brady syndrome s/p pacemaker, h/o mitral valve repair, h/o triscupid valve repair, presents with CC of abdominal pain.  Filed Vitals:   08/31/13 2200  BP: 156/69  Pulse: 76  Temp:   Resp: 17   Physical exam as above.  Pt with sudden onset abdominal pain, nausea, vomiting.  No constipation, no fever.  Abdomen is slightly distended, diffusely TTP, but not tympanic, no rebound, no guarding.  DDx mesenteric ischemia, appendicitis, partial bowel obstruction.  Pt with normotensive, with equal radial, femoral pulses, unlikely aortic origin of pain.  Will get CBC, CMP, Lipase, upright CXR to r/o free air, and will get CT abdomen pelvis w contrast to further evaluate.  CXR unremarkable, no free air.  Labs demonstrate mild leukocytosis 13.9, with left shift 11.6.  CMP shows mildly elevated BUn, with normal Cr.  LFTs WNL with exception of ALT which is only mildly elevated. Lipase WNL.  INR 2.04.    CT abdomen pelvis demonstrates R nephrolithiasis, 3 mm stone at R UVJ w/ mild hydronephrosis, perinephric stranding, and cholelithiasis.    UA, and urine gram stain pending.  Pt's pain and nausea well controlled s/p Dilaudid, Fentanyl, Zofran, NS bolus.  If urine negative for infection, will d/c home with Rx Zofran, Norco, in good condition with PCP follow-up.   Signout to Dr. Cheri Guppy.  Please see her documentation for final disposition on patient.  I have discussed pt's care plan with Dr. Christy Gentles.  Sinda Du, MD    Sinda Du, MD 09/01/13 [phone]

## 2013-08-31 NOTE — ED Provider Notes (Signed)
Patient seen/examined in the Emergency Department in conjunction with Resident Physician Provider Justin Mend Patient reports abrupt onset of abdominal pain Exam : awake/alert, maex4, distended abdomen with diffuse tenderness Plan: will need to obtain CT imaging of abd/pelvis    Sharyon Cable, MD 08/31/13 2319

## 2013-08-31 NOTE — ED Notes (Signed)
Pt from home. Sudden onset of severe abdominal pain starting around the umbilicus, radiating out throughout the abdomen and into her back. Pt reports with nausea, with 1 episode of emesis. EMS stated there is no pulsation in her abdomen. Pt received 4 mg Zofran en route.

## 2013-09-01 ENCOUNTER — Emergency Department (HOSPITAL_COMMUNITY): Payer: Medicare Other

## 2013-09-01 ENCOUNTER — Telehealth: Payer: Self-pay | Admitting: Pharmacist

## 2013-09-01 ENCOUNTER — Ambulatory Visit (INDEPENDENT_AMBULATORY_CARE_PROVIDER_SITE_OTHER): Payer: Medicare Other | Admitting: Pharmacist

## 2013-09-01 ENCOUNTER — Encounter (HOSPITAL_COMMUNITY): Payer: Self-pay | Admitting: Radiology

## 2013-09-01 DIAGNOSIS — I4891 Unspecified atrial fibrillation: Secondary | ICD-10-CM

## 2013-09-01 LAB — URINALYSIS, ROUTINE W REFLEX MICROSCOPIC
BILIRUBIN URINE: NEGATIVE
Glucose, UA: NEGATIVE mg/dL
KETONES UR: NEGATIVE mg/dL
Leukocytes, UA: NEGATIVE
Nitrite: NEGATIVE
Protein, ur: 30 mg/dL — AB
Specific Gravity, Urine: 1.005 (ref 1.005–1.030)
Urobilinogen, UA: 0.2 mg/dL (ref 0.0–1.0)
pH: 6 (ref 5.0–8.0)

## 2013-09-01 LAB — GRAM STAIN

## 2013-09-01 LAB — POCT INR: INR: 2.2

## 2013-09-01 LAB — TYPE AND SCREEN
ABO/RH(D): O POS
Antibody Screen: NEGATIVE

## 2013-09-01 LAB — URINE MICROSCOPIC-ADD ON

## 2013-09-01 LAB — ABO/RH: ABO/RH(D): O POS

## 2013-09-01 MED ORDER — TRAMADOL HCL 50 MG PO TABS: 50.0000 mg | ORAL_TABLET | Freq: Four times a day (QID) | ORAL | Status: DC | PRN

## 2013-09-01 MED ORDER — IOHEXOL 300 MG/ML  SOLN
100.0000 mL | Freq: Once | INTRAMUSCULAR | Status: AC | PRN
  Administered 2013-09-01: 100 mL via INTRAVENOUS

## 2013-09-01 MED ORDER — ONDANSETRON HCL 4 MG PO TABS: 4.0000 mg | ORAL_TABLET | Freq: Four times a day (QID) | ORAL | Status: DC

## 2013-09-01 NOTE — Discharge Instructions (Signed)
Kidney Stones  Kidney stones (urolithiasis) are deposits that form inside your kidneys. The intense pain is caused by the stone moving through the urinary tract. When the stone moves, the ureter goes into spasm around the stone. The stone is usually passed in the urine.   CAUSES   · A disorder that makes certain neck glands produce too much parathyroid hormone (primary hyperparathyroidism).  · A buildup of uric acid crystals, similar to gout in your joints.  · Narrowing (stricture) of the ureter.  · A kidney obstruction present at birth (congenital obstruction).  · Previous surgery on the kidney or ureters.  · Numerous kidney infections.  SYMPTOMS   · Feeling sick to your stomach (nauseous).  · Throwing up (vomiting).  · Blood in the urine (hematuria).  · Pain that usually spreads (radiates) to the groin.  · Frequency or urgency of urination.  DIAGNOSIS   · Taking a history and physical exam.  · Blood or urine tests.  · CT scan.  · Occasionally, an examination of the inside of the urinary bladder (cystoscopy) is performed.  TREATMENT   · Observation.  · Increasing your fluid intake.  · Extracorporeal shock wave lithotripsy This is a noninvasive procedure that uses shock waves to break up kidney stones.  · Surgery may be needed if you have severe pain or persistent obstruction. There are various surgical procedures. Most of the procedures are performed with the use of small instruments. Only small incisions are needed to accommodate these instruments, so recovery time is minimized.  The size, location, and chemical composition are all important variables that will determine the proper choice of action for you. Talk to your health care provider to better understand your situation so that you will minimize the risk of injury to yourself and your kidney.   HOME CARE INSTRUCTIONS   · Drink enough water and fluids to keep your urine clear or pale yellow. This will help you to pass the stone or stone fragments.  · Strain  all urine through the provided strainer. Keep all particulate matter and stones for your health care provider to see. The stone causing the pain may be as small as a grain of salt. It is very important to use the strainer each and every time you pass your urine. The collection of your stone will allow your health care provider to analyze it and verify that a stone has actually passed. The stone analysis will often identify what you can do to reduce the incidence of recurrences.  · Only take over-the-counter or prescription medicines for pain, discomfort, or fever as directed by your health care provider.  · Make a follow-up appointment with your health care provider as directed.  · Get follow-up X-rays if required. The absence of pain does not always mean that the stone has passed. It may have only stopped moving. If the urine remains completely obstructed, it can cause loss of kidney function or even complete destruction of the kidney. It is your responsibility to make sure X-rays and follow-ups are completed. Ultrasounds of the kidney can show blockages and the status of the kidney. Ultrasounds are not associated with any radiation and can be performed easily in a matter of minutes.  SEEK MEDICAL CARE IF:  · You experience pain that is progressive and unresponsive to any pain medicine you have been prescribed.  SEEK IMMEDIATE MEDICAL CARE IF:   · Pain cannot be controlled with the prescribed medicine.  · You have a fever   or shaking chills.  · The severity or intensity of pain increases over 18 hours and is not relieved by pain medicine.  · You develop a new onset of abdominal pain.  · You feel faint or pass out.  · You are unable to urinate.  MAKE SURE YOU:   · Understand these instructions.  · Will watch your condition.  · Will get help right away if you are not doing well or get worse.  Document Released: 06/30/2005 Document Revised: 03/02/2013 Document Reviewed: 12/01/2012  ExitCare® Patient Information ©2014  [employer].

## 2013-09-01 NOTE — ED Notes (Signed)
First contact with pt. Pt ambulates without distress.

## 2013-09-01 NOTE — Telephone Encounter (Signed)
Patient saw Dr. Rayann Heman earlier this month and was to call back after she decided if she wanted to proceed with an ablation.  She tells me today that she does want to proceed with this, and would like a phone call to get this set up.  We are currently getting weekly INRs on her.  Her phone number is [phone].

## 2013-09-02 NOTE — ED Provider Notes (Signed)
I have personally seen and examined the patient.  I have discussed the plan of care with the resident.  I have reviewed the documentation on PMH/FH/Soc. History.  I have reviewed the documentation of the resident and agree.   Date: 08/31/2013  Rate: 77  Rhythm: atrial flutter  QRS Axis: normal  Intervals: normal  ST/T Wave abnormalities: nonspecific ST changes  Conduction Disutrbances:none  Narrative Interpretation:   Old EKG Reviewed: unchanged    Sharyon Cable, MD 09/02/13 1017

## 2013-09-07 ENCOUNTER — Other Ambulatory Visit (INDEPENDENT_AMBULATORY_CARE_PROVIDER_SITE_OTHER): Payer: Medicare Other

## 2013-09-07 ENCOUNTER — Ambulatory Visit (INDEPENDENT_AMBULATORY_CARE_PROVIDER_SITE_OTHER): Payer: Medicare Other | Admitting: Pharmacist

## 2013-09-07 DIAGNOSIS — I4892 Unspecified atrial flutter: Secondary | ICD-10-CM

## 2013-09-07 DIAGNOSIS — I4891 Unspecified atrial fibrillation: Secondary | ICD-10-CM

## 2013-09-07 LAB — BASIC METABOLIC PANEL
BUN: 24 mg/dL — ABNORMAL HIGH (ref 6–23)
CO2: 25 mEq/L (ref 19–32)
Calcium: 10 mg/dL (ref 8.4–10.5)
Chloride: 105 mEq/L (ref 96–112)
Creatinine, Ser: 1 mg/dL (ref 0.4–1.2)
GFR: 61.59 mL/min (ref >60.00)
GLUCOSE: 103 mg/dL — AB (ref 70–99)
POTASSIUM: 3.7 meq/L (ref 3.5–5.1)
SODIUM: 137 meq/L (ref 135–145)

## 2013-09-07 LAB — POCT INR: INR: 4.1

## 2013-09-08 ENCOUNTER — Other Ambulatory Visit: Payer: Medicare Other

## 2013-09-09 ENCOUNTER — Telehealth: Payer: Self-pay

## 2013-09-09 NOTE — Telephone Encounter (Signed)
pt given lab results.  Labs are stable. pt verbalized understanding.   Pt has decided to go through with cardiac ablation after consult with Dr.Allred.adv pt I would fwd message to Dr.Allred's nurse Claiborne Billings.pt verbalized understanding

## 2013-09-09 NOTE — Telephone Encounter (Signed)
Message copied by Lamar Laundry on Fri Sep 09, 2013 11:11 AM ------      Message from: Daneen Schick      Created: Thu Sep 08, 2013  3:26 PM       Labs are stable. ------

## 2013-09-12 NOTE — Telephone Encounter (Signed)
Spoke with patient and let her know we could arrange for 10/07/13  TEE and ablation same day

## 2013-09-13 NOTE — Telephone Encounter (Signed)
Spoke with patient and she is scheduled for 10/06/13.  Tee same day.  Will have pre-procedure labs on 09/30/13

## 2013-09-15 ENCOUNTER — Ambulatory Visit (INDEPENDENT_AMBULATORY_CARE_PROVIDER_SITE_OTHER): Payer: Medicare Other | Admitting: *Deleted

## 2013-09-15 ENCOUNTER — Encounter: Payer: Self-pay | Admitting: *Deleted

## 2013-09-15 DIAGNOSIS — I4891 Unspecified atrial fibrillation: Secondary | ICD-10-CM

## 2013-09-15 LAB — POCT INR: INR: 4

## 2013-09-22 ENCOUNTER — Ambulatory Visit (INDEPENDENT_AMBULATORY_CARE_PROVIDER_SITE_OTHER): Payer: Medicare Other | Admitting: Pharmacist

## 2013-09-22 ENCOUNTER — Encounter: Payer: Medicare Other | Admitting: Internal Medicine

## 2013-09-22 DIAGNOSIS — I4891 Unspecified atrial fibrillation: Secondary | ICD-10-CM

## 2013-09-22 LAB — POCT INR: INR: 1.1

## 2013-09-27 ENCOUNTER — Other Ambulatory Visit: Payer: Self-pay | Admitting: *Deleted

## 2013-09-27 ENCOUNTER — Ambulatory Visit (INDEPENDENT_AMBULATORY_CARE_PROVIDER_SITE_OTHER): Payer: Medicare Other

## 2013-09-27 DIAGNOSIS — I4891 Unspecified atrial fibrillation: Secondary | ICD-10-CM

## 2013-09-27 LAB — POCT INR: INR: 2.9

## 2013-09-30 ENCOUNTER — Other Ambulatory Visit (INDEPENDENT_AMBULATORY_CARE_PROVIDER_SITE_OTHER): Payer: Medicare Other

## 2013-09-30 ENCOUNTER — Encounter (HOSPITAL_COMMUNITY): Payer: Self-pay | Admitting: Pharmacy Technician

## 2013-09-30 DIAGNOSIS — I4891 Unspecified atrial fibrillation: Secondary | ICD-10-CM

## 2013-09-30 LAB — BASIC METABOLIC PANEL
BUN: 22 mg/dL (ref 6–23)
CO2: 30 mEq/L (ref 19–32)
CREATININE: 0.9 mg/dL (ref 0.4–1.2)
Calcium: 10.4 mg/dL (ref 8.4–10.5)
Chloride: 105 mEq/L (ref 96–112)
GFR: 66.4 mL/min (ref >60.00)
Glucose, Bld: 113 mg/dL — ABNORMAL HIGH (ref 70–99)
Potassium: 4.1 mEq/L (ref 3.5–5.1)
Sodium: 141 mEq/L (ref 135–145)

## 2013-09-30 LAB — CBC WITH DIFFERENTIAL/PLATELET
Basophils Absolute: 0 10*3/uL (ref 0.0–0.1)
Basophils Relative: 0.5 % (ref 0.0–3.0)
EOS ABS: 0.1 10*3/uL (ref 0.0–0.7)
EOS PCT: 2.1 % (ref 0.0–5.0)
HCT: 42.6 % (ref 36.0–46.0)
Hemoglobin: 14.2 g/dL (ref 12.0–15.0)
Lymphocytes Relative: 32.9 % (ref 12.0–46.0)
Lymphs Abs: 2 10*3/uL (ref 0.7–4.0)
MCHC: 33.3 g/dL (ref 30.0–36.0)
MCV: 88.2 fl (ref 78.0–100.0)
Monocytes Absolute: 0.7 10*3/uL (ref 0.1–1.0)
Monocytes Relative: 11.3 % (ref 3.0–12.0)
NEUTROS PCT: 53.2 % (ref 43.0–77.0)
Neutro Abs: 3.2 10*3/uL (ref 1.4–7.7)
PLATELETS: 165 10*3/uL (ref 150.0–400.0)
RBC: 4.83 Mil/uL (ref 3.87–5.11)
RDW: 14 % (ref 11.5–14.6)
WBC: 6.1 10*3/uL (ref 4.5–10.5)

## 2013-09-30 LAB — PROTIME-INR
INR: 3.1 ratio — AB (ref 0.8–1.0)
PROTHROMBIN TIME: 31.6 s — AB (ref 10.2–12.4)

## 2013-10-04 ENCOUNTER — Ambulatory Visit (INDEPENDENT_AMBULATORY_CARE_PROVIDER_SITE_OTHER): Payer: Medicare Other | Admitting: Pharmacist Clinician (PhC)/ Clinical Pharmacy Specialist

## 2013-10-04 DIAGNOSIS — I4891 Unspecified atrial fibrillation: Secondary | ICD-10-CM

## 2013-10-04 LAB — POCT INR: INR: 3.4

## 2013-10-06 ENCOUNTER — Ambulatory Visit (INDEPENDENT_AMBULATORY_CARE_PROVIDER_SITE_OTHER): Payer: Medicare Other | Admitting: Pharmacist

## 2013-10-06 DIAGNOSIS — I4891 Unspecified atrial fibrillation: Secondary | ICD-10-CM

## 2013-10-06 LAB — POCT INR: INR: 3

## 2013-10-07 ENCOUNTER — Encounter (HOSPITAL_COMMUNITY)
Admission: RE | Disposition: A | Discharge status: Home / Self Care | Payer: Self-pay | Source: Ambulatory Visit | Attending: Internal Medicine

## 2013-10-07 ENCOUNTER — Ambulatory Visit (HOSPITAL_COMMUNITY)
Admission: RE | Admit: 2013-10-07 | Discharge: 2013-10-07 | Disposition: A | Discharge status: Home / Self Care | Payer: Medicare Other | Source: Ambulatory Visit | Attending: Internal Medicine | Admitting: Internal Medicine

## 2013-10-07 ENCOUNTER — Encounter (HOSPITAL_COMMUNITY): Payer: Self-pay | Admitting: *Deleted

## 2013-10-07 DIAGNOSIS — I4891 Unspecified atrial fibrillation: Secondary | ICD-10-CM | POA: Insufficient documentation

## 2013-10-07 DIAGNOSIS — Z538 Procedure and treatment not carried out for other reasons: Secondary | ICD-10-CM | POA: Insufficient documentation

## 2013-10-07 LAB — PROTIME-INR
INR: 3.02 — ABNORMAL HIGH (ref 0.00–1.49)
Prothrombin Time: 30.2 seconds — ABNORMAL HIGH (ref 11.6–15.2)

## 2013-10-07 SURGERY — CANCELLED PROCEDURE

## 2013-10-07 SURGERY — ATRIAL FIBRILLATION ABLATION
Anesthesia: Monitor Anesthesia Care

## 2013-10-07 MED ORDER — SODIUM CHLORIDE 0.9 % IV SOLN
INTRAVENOUS | Status: DC
  Administered 2013-10-07: 08:00:00 via INTRAVENOUS

## 2013-10-10 ENCOUNTER — Telehealth: Payer: Self-pay | Admitting: *Deleted

## 2013-10-10 ENCOUNTER — Ambulatory Visit: Payer: Medicare Other | Admitting: Interventional Cardiology

## 2013-10-10 ENCOUNTER — Other Ambulatory Visit: Payer: Self-pay | Admitting: *Deleted

## 2013-10-10 ENCOUNTER — Encounter: Payer: Self-pay | Admitting: *Deleted

## 2013-10-10 DIAGNOSIS — I4891 Unspecified atrial fibrillation: Secondary | ICD-10-CM

## 2013-10-10 DIAGNOSIS — Z01812 Encounter for preprocedural laboratory examination: Secondary | ICD-10-CM

## 2013-10-10 NOTE — Telephone Encounter (Signed)
Called pt to inform/discuss A Fib ablation scheduled 4/28 at 7:30 am, be at hospital at 5:30am. Pre procedure lab work to be completed 4/21 Instructions reviewed with pt and letter of instructions mailed to home address. Pt asking about TEE - I explained that Claiborne Billings, RN would be contacting her to schedule this. Patient verbalized understanding and agreeable to plan.

## 2013-10-12 DIAGNOSIS — N200 Calculus of kidney: Secondary | ICD-10-CM

## 2013-10-12 HISTORY — DX: Calculus of kidney: N20.0

## 2013-10-13 ENCOUNTER — Ambulatory Visit (INDEPENDENT_AMBULATORY_CARE_PROVIDER_SITE_OTHER): Payer: Medicare Other | Admitting: Pharmacist

## 2013-10-13 ENCOUNTER — Other Ambulatory Visit: Payer: Self-pay | Admitting: *Deleted

## 2013-10-13 DIAGNOSIS — I4891 Unspecified atrial fibrillation: Secondary | ICD-10-CM

## 2013-10-13 LAB — POCT INR: INR: 3.3

## 2013-10-18 ENCOUNTER — Ambulatory Visit (INDEPENDENT_AMBULATORY_CARE_PROVIDER_SITE_OTHER): Payer: Medicare Other | Admitting: Pharmacist

## 2013-10-18 DIAGNOSIS — I4891 Unspecified atrial fibrillation: Secondary | ICD-10-CM

## 2013-10-18 LAB — POCT INR: INR: 2.8

## 2013-10-25 ENCOUNTER — Ambulatory Visit (INDEPENDENT_AMBULATORY_CARE_PROVIDER_SITE_OTHER): Payer: Medicare Other | Admitting: Pharmacist

## 2013-10-25 DIAGNOSIS — I4891 Unspecified atrial fibrillation: Secondary | ICD-10-CM

## 2013-10-25 LAB — POCT INR: INR: 2.5

## 2013-10-27 ENCOUNTER — Encounter (HOSPITAL_COMMUNITY): Payer: Self-pay | Admitting: Pharmacy Technician

## 2013-11-01 ENCOUNTER — Ambulatory Visit (INDEPENDENT_AMBULATORY_CARE_PROVIDER_SITE_OTHER): Payer: Medicare Other | Admitting: *Deleted

## 2013-11-01 ENCOUNTER — Ambulatory Visit (INDEPENDENT_AMBULATORY_CARE_PROVIDER_SITE_OTHER): Payer: Medicare Other | Admitting: Pharmacist

## 2013-11-01 DIAGNOSIS — Z01812 Encounter for preprocedural laboratory examination: Secondary | ICD-10-CM

## 2013-11-01 DIAGNOSIS — I4891 Unspecified atrial fibrillation: Secondary | ICD-10-CM

## 2013-11-01 DIAGNOSIS — Z79899 Other long term (current) drug therapy: Secondary | ICD-10-CM

## 2013-11-01 LAB — CBC WITH DIFFERENTIAL/PLATELET
Basophils Absolute: 0 10*3/uL (ref 0.0–0.1)
Basophils Relative: 0.5 % (ref 0.0–3.0)
Eosinophils Absolute: 0.2 10*3/uL (ref 0.0–0.7)
Eosinophils Relative: 2.5 % (ref 0.0–5.0)
HEMATOCRIT: 42 % (ref 36.0–46.0)
Hemoglobin: 14.1 g/dL (ref 12.0–15.0)
LYMPHS ABS: 1.9 10*3/uL (ref 0.7–4.0)
Lymphocytes Relative: 24.2 % (ref 12.0–46.0)
MCHC: 33.5 g/dL (ref 30.0–36.0)
MCV: 87.6 fl (ref 78.0–100.0)
MONO ABS: 0.7 10*3/uL (ref 0.1–1.0)
Monocytes Relative: 8.7 % (ref 3.0–12.0)
Neutro Abs: 5.1 10*3/uL (ref 1.4–7.7)
Neutrophils Relative %: 64.1 % (ref 43.0–77.0)
PLATELETS: 178 10*3/uL (ref 150.0–400.0)
RBC: 4.8 Mil/uL (ref 3.87–5.11)
RDW: 14.2 % (ref 11.5–14.6)
WBC: 7.9 10*3/uL (ref 4.5–10.5)

## 2013-11-01 LAB — BASIC METABOLIC PANEL
BUN: 16 mg/dL (ref 6–23)
CHLORIDE: 105 meq/L (ref 96–112)
CO2: 27 meq/L (ref 19–32)
Calcium: 10.1 mg/dL (ref 8.4–10.5)
Creatinine, Ser: 1 mg/dL (ref 0.4–1.2)
GFR: 58.03 mL/min — AB (ref >60.00)
Glucose, Bld: 105 mg/dL — ABNORMAL HIGH (ref 70–99)
POTASSIUM: 3.5 meq/L (ref 3.5–5.1)
SODIUM: 140 meq/L (ref 135–145)

## 2013-11-01 LAB — POCT INR: INR: 2.8

## 2013-11-04 ENCOUNTER — Encounter (HOSPITAL_COMMUNITY): Payer: Self-pay | Admitting: Pharmacy Technician

## 2013-11-07 ENCOUNTER — Ambulatory Visit (HOSPITAL_COMMUNITY)
Admission: RE | Admit: 2013-11-07 | Discharge: 2013-11-07 | Disposition: A | Discharge status: Home / Self Care | Payer: Medicare Other | Source: Ambulatory Visit | Attending: Internal Medicine | Admitting: Internal Medicine

## 2013-11-07 ENCOUNTER — Encounter (HOSPITAL_COMMUNITY)
Admission: RE | Disposition: A | Discharge status: Home / Self Care | Payer: Self-pay | Source: Ambulatory Visit | Attending: Internal Medicine

## 2013-11-07 ENCOUNTER — Ambulatory Visit (INDEPENDENT_AMBULATORY_CARE_PROVIDER_SITE_OTHER): Payer: Medicare Other

## 2013-11-07 ENCOUNTER — Encounter (HOSPITAL_COMMUNITY): Payer: Self-pay

## 2013-11-07 ENCOUNTER — Other Ambulatory Visit: Payer: Self-pay | Admitting: Interventional Cardiology

## 2013-11-07 DIAGNOSIS — Z950 Presence of cardiac pacemaker: Secondary | ICD-10-CM

## 2013-11-07 DIAGNOSIS — I059 Rheumatic mitral valve disease, unspecified: Secondary | ICD-10-CM

## 2013-11-07 DIAGNOSIS — Z9889 Other specified postprocedural states: Secondary | ICD-10-CM

## 2013-11-07 DIAGNOSIS — I4891 Unspecified atrial fibrillation: Secondary | ICD-10-CM | POA: Insufficient documentation

## 2013-11-07 DIAGNOSIS — I079 Rheumatic tricuspid valve disease, unspecified: Secondary | ICD-10-CM | POA: Insufficient documentation

## 2013-11-07 DIAGNOSIS — I253 Aneurysm of heart: Secondary | ICD-10-CM | POA: Insufficient documentation

## 2013-11-07 HISTORY — PX: TEE WITHOUT CARDIOVERSION: SHX5443

## 2013-11-07 LAB — POCT INR: INR: 2

## 2013-11-07 SURGERY — ECHOCARDIOGRAM, TRANSESOPHAGEAL
Anesthesia: Moderate Sedation

## 2013-11-07 MED ORDER — FENTANYL CITRATE 0.05 MG/ML IJ SOLN
INTRAMUSCULAR | Status: DC | PRN
  Administered 2013-11-07 (×2): 25 ug via INTRAVENOUS

## 2013-11-07 MED ORDER — FENTANYL CITRATE 0.05 MG/ML IJ SOLN
INTRAMUSCULAR | Status: AC
  Filled 2013-11-07: qty 2

## 2013-11-07 MED ORDER — MIDAZOLAM HCL 10 MG/2ML IJ SOLN
INTRAMUSCULAR | Status: DC | PRN
  Administered 2013-11-07 (×3): 2 mg via INTRAVENOUS

## 2013-11-07 MED ORDER — LIDOCAINE VISCOUS 2 % MT SOLN
OROMUCOSAL | Status: AC
  Filled 2013-11-07: qty 15

## 2013-11-07 MED ORDER — LIDOCAINE VISCOUS 2 % MT SOLN
OROMUCOSAL | Status: DC | PRN
  Administered 2013-11-07: 10 mL via OROMUCOSAL

## 2013-11-07 MED ORDER — SODIUM CHLORIDE 0.9 % IV SOLN
INTRAVENOUS | Status: DC
  Administered 2013-11-07: 500 mL via INTRAVENOUS

## 2013-11-07 MED ORDER — MIDAZOLAM HCL 5 MG/ML IJ SOLN
INTRAMUSCULAR | Status: AC
  Filled 2013-11-07: qty 2

## 2013-11-07 NOTE — H&P (Signed)
     INTERVAL PROCEDURE H&P  History and Physical Interval Note:  11/07/2013 1:14 PM  Jo Hughes has presented today for their planned procedure. The various methods of treatment have been discussed with the patient and family. After consideration of risks, benefits and other options for treatment, the patient has consented to the procedure.  The patients' outpatient history has been reviewed, patient examined, and no change in status from most recent office note within the past 30 days. I have reviewed the patients' chart and labs and will proceed as planned. Questions were answered to the patient's satisfaction.   Pixie Casino, MD, [facility] Attending Cardiologist [place] 11/07/2013, 1:14 PM

## 2013-11-07 NOTE — Discharge Instructions (Addendum)
Transesophageal Echocardiography Transesophageal echocardiography (TEE) is a picture test of your heart using sound waves. The pictures taken can give very detailed pictures of your heart. This can help your doctor see if there are problems with your heart. TEE can check:  If your heart has blood clots in it.  How well your heart valves are working.  If you have an infection on the inside of your heart.  Some of the major arteries of your heart.  If your heart valve is working after a Office manager.  Your heart before a procedure that uses a shock to your heart to get the rhythm back to normal. BEFORE THE PROCEDURE  Do not eat or drink for 6 hours before the procedure or as told by your doctor.  Make plans to have someone drive you home after the procedure. Do not drive yourself home.  An IV tube will be put in your arm. PROCEDURE  You will be given a medicine to help you relax (sedative). It will be given through the IV tube.  A numbing medicine will be sprayed in the back of your throat to help numb it.  The tip of the probe is placed into the back of your mouth. You will be asked to swallow. This helps to pass the probe into your esophagus.  Once the tip of the probe is in the right place, your doctor can take pictures of your heart.  You may feel pressure at the back of your throat. AFTER THE PROCEDURE  You will be taken to a recovery area so the sedative can wear off.  Your throat may be sore and scratchy. This will go away slowly over time.  You will go home when you are fully awake and able to swallow liquids.  You should have someone stay with you for the next 24 hours. Document Released: 04/27/2009 Document Revised: 04/20/2013 Document Reviewed: 12/30/2012 [facility] Patient Information 2014 [place], [place].  Conscious Sedation, Adult, Care After Refer to this sheet in the next few weeks. These instructions provide you with information on caring for yourself after your  procedure. Your health care provider may also give you more specific instructions. Your treatment has been planned according to current medical practices, but problems sometimes occur. Call your health care provider if you have any problems or questions after your procedure. WHAT TO EXPECT AFTER THE PROCEDURE  After your procedure:  You may feel sleepy, clumsy, and have poor balance for several hours.  Vomiting may occur if you eat too soon after the procedure. HOME CARE INSTRUCTIONS  Do not participate in any activities where you could become injured for at least 24 hours. Do not:  Drive.  Swim.  Ride a bicycle.  Operate heavy machinery.  Cook.  Use power tools.  Climb ladders.  Work from a high place.  Do not make important decisions or sign legal documents until you are improved.  If you vomit, drink water, juice, or soup when you can drink without vomiting. Make sure you have little or no nausea before eating solid foods.  Only take over-the-counter or prescription medicines for pain, discomfort, or fever as directed by your health care provider.  Make sure you and your family fully understand everything about the medicines given to you, including what side effects may occur.  You should not drink alcohol, take sleeping pills, or take medicines that cause drowsiness for at least 24 hours.  If you smoke, do not smoke without supervision.  If you are  better, you may resume normal activities 24 hours after you were sedated. °· Keep all appointments with your health care provider. °SEEK MEDICAL CARE IF: °· Your skin is pale or bluish in color. °· You continue to feel nauseous or vomit. °· Your pain is getting worse and is not helped by medicine. °· You have bleeding or swelling. °· You are still sleepy or feeling clumsy after 24 hours. °SEEK IMMEDIATE MEDICAL CARE IF: °· You develop a rash. °· You have difficulty breathing. °· You develop any type of allergic  problem. °· You have a fever. °MAKE SURE YOU: °· Understand these instructions. °· Will watch your condition. °· Will get help right away if you are not doing well or get worse. °Document Released: 04/20/2013 Document Reviewed: 02/04/2013 °ExitCare® Patient Information ©2014 [employer]. ° °

## 2013-11-07 NOTE — Progress Notes (Signed)
Echocardiogram Echocardiogram Transesophageal has been performed.  Alyson Locket Albertus Chiarelli 11/07/2013, 2:10 PM

## 2013-11-07 NOTE — CV Procedure (Signed)
    TRANSESOPHAGEAL ECHOCARDIOGRAM (TEE) NOTE  INDICATIONS: a-fib  PROCEDURE:   Informed consent was obtained prior to the procedure. The risks, benefits and alternatives for the procedure were discussed and the patient comprehended these risks.  Risks include, but are not limited to, cough, sore throat, vomiting, nausea, somnolence, esophageal and stomach trauma or perforation, bleeding, low blood pressure, aspiration, pneumonia, infection, trauma to the teeth and death.    After a procedural time-out, the patient was given 6 mg versed and 50 mcg fentanyl for moderate sedation.  The oropharynx was anesthetized 10 cc of topical 1% viscous lidocaine.  The transesophageal probe was inserted in the esophagus and stomach without difficulty and multiple views were obtained.  The patient was kept under observation until the patient left the procedure room.  The patient left the procedure room in stable condition.   Agitated microbubble saline contrast was administered.  COMPLICATIONS:    There were no immediate complications.  Findings:  1. LEFT VENTRICLE: The left ventricular wall thickness is mildly hypertrophied.  The left ventricular cavity is normal in size. Wall motion is normal.  LVEF is ~50%.  2. RIGHT VENTRICLE:  The right ventricle is normal in structure and function without any thrombus or masses.  There are pacing wires in the RV.   3. LEFT ATRIUM:  The left atrium is severely dilated in size without any thrombus or masses.  There is spontaneous echo contrast ("smoke") in the left atrium consistent with a low flow state.  4. LEFT ATRIAL APPENDAGE:  The left atrial appendage is surgically excised.  5. ATRIAL SEPTUM:  The atrial septum appears intact and is free of thrombus and/or masses.  There is no evidence for interatrial shunting by color doppler and saline microbubble. 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  6. RIGHT ATRIUM:  The right atrium is dilated without any thrombus or masses.  Spontaneous echo contrast is noted. There are pacing wires in the right atrium.  7. MITRAL VALVE:  The mitral valve has an intact annuloplasty ring (Cosgrove 28 mm) in place. There is a more fixed posterior leaflet with Mild, posteriorly directed regurgitation.  There were no vegetations or stenosis.  8. AORTIC VALVE:  The aortic valve is normal in structure and function with no regurgitation.  There were no vegetations or stenosis  9. TRICUSPID VALVE:  The tricuspid valve has been repaired and there is Moderate regurgitation, mostly due to pacing wires leading to malcoaptation.  There were no vegetations or stenosis  10.  PULMONIC VALVE:  The pulmonic valve is normal in structure and function with trivial regurgitation.  There were no vegetations or stenosis.   11. AORTIC ARCH, ASCENDING AND DESCENDING AORTA:  The aorta was not well-visualized.  IMPRESSION:   1. Surgically excised / oversewn LAA. 2. Heavy smoke in both atriums. 3. 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 4.   LVEF ~50%. 5.   Stable mitral annuloplasty ring (Cosgrove 28 mm) with mild, posteriorly directed MR. 6.   Moderate TR - due to pacing wires, however, prior TV repair is noted  RECOMMENDATIONS:    1.  Ok to proceed with a-fib ablation tomorrow with Dr. Rayann Heman.  Time Spent Directly with the Patient:  60 minutes   Pixie Casino, MD, [facility] Attending Cardiologist [facility]  11/07/2013, 2:16 PM

## 2013-11-08 ENCOUNTER — Encounter (HOSPITAL_COMMUNITY)
Admission: RE | Disposition: A | Discharge status: Home / Self Care | Payer: Medicare Other | Source: Ambulatory Visit | Attending: Internal Medicine

## 2013-11-08 ENCOUNTER — Encounter (HOSPITAL_COMMUNITY): Payer: Medicare Other | Admitting: Anesthesiology

## 2013-11-08 ENCOUNTER — Encounter (HOSPITAL_COMMUNITY): Payer: Self-pay | Admitting: Internal Medicine

## 2013-11-08 ENCOUNTER — Ambulatory Visit (HOSPITAL_COMMUNITY)
Admission: RE | Admit: 2013-11-08 | Discharge: 2013-11-09 | Disposition: A | Discharge status: Home / Self Care | Payer: Medicare Other | Source: Ambulatory Visit | Attending: Internal Medicine | Admitting: Internal Medicine

## 2013-11-08 ENCOUNTER — Ambulatory Visit (HOSPITAL_COMMUNITY): Payer: Medicare Other | Admitting: Anesthesiology

## 2013-11-08 DIAGNOSIS — I5032 Chronic diastolic (congestive) heart failure: Secondary | ICD-10-CM | POA: Insufficient documentation

## 2013-11-08 DIAGNOSIS — R001 Bradycardia, unspecified: Secondary | ICD-10-CM

## 2013-11-08 DIAGNOSIS — I4891 Unspecified atrial fibrillation: Secondary | ICD-10-CM | POA: Insufficient documentation

## 2013-11-08 DIAGNOSIS — F411 Generalized anxiety disorder: Secondary | ICD-10-CM | POA: Insufficient documentation

## 2013-11-08 DIAGNOSIS — E785 Hyperlipidemia, unspecified: Secondary | ICD-10-CM | POA: Insufficient documentation

## 2013-11-08 DIAGNOSIS — Z87891 Personal history of nicotine dependence: Secondary | ICD-10-CM | POA: Insufficient documentation

## 2013-11-08 DIAGNOSIS — Z954 Presence of other heart-valve replacement: Secondary | ICD-10-CM | POA: Insufficient documentation

## 2013-11-08 DIAGNOSIS — E039 Hypothyroidism, unspecified: Secondary | ICD-10-CM | POA: Insufficient documentation

## 2013-11-08 DIAGNOSIS — H409 Unspecified glaucoma: Secondary | ICD-10-CM | POA: Insufficient documentation

## 2013-11-08 DIAGNOSIS — G43909 Migraine, unspecified, not intractable, without status migrainosus: Secondary | ICD-10-CM | POA: Insufficient documentation

## 2013-11-08 DIAGNOSIS — F329 Major depressive disorder, single episode, unspecified: Secondary | ICD-10-CM | POA: Insufficient documentation

## 2013-11-08 DIAGNOSIS — I484 Atypical atrial flutter: Secondary | ICD-10-CM

## 2013-11-08 DIAGNOSIS — I4892 Unspecified atrial flutter: Secondary | ICD-10-CM | POA: Insufficient documentation

## 2013-11-08 DIAGNOSIS — H3530 Unspecified macular degeneration: Secondary | ICD-10-CM | POA: Insufficient documentation

## 2013-11-08 DIAGNOSIS — I509 Heart failure, unspecified: Secondary | ICD-10-CM | POA: Insufficient documentation

## 2013-11-08 DIAGNOSIS — F3289 Other specified depressive episodes: Secondary | ICD-10-CM | POA: Insufficient documentation

## 2013-11-08 DIAGNOSIS — Z950 Presence of cardiac pacemaker: Secondary | ICD-10-CM | POA: Insufficient documentation

## 2013-11-08 DIAGNOSIS — Z7901 Long term (current) use of anticoagulants: Secondary | ICD-10-CM | POA: Insufficient documentation

## 2013-11-08 HISTORY — PX: ATRIAL FIBRILLATION ABLATION: SHX5456

## 2013-11-08 LAB — CBC
HCT: 39.6 % (ref 36.0–46.0)
Hemoglobin: 13 g/dL (ref 12.0–15.0)
MCH: 29.2 pg (ref 26.0–34.0)
MCHC: 32.8 g/dL (ref 30.0–36.0)
MCV: 89 fL (ref 78.0–100.0)
PLATELETS: 154 10*3/uL (ref 150–400)
RBC: 4.45 MIL/uL (ref 3.87–5.11)
RDW: 14 % (ref 11.5–15.5)
WBC: 10.1 10*3/uL (ref 4.0–10.5)

## 2013-11-08 LAB — TYPE AND SCREEN
ABO/RH(D): O POS
Antibody Screen: NEGATIVE

## 2013-11-08 LAB — POCT ACTIVATED CLOTTING TIME
ACTIVATED CLOTTING TIME: 371 s
Activated Clotting Time: 420 seconds
Activated Clotting Time: 326 seconds
Activated Clotting Time: 144 seconds

## 2013-11-08 LAB — MRSA PCR SCREENING: MRSA by PCR: NEGATIVE

## 2013-11-08 LAB — PROTIME-INR
INR: 1.71 — ABNORMAL HIGH (ref 0.00–1.49)
Prothrombin Time: 19.6 seconds — ABNORMAL HIGH (ref 11.6–15.2)

## 2013-11-08 SURGERY — ATRIAL FIBRILLATION ABLATION
Anesthesia: General

## 2013-11-08 MED ORDER — WARFARIN SODIUM 5 MG PO TABS
5.0000 mg | ORAL_TABLET | ORAL | Status: AC
Start: 2013-11-08 — End: 2013-11-08
  Administered 2013-11-08: 5 mg via ORAL
  Filled 2013-11-08: qty 1

## 2013-11-08 MED ORDER — WARFARIN SODIUM 2.5 MG PO TABS
2.5000 mg | ORAL_TABLET | ORAL | Status: AC
  Administered 2013-11-08: 2.5 mg via ORAL
  Filled 2013-11-08: qty 1

## 2013-11-08 MED ORDER — HEPARIN SODIUM (PORCINE) 1000 UNIT/ML IJ SOLN
INTRAMUSCULAR | Status: DC | PRN
  Administered 2013-11-08: 1000 [IU] via INTRAVENOUS

## 2013-11-08 MED ORDER — CYCLOSPORINE 0.05 % OP EMUL
1.0000 [drp] | Freq: Two times a day (BID) | OPHTHALMIC | Status: DC
  Administered 2013-11-08 – 2013-11-09 (×2): 1 [drp] via OPHTHALMIC
  Filled 2013-11-08 (×3): qty 1

## 2013-11-08 MED ORDER — BUPIVACAINE HCL (PF) 0.25 % IJ SOLN
INTRAMUSCULAR | Status: AC
  Filled 2013-11-08: qty 30

## 2013-11-08 MED ORDER — PROPOFOL INFUSION 10 MG/ML OPTIME
INTRAVENOUS | Status: DC | PRN
  Administered 2013-11-08: 150 mL via INTRAVENOUS
  Administered 2013-11-08: 40 mL via INTRAVENOUS
  Administered 2013-11-08: 20 mL via INTRAVENOUS

## 2013-11-08 MED ORDER — HYDROCODONE-ACETAMINOPHEN 5-325 MG PO TABS
1.0000 | ORAL_TABLET | ORAL | Status: DC | PRN
  Administered 2013-11-09: 2 via ORAL
  Administered 2013-11-09: 1 via ORAL
  Filled 2013-11-08 (×2): qty 2
  Filled 2013-11-08: qty 1
  Filled 2013-11-08: qty 2

## 2013-11-08 MED ORDER — PROTAMINE SULFATE 10 MG/ML IV SOLN
INTRAVENOUS | Status: DC | PRN
  Administered 2013-11-08: 10 mg via INTRAVENOUS
  Administered 2013-11-08: 20 mg via INTRAVENOUS
  Administered 2013-11-08: 10 mg via INTRAVENOUS

## 2013-11-08 MED ORDER — LEVOTHYROXINE SODIUM 88 MCG PO TABS
88.0000 ug | ORAL_TABLET | Freq: Every day | ORAL | Status: DC
  Administered 2013-11-09: 88 ug via ORAL
  Filled 2013-11-08 (×2): qty 1

## 2013-11-08 MED ORDER — PHENYLEPHRINE HCL 10 MG/ML IJ SOLN
INTRAMUSCULAR | Status: DC | PRN
  Administered 2013-11-08 (×4): 80 ug via INTRAVENOUS

## 2013-11-08 MED ORDER — SODIUM CHLORIDE 0.9 % IJ SOLN: 3.0000 mL | INTRAMUSCULAR | Status: DC | PRN

## 2013-11-08 MED ORDER — POTASSIUM CHLORIDE CRYS ER 20 MEQ PO TBCR
20.0000 meq | EXTENDED_RELEASE_TABLET | Freq: Every day | ORAL | Status: DC
  Administered 2013-11-09: 20 meq via ORAL
  Filled 2013-11-08 (×2): qty 1

## 2013-11-08 MED ORDER — ZOLPIDEM TARTRATE 5 MG PO TABS
10.0000 mg | ORAL_TABLET | Freq: Every evening | ORAL | Status: DC | PRN
Start: 2013-11-08 — End: 2013-11-08

## 2013-11-08 MED ORDER — ONDANSETRON HCL 4 MG/2ML IJ SOLN
INTRAMUSCULAR | Status: DC | PRN
  Administered 2013-11-08: 4 mg via INTRAVENOUS

## 2013-11-08 MED ORDER — ALPRAZOLAM 0.5 MG PO TABS
0.5000 mg | ORAL_TABLET | Freq: Four times a day (QID) | ORAL | Status: DC
  Administered 2013-11-08 – 2013-11-09 (×4): 0.5 mg via ORAL
  Filled 2013-11-08 (×4): qty 1

## 2013-11-08 MED ORDER — LATANOPROST 0.005 % OP SOLN
1.0000 [drp] | Freq: Every day | OPHTHALMIC | Status: DC
  Administered 2013-11-09: 1 [drp] via OPHTHALMIC
  Filled 2013-11-08: qty 2.5

## 2013-11-08 MED ORDER — PAROXETINE HCL ER 37.5 MG PO TB24
37.5000 mg | ORAL_TABLET | Freq: Every day | ORAL | Status: DC
  Administered 2013-11-08 – 2013-11-09 (×2): 37.5 mg via ORAL
  Filled 2013-11-08 (×2): qty 1

## 2013-11-08 MED ORDER — HEPARIN SODIUM (PORCINE) 1000 UNIT/ML IJ SOLN
INTRAMUSCULAR | Status: AC
  Filled 2013-11-08: qty 1

## 2013-11-08 MED ORDER — LACTATED RINGERS IV SOLN
INTRAVENOUS | Status: DC | PRN
  Administered 2013-11-08 (×2): via INTRAVENOUS

## 2013-11-08 MED ORDER — FENTANYL CITRATE 0.05 MG/ML IJ SOLN
INTRAMUSCULAR | Status: DC | PRN
  Administered 2013-11-08: 50 ug via INTRAVENOUS
  Administered 2013-11-08: 25 ug via INTRAVENOUS

## 2013-11-08 MED ORDER — WARFARIN SODIUM 5 MG PO TABS
5.0000 mg | ORAL_TABLET | ORAL | Status: DC
  Filled 2013-11-08: qty 1

## 2013-11-08 MED ORDER — MIDAZOLAM HCL 5 MG/5ML IJ SOLN
INTRAMUSCULAR | Status: DC | PRN
  Administered 2013-11-08: 2 mg via INTRAVENOUS

## 2013-11-08 MED ORDER — ZOLPIDEM TARTRATE 5 MG PO TABS: 5.0000 mg | ORAL_TABLET | Freq: Every evening | ORAL | Status: DC | PRN

## 2013-11-08 MED ORDER — DORZOLAMIDE HCL-TIMOLOL MAL 2-0.5 % OP SOLN
1.0000 [drp] | Freq: Two times a day (BID) | OPHTHALMIC | Status: DC
  Administered 2013-11-08 – 2013-11-09 (×2): 1 [drp] via OPHTHALMIC
  Filled 2013-11-08: qty 10

## 2013-11-08 MED ORDER — SODIUM CHLORIDE 0.9 % IJ SOLN
3.0000 mL | Freq: Two times a day (BID) | INTRAMUSCULAR | Status: DC
  Administered 2013-11-08 – 2013-11-09 (×2): 3 mL via INTRAVENOUS

## 2013-11-08 MED ORDER — WARFARIN - PHYSICIAN DOSING INPATIENT
Freq: Every day | Status: DC
  Administered 2013-11-08: 18:00:00

## 2013-11-08 MED ORDER — EPHEDRINE SULFATE 50 MG/ML IJ SOLN
INTRAMUSCULAR | Status: DC | PRN
  Administered 2013-11-08 (×2): 10 mg via INTRAVENOUS

## 2013-11-08 MED ORDER — SODIUM CHLORIDE 0.9 % IV SOLN: 250.0000 mL | INTRAVENOUS | Status: DC | PRN

## 2013-11-08 MED ORDER — TRAMADOL HCL 50 MG PO TABS: 50.0000 mg | ORAL_TABLET | Freq: Four times a day (QID) | ORAL | Status: DC | PRN

## 2013-11-08 MED ORDER — MAGNESIUM SULFATE 50 % IJ SOLN
INTRAMUSCULAR | Status: AC
  Filled 2013-11-08: qty 2

## 2013-11-08 MED ORDER — LIDOCAINE HCL (CARDIAC) 20 MG/ML IV SOLN
INTRAVENOUS | Status: DC | PRN
  Administered 2013-11-08: 60 mg via INTRAVENOUS

## 2013-11-08 MED ORDER — CYCLOSPORINE 0.05 % OP EMUL
1.0000 [drp] | Freq: Two times a day (BID) | OPHTHALMIC | Status: DC
  Filled 2013-11-08 (×2): qty 1

## 2013-11-08 NOTE — Progress Notes (Signed)
Utilization Review Completed.Neoma Laming T Dowell4/28/2015

## 2013-11-08 NOTE — Progress Notes (Signed)
Admitted from the cath lab by bed awake and alert.Bed rest emphasized.Instructed to press on right groin dressing when coughing , sneezing

## 2013-11-08 NOTE — Discharge Summary (Signed)
ELECTROPHYSIOLOGY PROCEDURE DISCHARGE SUMMARY    Patient ID: Jo Hughes,  MRN: [id number], DOB/AGE: October 29, 1940 73 y.o.  Admit date: 11/08/2013 Discharge date: 11/09/2013  Primary Care Physician: Irven Shelling, MD Primary Cardiologist: Tamala Julian Electrophysiologist: Thompson Grayer, MD  Primary Discharge Diagnosis:  Persistent atrial fibrillation  Secondary Discharge Diagnosis:  1.  Prior MVR and TVR 2.  Hyperlipidemia 3.  Glaucoma 4.  Hypothyroidism 5.  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  Electrophysiology study and radiofrequency catheter ablation on 11-08-13 by Dr Thompson Grayer.  This study demonstrated atrial fibrillation upon presentation; rotational Angiography reveals a large sized left atrium with four pulmonary veins without evidence of pulmonary vein stenosis; successful electrical isolation and anatomical encircling of all four pulmonary veins with radiofrequency current; CFAEs were identified and ablated along the roof of the left atrium, the interatrial septum, along the lateral wall of the left atrium, and above the coronary sinus. There was a paucity of CAFEs along the posterior wall; atrial fibrillation successfully cardioverted to sinus rhythm after ibutilide infusion.   Brief HPI: Jo Hughes is a 73 y.o. female with a history of persistent atrial fibrillation.  They have failed medical therapy with amiodarone. Risks, benefits, and alternatives to catheter ablation of atrial fibrillation were reviewed with the patient who wished to proceed.  The patient underwent TEE prior to the procedure which demonstrated normal LV function and a previously oversewn left atrial appendage with smoke but not thrombus identified.    Hospital Course:  The patient was admitted and underwent EPS/RFCA of atrial fibrillation with details as outlined above.  They were monitored on telemetry overnight which demonstrated atrial  fibrillation with ventricular pacing.  Groin was without complication on the day of discharge.  The patient was examined by Dr Rayann Heman and considered to be stable for discharge.  Wound care and restrictions were reviewed with the patient.  The patient will be seen back by Dr Rayann Heman in 3 weeks for post ablation follow up.   Pacemaker interrogation day of discharge demonstrated normal device function - device programmed DDDR 60/120  Discharge Vitals: Blood pressure 96/51, pulse 85, temperature 97.7 F (36.5 C), temperature source Oral, resp. rate 19, height 5\' 4"  (1.626 m), weight 164 lb 14.5 oz (74.8 kg), SpO2 95.00%.   Physical Exam: Filed Vitals:   11/08/13 2330 11/09/13 0000 11/09/13 0321 11/09/13 0803  BP: 114/63  104/54 96/51  Pulse:   81 85  Temp:  98.7 F (37.1 C) 98.6 F (37 C) 97.7 F (36.5 C)  TempSrc:  Oral Oral Oral  Resp: 21   19  Height:      Weight:      SpO2:   99% 95%    GEN- The patient is well appearing, sleeping but arouses.   Head- normocephalic, atraumatic Eyes-  Sclera clear, conjunctiva pink Ears- hearing intact Oropharynx- clear,  Tongue with laceration s/p cardioversion Neck- supple, no JVP Lungs- Clear to ausculation bilaterally, normal work of breathing Heart- irregular rate and rhythm  GI- soft, NT, ND, + BS Extremities- no clubbing, cyanosis, or edema Neuro- strength and sensation are intact   Labs:   Lab Results  Component Value Date   WBC 10.0 11/09/2013   HGB 12.3 11/09/2013   HCT 38.6 11/09/2013   MCV 90.6 11/09/2013   PLT 151 11/09/2013     Recent Labs Lab 11/09/13 0240  NA 144  K 3.8  CL 108  CO2 24  BUN  15  CREATININE 0.89  CALCIUM 9.3  GLUCOSE 121*      Discharge Medications:    Medication List         ALPRAZolam 0.5 MG tablet  Commonly known as:  XANAX  Take 0.5 mg by mouth 4 (four) times daily. For anxiety     CALCIUM 600 + D PO  Take 1 tablet by mouth daily.     cycloSPORINE 0.05 % ophthalmic emulsion    Commonly known as:  RESTASIS  Place 1 drop into both eyes 2 (two) times daily.     dorzolamide-timolol 22.3-6.8 MG/ML ophthalmic solution  Commonly known as:  COSOPT  Place 1 drop into both eyes 2 (two) times daily.     Fish Oil 1000 MG Caps  Take 1,000 mg by mouth 4 (four) times daily - after meals and at bedtime.     furosemide 40 MG tablet  Commonly known as:  LASIX  Take 1.5 tablets (60 mg total) by mouth daily.     hydrocortisone cream 1 %  Apply 1 application topically 3 (three) times daily as needed (skin irritation).     ICAPS Caps  Take 1 capsule by mouth 2 (two) times daily.     latanoprost 0.005 % ophthalmic solution  Commonly known as:  XALATAN  Place 1 drop into both eyes daily.     levothyroxine 88 MCG tablet  Commonly known as:  SYNTHROID, LEVOTHROID  Take 88 mcg by mouth daily.     metoprolol succinate 25 MG 24 hr tablet  Commonly known as:  TOPROL-XL  TAKE 2 TABLETS DAILY.     pantoprazole 40 MG tablet  Commonly known as:  PROTONIX  Take 1 tablet (40 mg total) by mouth daily. For 6 weeks then stop.     PARoxetine 37.5 MG 24 hr tablet  Commonly known as:  PAXIL-CR  Take 37.5 mg by mouth daily.     potassium chloride SA 20 MEQ tablet  Commonly known as:  K-DUR,KLOR-CON  Take 20 mEq by mouth daily.     rosuvastatin 40 MG tablet  Commonly known as:  CRESTOR  Take 40 mg by mouth daily.     traMADol 50 MG tablet  Commonly known as:  ULTRAM  Take 50 mg by mouth every 6 (six) hours as needed. For pain     warfarin 5 MG tablet  Commonly known as:  COUMADIN  Take 2.5-5 mg by mouth daily. Take 1 tablet by mouth on Mon, Wed, and Fri. Take 1/2 tablet all other days     ZIOPTAN 0.0015 % Soln  Generic drug:  Tafluprost  Place 1 drop into both eyes daily.     zolmitriptan 5 MG tablet  Commonly known as:  ZOMIG  Take 5 mg by mouth as needed. For migraines     zolpidem 10 MG tablet  Commonly known as:  AMBIEN  Take 10 mg by mouth at bedtime as  needed. For sleep        Disposition:   Future Appointments Provider Department Dept Phone   11/11/2013 11:30 AM [facility] [place] [facility] [phone]   11/30/2013 4:15 PM Thompson Grayer, MD [place] Office [phone]   12/08/2013 1:30 PM Sinclair Grooms, MD [facility] [phone]     Follow-up Information   Follow up with [facility] [place] [facility] On 11/14/2013. (At 12:00 noon for Coumadin follow-up)    Specialty:  Cardiology   Contact  information:   [address], [address] [place] [address] [phone]      Follow up with Thompson Grayer, MD On 11/30/2013. (At 4:15 PM)    Specialty:  Cardiology   Contact information:   [address] [place] [address] [phone]       Duration of Discharge Encounter: Greater than 30 minutes including physician time.  Army Fossa MD   Addendum Coumadin 2.5mg  po tonight and tomorrow night INR follow-up on Friday with Alferd Apa

## 2013-11-08 NOTE — Progress Notes (Addendum)
Right groin dressing soaked with blood in mod. Amount. Site cleansed, manual pressure applied x 20 min. Pressure dressing applied. Continue to monitor.Md made aware with no order.

## 2013-11-08 NOTE — Anesthesia Postprocedure Evaluation (Signed)
  Anesthesia Post-op Note  Patient: Jo Hughes  Procedure(s) Performed: Procedure(s): ATRIAL FIBRILLATION ABLATION (N/A)  Patient Location: PACU and Cath Lab  Anesthesia Type:General  Level of Consciousness: awake, alert , oriented and patient cooperative  Airway and Oxygen Therapy: Patient Spontanous Breathing and Patient connected to nasal cannula oxygen  Post-op Pain: 2 /10  Post-op Assessment: Post-op Vital signs reviewed and Patient's Cardiovascular Status Stable  Post-op Vital Signs: Reviewed and stable  Last Vitals:  Filed Vitals:   11/08/13 0541  BP: 142/92  Pulse: 84  Temp: 36.6 C  Resp: 20    Complications: No apparent anesthesia complications and soft tissue trauma/ tongue

## 2013-11-08 NOTE — Progress Notes (Signed)
Pads marked noted on the chest, elongated lines red in color, protective cream applied. Denied any discomfort. Continue to monitor.

## 2013-11-08 NOTE — Op Note (Signed)
SURGEON:  Thompson Grayer, MD  PREPROCEDURE DIAGNOSES: 1. Persistent atrial fibrillation.  POSTPROCEDURE DIAGNOSES: 1. Persistent atrial fibrillation.  PROCEDURES: 1. Comprehensive electrophysiologic study. 2. Coronary sinus pacing and recording. 3. Three-dimensional mapping of atrial fibrillation (with additional mapping and ablation of CAFEs within the left atrium) 4. Ablation of atrial fibrillation (with additional mapping and ablation of CAFEs within the left atrium) 5. Intracardiac echocardiography. 6. Transseptal puncture of an intact septum. 7.  Rotational Angiography with processing at an independent workstation 8. Arrhythmia induction with pacing 9.External cardioversion with ibutilide infusion 10. Dual chamber pacemaker interrogation and reprogramming  INTRODUCTION:  Jo Hughes is a 73 y.o. female with a history of persistent atrial fibrillation who now presents for EP study and radiofrequency ablation.  The patient has a h/o valvular heart disease and is s/p prior MVR.  She has a long history of atrial fibrillation.  She has a pacemaker implanted for tachycardia bradycardia syndrome.  The patient has failed medical therapy with amiodarone.  She has had atypical atrial flutter documented in addition to her afib.  The patient therefore presents today for catheter ablation.  DESCRIPTION OF PROCEDURE:  Informed written consent was obtained, and the patient was brought to the electrophysiology lab in a fasting state.  The patient was adequately sedated with intravenous medications as outlined in the anesthesia report.  The patient's left and right groins were prepped and draped in the usual sterile fashion by the EP lab staff.  Using a percutaneous Seldinger technique, two 7-French and one 11-French hemostasis sheaths were placed into the right common femoral vein.    3 Dimensional Rotational Angiography: A 5 french pigtail catheter was introduced through the right common femoral vein  and advanced into the inferior venocava.  3 demential rotational angiography was then performed by power injection of 100cc of nonionic contrast.  Reprocessing at an independent work station was then performed.   This demonstrated a large sized left atrium with 4 separate pulmonary veins which were also moderate in size.  There was a short common segment to the LSPV and LIPV.  A 3 dimensional rendering of the left atrium was then merged using [employer] onto the [employer] system and registered with intracardiac echo (see below).  The pigtail catheter was then removed.  Catheter Placement:  A 7-French [employer] Webster Decapolar coronary sinus catheter was introduced through the right common femoral vein and advanced into the coronary sinus for recording and pacing from this location.  A 6-French quadripolar Josephson catheter was introduced through the right common femoral vein and advanced into the right ventricle for recording and pacing.  This catheter was then pulled back to the His bundle location.    Initial Measurements: The patient presented to the electrophysiology lab in atrial fibrillation.  Her average RR interval measured 837 msec.  The HV interval 46 msec.   She had frequent ventricular pacing.  She was observed to have occasional left atrial flutter however this would quickly degenerate into atrial fibrillation and was not suitable for mapping today.  Intracardiac Echocardiography: A 10-French Bios[employer] Webster AcuNav intracardiac echocardiography catheter was introduced through the left common femoral vein and advanced into the right atrium. Intracardiac echocardiography was performed of the left atrium, and a three-dimensional anatomical rendering of the left atrium was performed using CARTO sound technology.  The patient was noted to have a large sized left atrium.  The interatrial septum was very aneurysmal with bowing into the right atrium. All 4 pulmonary veins were  visualized.  There was a short common segment to the LSPV and LIPV.  The pulmonary veins were moderate in size.  The left atrial appendage had previously been oversewn   There was no evidence of pulmonary vein stenosis.  There was a mitral ring visualized today which was quite prominent with reverberation artifact noted.  There was smoke within the left atrium but no thrombus visualized.  Transseptal Puncture: The middle right common femoral vein sheath was exchanged for an 8.5 [place] SL2 transseptal sheath and transseptal access was achieved in a standard fashion using a Brockenbrough needle under biplane fluoroscopy with intracardiac echocardiography confirmation of the transseptal puncture.  Once transseptal access had been achieved, heparin was administered intravenously and intra- arterially in order to maintain an ACT of greater than 400 seconds throughout the procedure.   3D Mapping and Ablation: The His bundle catheter was removed and in its place a 3.5 mm [employer] Thermocool ablation catheter was advanced into the right atrium.  The transseptal sheath was pulled back into the IVC over a guidewire.  The ablation catheter was advanced across the transseptal hole using the wire as a guide.  The transseptal sheath was then re-advanced over the guidewire into the left atrium.  A duodecapolar Biosense Webster circular mapping catheter was introduced through the transseptal sheath and positioned over the mouth of all 4 pulmonary veins.  Three-dimensional electroanatomical mapping was performed using CARTO technology.  This demonstrated electrical activity within all four pulmonary veins at baseline. The patient underwent successful sequential electrical isolation and anatomical encircling of all four pulmonary veins using radiofrequency current with a circular mapping catheter as a guide.  Complex fractionated electrograms (CFAEs) were then identified and ablated along the roof of the  left atrium, interatrial septum, along the lateral wall of the left atrium, and above the coronary sinus.    Cardioversion: The patient was then cardioverted to sinus rhythm with a three separate synchronized 360-J biphasic shock with cardioversion electrodes in the anterior-posterior thoracic configuration.  Which each shock, she would transiently convert to sinus rhythm before quickly returning to atrial fibrillation.  Additional CAFEs were therefore ablated within the left atrium between the RIPV and the coronary sinus.   Ibutilide 0.5mg  was then infused over 5 minutes.  Her QT interval extended from 440 to 520 msec and therefore infusion was discontinued.  Her QT gradually improved.  She was then successfully cardioverted with a single synchronized 360J delivered.  She remained in sinus rhythm thereafter.  Measurements Following Ablation She was primarily AV paced after the procedure.  The QRS was 104 msec with a Qtc of 591msec.   Ventricular pacing was performed, which revealed VA dissociation when pacing at 600 msec. Rapid atrial pacing was performed, which revealed an AV Wenckebach cycle length of 600 msec.    The procedure was therefore considered completed.  All catheters were removed, and the sheaths were aspirated and flushed.  The patient was transferred to the recovery area for sheath removal per protocol.  A limited bedside transthoracic echocardiogram revealed no pericardial effusion.  She was observed to have a tongue laceration with oropharyngeal blood post procedure likely related to anesthesia.  The patients pacemaker was interrogated and this confirmed that atrial and ventricular pacing/ sensing/ impedance measurements were stable when compared to prior to the procedure.  CONCLUSIONS: 1. Atrial fibrillation upon presentation.   2. Rotational Angiography reveals a large sized left atrium with four pulmonary veins without evidence of pulmonary vein stenosis. 3.  Successful electrical  isolation and anatomical encircling of all four pulmonary veins with radiofrequency current. 3. CFAEs were identified and ablated along the roof of the left atrium,  the interatrial septum, along the lateral wall of the left atrium, and above the coronary sinus.  There was a paucity of CAFEs along the posterior wall 4. Atrial fibrillation successfully cardioverted to sinus rhythm after ibutilide infusion   Efren Kross,MD 11:56 AM 11/08/2013

## 2013-11-08 NOTE — H&P (Signed)
PCP: Irven Shelling, MD  Primary Cardiologist: Dr Tamala Julian   The patient presents today for routine electrophysiology study and ablation. Since last being seen in our clinic, the patient reports doing very well. Today, she denies symptoms of palpitations, chest pain, shortness of breath, orthopnea, PND, lower extremity edema, dizziness, presyncope, syncope, or neurologic sequela. The patient feels that she is tolerating medications without difficulties and is otherwise without complaint today.   Past Medical History   Diagnosis  Date   .  Hx of atrial flutter  july 2009     atypical atrial flutter   .  H/O mitral valve repair  [facility] , [place].   .  H/O tricuspid valve repair    .  Hx of acquired endocarditis  1970; 1986     Prior to mitral valve repair   .  Hyperlipidemia      cardiac cath clean coronary arties in the past.   .  Glaucoma    .  Dry eyes    .  Blepharitis    .  Macular degeneration      early , Hecker   .  Goiter    .  Hypothyroid    .  Atrial fibrillation      persistent   .  Symptomatic bradycardia      s/p PPM   .  Chronic diastolic CHF (congestive heart failure)    .  Pneumonia  ~ 2003   .  Migraine headache      "used to have severe; not as much since hysterectomy"   .  Lumbosacral disc disease    .  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  Arthritis      "knees"   .  Anxiety    .  Depression    .  Depressive disorder, not elsewhere classified    .  Thyroid nodule    .  Long term (current) use of anticoagulants      No bleeding   .  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  Tachy-brady syndrome    .  Cardiac pacemaker in situ  2013    Past Surgical History   Procedure  Laterality  Date   .  Mitral valve repair   05/2004     [facility]   .  Cardioversion   ~ 2011; 09/18/2011     ?; Procedure: CARDIOVERSION; Surgeon: Sinclair Grooms, MD; Location: [place]; Service: Cardiovascular;  Laterality: N/A;   .  Pacemaker insertion   01/13/12     MDT Adapta L implanted by Dr Rayann Heman   .  Tonsillectomy and adenoidectomy   1948   .  Abdominal hysterectomy   1986   .  Knee cartilage surgery   02/2009     left; Dr. Berenice Primas   .  Lumbar laminectomy   1997   .  Tricuspid valve surgery   05/2004     "repair; [facility]"   .  Back surgery     .  Cataract extraction w/ intraocular lens implant, bilateral   ~ 2011   .  Cardiac catheterization       "probably 3 so far" (01/13/12)   .  Cardioversion  N/A  04/29/2013     Procedure: CARDIOVERSION; Surgeon: Sinclair Grooms, MD; Location: [place]; Service: Cardiovascular; Laterality: N/A;    Current Outpatient  Prescriptions   Medication  Sig  Dispense  Refill   .  ALPRAZolam (XANAX) 0.5 MG tablet  Take 0.5 mg by mouth 3 (three) times daily as needed. For anxiety     .  Calcium Carbonate-Vitamin D (CALCIUM 600 + D PO)  Take 1 tablet by mouth daily.     .  cycloSPORINE (RESTASIS) 0.05 % ophthalmic emulsion  Place 1 drop into both eyes 2 (two) times daily.     .  dorzolamide-timolol (COSOPT) 22.3-6.8 MG/ML ophthalmic solution  Place 1 drop into both eyes 2 (two) times daily.     .  furosemide (LASIX) 40 MG tablet  Take 1.5 tablets (60 mg total) by mouth daily.  135 tablet  0   .  hydrocortisone cream 1 %  Apply 1 application topically 3 (three) times daily as needed (skin irritation).  30 g  0   .  latanoprost (XALATAN) 0.005 % ophthalmic solution  Place 1 drop into both eyes daily.     Marland Kitchen  levothyroxine (SYNTHROID, LEVOTHROID) 88 MCG tablet  Take 88 mcg by mouth daily.     .  metoprolol succinate (TOPROL XL) 25 MG 24 hr tablet  Take 2 tablets (50 mg total) by mouth daily.  180 tablet  0   .  Multiple Vitamins-Minerals (ICAPS) CAPS  Take 1 capsule by mouth 2 (two) times daily.     .  Omega-3 Fatty Acids (FISH OIL) 1200 MG CAPS  Take 1,000 mg by mouth 4 (four) times daily.     Marland Kitchen  PARoxetine (PAXIL-CR) 37.5 MG 24 hr tablet  Take 37.5 mg by  mouth as needed.     .  potassium chloride SA (K-DUR,KLOR-CON) 20 MEQ tablet  Take 20 mEq by mouth daily.     .  rosuvastatin (CRESTOR) 40 MG tablet  Take 40 mg by mouth daily.     .  traMADol (ULTRAM) 50 MG tablet  Take 50 mg by mouth every 6 (six) hours as needed. For pain     .  warfarin (COUMADIN) 5 MG tablet  Take 1/2 tablet (2.5 mg total) by mouth once daily  30 tablet  1   .  ZIOPTAN 0.0015 % SOLN  Place 1 drop into both eyes daily.     Marland Kitchen  zolmitriptan (ZOMIG) 5 MG tablet  Take 5 mg by mouth as needed. For migraines     .  zolpidem (AMBIEN) 10 MG tablet  Take 10 mg by mouth at bedtime as needed. For sleep      No current facility-administered medications for this visit.    Allergies   Allergen  Reactions   .  Trazodone And Nefazodone  Other (See Comments)     Weakness.   .  Citalopram Hydrobromide  Other (See Comments)     Headache and insomnia    History    Social History   .  Marital Status:  Married     Spouse Name:  N/A     Number of Children:  N/A   .  Years of Education:  N/A    Occupational History   .  Not on file.    Social History Main Topics   .  Smoking status:  Former Smoker -- 1.00 packs/day for 24 years     Types:  Cigarettes     Quit date:  07/14/1985   .  Smokeless tobacco:  Never Used   .  Alcohol Use:  4.0 oz/week  8 drink(s) per week      Comment: wine 4 glasses/wk   .  Drug Use:  No   .  Sexual Activity:  Yes    Other Topics  Concern   .  Not on file    Social History Narrative    Lives in [place] with spouse. No children. Retired Education officer, museum.    Family History   Problem  Relation  Age of Onset   .  Stroke  Father    .  Heart attack  Father  81   .  Macular degeneration  Mother    .  Transient ischemic attack  Mother      multiple   .  Hypertension  Brother    ROS- All systems are reviewed and are negative except as outlined in the HPI above  Filed Vitals:   11/08/13 0541  BP: 142/92  Pulse: 84  Temp: 97.8 F (36.6 C)   Resp: 20   Physical Exam:   GEN- The patient is well appearing, alert and oriented x 3 today.  Head- normocephalic, atraumatic  Eyes- Sclera clear, conjunctiva pink  Ears- hearing intact  Oropharynx- clear  Neck- supple, no JVP  Lymph- no cervical lymphadenopathy  Lungs- Clear to ausculation bilaterally, normal work of breathing  Heart- irregular rate and rhythm, no murmurs, rubs or gallops, PMI not laterally displaced  GI- soft, NT, ND, + BS  Extremities- no clubbing, cyanosis, or edema  MS- no significant deformity or atrophy  Skin- no rash or lesion  Psych- euthymic mood, full affect    Assessment and Plan:  1. pesistent afib/ atypical atrial flutter  The patient has persistent afib and atypical atrial flutter. She has failed medical therapy with amiodarone. She is doing reasonably well with rate control but does have fatigue at times. She is appropriately anticoagulated. Therapeutic strategies for afib and atypical atrial flutter including medicine and ablation were discussed in detail with the patient today. Risk, benefits, and alternatives to EP study and radiofrequency ablation for afib were also discussed in detail today. She is aware that anticipated success rates for ablation are probably 50-60% with a high likelihood of requiring multiple procedures. Her valvular heart disease and enlarged LA suggest reduced success rates. These risks include but are not limited to stroke, bleeding, vascular damage, tamponade, perforation, damage to the esophagus, lungs, and other structures, pulmonary vein stenosis, worsening renal function, pacemaker lead dislodgement and death. The patient understands these risk and wishes to proceed. Her INR is 1.7 today.  TEE is reviewed and reveals no thrombus.   Will give additional coumadin this am and proceed.

## 2013-11-08 NOTE — Anesthesia Procedure Notes (Signed)
Procedure Name: LMA Insertion Date/Time: 11/08/2013 7:54 AM Performed by: Wanita Chamberlain Pre-anesthesia Checklist: Patient identified, Timeout performed, Emergency Drugs available, Suction available and Patient being monitored Patient Re-evaluated:Patient Re-evaluated prior to inductionOxygen Delivery Method: Circle system utilized Preoxygenation: Pre-oxygenation with 100% oxygen Intubation Type: IV induction LMA: LMA with gastric port inserted LMA Size: 4.0 Number of attempts: 1 Placement Confirmation: positive ETCO2 Tube secured with: Tape Dental Injury: Teeth and Oropharynx as per pre-operative assessment

## 2013-11-08 NOTE — Transfer of Care (Signed)
Immediate Anesthesia Transfer of Care Note  Patient: Jo Hughes  Procedure(s) Performed: Procedure(s): ATRIAL FIBRILLATION ABLATION (N/A)  Patient Location: PACU  Anesthesia Type:General  Level of Consciousness: awake, alert , oriented and patient cooperative  Airway & Oxygen Therapy: Patient Spontanous Breathing and Patient connected to nasal cannula oxygen  Post-op Assessment: Report given to PACU RN and Post -op Vital signs reviewed and stable  Post vital signs: Reviewed and stable  Complications: No apparent anesthesia complications

## 2013-11-08 NOTE — Anesthesia Preprocedure Evaluation (Addendum)
Anesthesia Evaluation  Patient identified by MRN, date of birth, ID band Patient awake    Reviewed: Allergy & Precautions, H&P , NPO status , Patient's Chart, lab work & pertinent test results, reviewed documented beta blocker date and time   Airway Mallampati: II TM Distance: >3 FB Neck ROM: full    Dental  (+) Teeth Intact, Dental Advisory Given   Pulmonary pneumonia -, resolved, former smoker,  breath sounds clear to auscultation  Pulmonary exam normal       Cardiovascular +CHF + dysrhythmias Atrial Fibrillation + pacemaker + Valvular Problems/Murmurs MR Rhythm:Irregular Rate:Normal  2 D ECHO Study Conclusions  - Left ventricle: There was mild concentric hypertrophy.   Systolic function was mildly reduced. The estimated   ejection fraction was in the range of 45% to 50%. Diffuse   hypokinesis. - Aortic valve: Structurally normal valve. Trileaflet. No   evidence of vegetation. No regurgitation. - Aorta: The aorta was poorly visualized. - Mitral valve: Cosgrove 28 mm annuloplasty ring in place.   Tethering of the posterior mitral leaflet. 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 There is mild, posteriorly directed   regurgitation. - Left atrium: Heavy smoke. The LAA has been surgically   excised. - Right ventricle: Pacer wire or catheter noted in right   ventricle. - Right atrium: The atrium was normal in size. Pacer wire or   catheter noted in right atrium. No evidence of thrombus in   the atrial cavity or appendage. 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   Negative bubble study for PFO. - Tricuspid valve: Moderate regurgitation. Peak RV-RA   gradient: 24mm Hg (S). - Line: A venous pacing wire was visualized in the right   atrial cavity and right ventricular cavity, with its tip   at the RV apex. - Pericardium, extracardiac: There was no pericardial   effusion.   Neuro/Psych  Headaches, PSYCHIATRIC  DISORDERS    GI/Hepatic negative GI ROS, Neg liver ROS,   Endo/Other  Hypothyroidism   Renal/GU negative Renal ROS  negative genitourinary   Musculoskeletal   Abdominal   Peds  Hematology negative hematology ROS (+)   Anesthesia Other Findings See surgeon's H&P   Reproductive/Obstetrics negative OB ROS                        Anesthesia Physical Anesthesia Plan  ASA: III  Anesthesia Plan: General and MAC   Post-op Pain Management:    Induction: Intravenous  Airway Management Planned: LMA and Mask  Additional Equipment:   Intra-op Plan:   Post-operative Plan:   Informed Consent: I have reviewed the patients History and Physical, chart, labs and discussed the procedure including the risks, benefits and alternatives for the proposed anesthesia with the patient or authorized representative who has indicated his/her understanding and acceptance.   Dental Advisory Given  Plan Discussed with: CRNA and Surgeon  Anesthesia Plan Comments:         Anesthesia Quick Evaluation

## 2013-11-09 DIAGNOSIS — I4892 Unspecified atrial flutter: Secondary | ICD-10-CM

## 2013-11-09 DIAGNOSIS — I4891 Unspecified atrial fibrillation: Secondary | ICD-10-CM

## 2013-11-09 DIAGNOSIS — Z950 Presence of cardiac pacemaker: Secondary | ICD-10-CM

## 2013-11-09 DIAGNOSIS — I498 Other specified cardiac arrhythmias: Secondary | ICD-10-CM

## 2013-11-09 LAB — CBC
HEMATOCRIT: 38.6 % (ref 36.0–46.0)
HEMOGLOBIN: 12.3 g/dL (ref 12.0–15.0)
MCH: 28.9 pg (ref 26.0–34.0)
MCHC: 31.9 g/dL (ref 30.0–36.0)
MCV: 90.6 fL (ref 78.0–100.0)
Platelets: 151 10*3/uL (ref 150–400)
RBC: 4.26 MIL/uL (ref 3.87–5.11)
RDW: 14.1 % (ref 11.5–15.5)
WBC: 10 10*3/uL (ref 4.0–10.5)

## 2013-11-09 LAB — BASIC METABOLIC PANEL
BUN: 15 mg/dL (ref 6–23)
CO2: 24 meq/L (ref 19–32)
Calcium: 9.3 mg/dL (ref 8.4–10.5)
Chloride: 108 mEq/L (ref 96–112)
Creatinine, Ser: 0.89 mg/dL (ref 0.50–1.10)
GFR calc non Af Amer: 64 mL/min — ABNORMAL LOW (ref >90)
GFR, EST AFRICAN AMERICAN: 74 mL/min — AB (ref >90)
Glucose, Bld: 121 mg/dL — ABNORMAL HIGH (ref 70–99)
POTASSIUM: 3.8 meq/L (ref 3.7–5.3)
Sodium: 144 mEq/L (ref 137–147)

## 2013-11-09 LAB — PROTIME-INR
INR: 2.72 — ABNORMAL HIGH (ref 0.00–1.49)
Prothrombin Time: 27.9 seconds — ABNORMAL HIGH (ref 11.6–15.2)

## 2013-11-09 MED ORDER — WARFARIN SODIUM 5 MG PO TABS: 2.5000 mg | ORAL_TABLET | Freq: Every day | ORAL | Status: DC

## 2013-11-09 MED ORDER — PANTOPRAZOLE SODIUM 40 MG PO TBEC: 40.0000 mg | DELAYED_RELEASE_TABLET | Freq: Every day | ORAL | Status: DC

## 2013-11-09 MED ORDER — HYDROCORTISONE 1 % EX OINT: 1.0000 "application " | TOPICAL_OINTMENT | Freq: Two times a day (BID) | CUTANEOUS | Status: DC

## 2013-11-09 NOTE — Discharge Instructions (Signed)
No driving for 3 days. No lifting over 5 lbs for 1 week. No sexual activity for 1 week. Keep procedure site clean & dry. If you notice increased pain, swelling, bleeding or pus, call/return! You may shower, but no soaking baths/hot tubs/pools for 1 week. ° °

## 2013-11-11 ENCOUNTER — Ambulatory Visit (INDEPENDENT_AMBULATORY_CARE_PROVIDER_SITE_OTHER): Payer: Medicare Other | Admitting: Pharmacist

## 2013-11-11 DIAGNOSIS — I4891 Unspecified atrial fibrillation: Secondary | ICD-10-CM

## 2013-11-11 LAB — POCT INR: INR: 2.4

## 2013-11-18 ENCOUNTER — Ambulatory Visit (INDEPENDENT_AMBULATORY_CARE_PROVIDER_SITE_OTHER): Payer: Medicare Other

## 2013-11-18 DIAGNOSIS — I4891 Unspecified atrial fibrillation: Secondary | ICD-10-CM

## 2013-11-18 LAB — POCT INR: INR: 2.7

## 2013-11-30 ENCOUNTER — Telehealth: Payer: Self-pay | Admitting: Pharmacist

## 2013-11-30 ENCOUNTER — Encounter: Payer: Self-pay | Admitting: Internal Medicine

## 2013-11-30 ENCOUNTER — Ambulatory Visit (INDEPENDENT_AMBULATORY_CARE_PROVIDER_SITE_OTHER): Payer: Medicare Other | Admitting: Surgery

## 2013-11-30 ENCOUNTER — Ambulatory Visit (INDEPENDENT_AMBULATORY_CARE_PROVIDER_SITE_OTHER): Payer: Medicare Other | Admitting: Internal Medicine

## 2013-11-30 VITALS — BP 104/78 | HR 72 | Ht 64.0 in | Wt 155.0 lb

## 2013-11-30 DIAGNOSIS — I4891 Unspecified atrial fibrillation: Secondary | ICD-10-CM

## 2013-11-30 DIAGNOSIS — Z950 Presence of cardiac pacemaker: Secondary | ICD-10-CM

## 2013-11-30 DIAGNOSIS — R001 Bradycardia, unspecified: Secondary | ICD-10-CM

## 2013-11-30 DIAGNOSIS — I498 Other specified cardiac arrhythmias: Secondary | ICD-10-CM

## 2013-11-30 LAB — MDC_IDC_ENUM_SESS_TYPE_INCLINIC
Battery Remaining Longevity: 124 mo
Battery Voltage: 2.78 V
Brady Statistic AP VP Percent: 0 %
Brady Statistic AS VP Percent: 0 %
Brady Statistic AS VS Percent: 100 %
Date Time Interrogation Session: 20150520162405
Lead Channel Impedance Value: 634 Ohm
Lead Channel Pacing Threshold Amplitude: 0.75 V
Lead Channel Sensing Intrinsic Amplitude: 11.2 mV
Lead Channel Setting Pacing Amplitude: 2 V
Lead Channel Setting Pacing Amplitude: 2.5 V
Lead Channel Setting Sensing Sensitivity: 5.6 mV
MDC IDC MSMT BATTERY IMPEDANCE: 160 Ohm
MDC IDC MSMT LEADCHNL RA IMPEDANCE VALUE: 453 Ohm
MDC IDC MSMT LEADCHNL RA SENSING INTR AMPL: 2 mV
MDC IDC MSMT LEADCHNL RV PACING THRESHOLD PULSEWIDTH: 0.76 ms
MDC IDC SET LEADCHNL RV PACING PULSEWIDTH: 0.76 ms
MDC IDC STAT BRADY AP VS PERCENT: 0 %

## 2013-11-30 LAB — POCT INR: INR: 2.7

## 2013-11-30 NOTE — Telephone Encounter (Signed)
Dr. Rayann Heman would like patient to have Tikosyn load next week.  Her INR was 2.7 today, and has been 2.0 or higher for past 3 weeks.  Spoke with patient, and she would like to do this on Tuesday morning if possible.  Patient scheduled to see me on 12/06/13 and will get blood work and EKG at that time.

## 2013-11-30 NOTE — Patient Instructions (Signed)
Your physician recommends that you schedule a follow-up appointment in: 6 weeks with Dr Rayann Heman  Rosana Hoes

## 2013-12-04 ENCOUNTER — Other Ambulatory Visit: Payer: Self-pay | Admitting: Interventional Cardiology

## 2013-12-06 ENCOUNTER — Ambulatory Visit (INDEPENDENT_AMBULATORY_CARE_PROVIDER_SITE_OTHER): Payer: Medicare Other | Admitting: Pharmacist

## 2013-12-06 VITALS — BP 110/76 | HR 89 | Ht 64.0 in | Wt 155.0 lb

## 2013-12-06 DIAGNOSIS — I4891 Unspecified atrial fibrillation: Secondary | ICD-10-CM

## 2013-12-06 LAB — MAGNESIUM: MAGNESIUM: 2.3 mg/dL (ref 1.5–2.5)

## 2013-12-06 LAB — BASIC METABOLIC PANEL
BUN: 19 mg/dL (ref 6–23)
CO2: 28 mEq/L (ref 19–32)
CREATININE: 0.89 mg/dL (ref 0.50–1.10)
Calcium: 10.8 mg/dL — ABNORMAL HIGH (ref 8.4–10.5)
Chloride: 102 mEq/L (ref 96–112)
Glucose, Bld: 99 mg/dL (ref 70–99)
POTASSIUM: 3.6 meq/L (ref 3.5–5.3)
Sodium: 141 mEq/L (ref 135–145)

## 2013-12-06 LAB — PROTIME-INR
INR: 2.08 — ABNORMAL HIGH (ref <1.50)
PROTHROMBIN TIME: 22.7 s — AB (ref 11.6–15.2)

## 2013-12-06 NOTE — Assessment & Plan Note (Addendum)
Patient's labs reviewed.  K- 3.6 is low.  Magnesium 2.3.  INR was 2.08 today (was 2.7 last week).  Patient takes KCl 20 mEq qd in the PM currently. Patient advised to take 20 mEq now, another 20 mEq in 1 hour, then take her usual dosage tonight. She will take a 20 mEq dose in the morning, and come in for repeat BMET and Magnesium tomorrow.  Patient advised to take an extra 5 mg of warfarin now, then take her usual 2.5 mg tonight.  If potassium, INR, and magnesium are acceptable, will proceed with Tikosyn initiation at that time.  Corrected QTc 439 today (5/26).  Update (12/07/13):  K- up to 4.0 today.  Magnesium 2.1. Scr 0.92, CrCl ~ 63 ml/min.  Acceptable now for Tikosyn initiation.  Corrected QTc 439 (12/06/13).  Patient will likely need her previous home dose of warfarin 2.5 mg qd except 5 mg Monday,Wedneday, Friday.  May need a low dose today since INR trending up over past 24 hours.  Possibly only 2 mg warfarin today. Spoke with [facility], and they will call patient once a room is available. I called patient to make her aware, and she will go to [facility] once they call her.

## 2013-12-06 NOTE — Progress Notes (Signed)
PCP:  Irven Shelling, MD Primary Cardiologist:  Dr Tamala Julian  The patient presents today for routine electrophysiology followup.  Since her recent ablation 4/15, the patient reports doing very well.  She denies procedure related complications.  She has however had more afib.  She is in persistent afib.  She reports fatigue.  Today, she denies symptoms of chest pain, shortness of breath, orthopnea, PND, lower extremity edema, dizziness, presyncope, syncope, or neurologic sequela.  The patient feels that she is tolerating medications without difficulties and is otherwise without complaint today.   Past Medical History  Diagnosis Date  . Hx of atrial flutter july 2009    atypical atrial flutter  . H/O mitral valve repair [facility] , [place].  . H/O tricuspid valve repair   . Hx of acquired endocarditis 1970; 1986    Prior to mitral valve repair   . Hyperlipidemia     cardiac cath clean coronary arties in the past.  . Glaucoma   . Blepharitis   . Macular degeneration     early , Hecker  . Hypothyroid   . Atrial fibrillation     persistent  . Symptomatic bradycardia     s/p PPM  . Chronic diastolic CHF (congestive heart failure)   . Pneumonia ~ 2003  . Migraine headache     "used to have severe; not as much since hysterectomy"  . 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 Arthritis     "knees"  . Anxiety   . Depression   . Thyroid nodule   . Long term (current) use of anticoagulants     No bleeding   Past Surgical History  Procedure Laterality Date  . Mitral valve repair  05/2004    [facility]  . Cardioversion  ~ 2011; 09/18/2011    ?; Procedure: CARDIOVERSION;  Surgeon: Sinclair Grooms, MD;  Location: [place];  Service: Cardiovascular;  Laterality: N/A;  . Pacemaker insertion  01/13/12    MDT Adapta L implanted by Dr Rayann Heman  . Tonsillectomy and adenoidectomy  1948  . Abdominal hysterectomy  1986  . Knee cartilage surgery   02/2009    left; Dr. Berenice Primas  . Lumbar laminectomy  1997  . Tricuspid valve surgery  05/2004    "repair; [facility]"  . Back surgery    . Cataract extraction w/ intraocular lens  implant, bilateral  ~ 2011  . Cardiac catheterization      "probably 3 so far" (01/13/12)  . Cardioversion N/A 04/29/2013    Procedure: CARDIOVERSION;  Surgeon: Sinclair Grooms, MD;  Location: [facility] ENDOSCOPY;  Service: Cardiovascular;  Laterality: N/A;  . Tee without cardioversion N/A 11/07/2013    Procedure: TRANSESOPHAGEAL ECHOCARDIOGRAM (TEE);  Surgeon: Pixie Casino, MD;  Location: [facility] ENDOSCOPY;  Service: Cardiovascular;  Laterality: N/A;  . Ablation  [id number]    PVI by Dr Rayann Heman     Current Outpatient Prescriptions  Medication Sig Dispense Refill  . ALPRAZolam (XANAX) 0.5 MG tablet Take 0.5 mg by mouth 4 (four) times daily. For anxiety      . Calcium Carbonate-Vitamin D (CALCIUM 600 + D PO) Take 1 tablet by mouth daily.       . cycloSPORINE (RESTASIS) 0.05 % ophthalmic emulsion Place 1 drop into both eyes 2 (two) times daily.       . dorzolamide-timolol (COSOPT) 22.3-6.8 MG/ML ophthalmic solution Place 1 drop into both eyes 2 (  two) times daily.      . furosemide (LASIX) 40 MG tablet Take 1.5 tablets (60 mg total) by mouth daily.  135 tablet  0  . hydrocortisone 1 % ointment Apply 1 application topically 2 (two) times daily. To irritated areas on chest after cardioversion.  30 g  0  . latanoprost (XALATAN) 0.005 % ophthalmic solution Place 1 drop into both eyes daily.       Marland Kitchen levothyroxine (SYNTHROID, LEVOTHROID) 88 MCG tablet Take 88 mcg by mouth daily.      . metoprolol succinate (TOPROL-XL) 25 MG 24 hr tablet TAKE 2 TABLETS DAILY.  180 tablet  1  . Multiple Vitamins-Minerals (ICAPS) CAPS Take 1 capsule by mouth 2 (two) times daily.       . Omega-3 Fatty Acids (FISH OIL) 1000 MG CAPS Take 1,000 mg by mouth 4 (four) times daily - after meals and at bedtime.      Marland Kitchen PARoxetine (PAXIL-CR) 37.5 MG 24 hr  tablet Take 37.5 mg by mouth daily.       . potassium chloride SA (K-DUR,KLOR-CON) 20 MEQ tablet Take 20 mEq by mouth daily.      . rosuvastatin (CRESTOR) 40 MG tablet Take 40 mg by mouth daily.      . traMADol (ULTRAM) 50 MG tablet Take 50 mg by mouth every 6 (six) hours as needed. For pain      . warfarin (COUMADIN) 5 MG tablet Take 0.5 tablets (2.5 mg total) by mouth daily.      Marland Kitchen ZIOPTAN 0.0015 % SOLN Place 1 drop into both eyes daily.       Marland Kitchen zolmitriptan (ZOMIG) 5 MG tablet Take 5 mg by mouth as needed. For migraines      . zolpidem (AMBIEN) 10 MG tablet Take 10 mg by mouth at bedtime as needed. For sleep       No current facility-administered medications for this visit.    Allergies  Allergen Reactions  . Trazodone And Nefazodone Other (See Comments)    Weakness.  . Citalopram Hydrobromide Other (See Comments)    Headache and insomnia    History   Social History  . Marital Status: Married    Spouse Name: N/A    Number of Children: N/A  . Years of Education: N/A   Occupational History  . Not on file.   Social History Main Topics  . Smoking status: Former Smoker -- 1.00 packs/day for 24 years    Types: Cigarettes    Quit date: 07/14/1985  . Smokeless tobacco: Never Used  . Alcohol Use: 4.0 oz/week    8 drink(s) per week     Comment: wine 4 glasses/wk  . Drug Use: No  . Sexual Activity: Yes   Other Topics Concern  . Not on file   Social History Narrative   Lives in [place] with spouse.  No children.  Retired Education officer, museum.    Family History  Problem Relation Age of Onset  . Stroke Father   . Heart attack Father 54  . Macular degeneration Mother   . Transient ischemic attack Mother     multiple  . Hypertension Brother     ROS-  All systems are reviewed and are negative except as outlined in the HPI above  Physical Exam: Filed Vitals:   11/30/13 1556  BP: 104/78  Pulse: 72  Height: 5\' 4"  (1.626 m)  Weight: 155 lb (70.308 kg)    GEN- The  patient is well appearing, alert and  oriented x 3 today.   Head- normocephalic, atraumatic Eyes-  Sclera clear, conjunctiva pink Ears- hearing intact Oropharynx- clear Neck- supple, no JVP Lymph- no cervical lymphadenopathy Lungs- Clear to ausculation bilaterally, normal work of breathing Heart- irregular rate and rhythm, no murmurs, rubs or gallops, PMI not laterally displaced GI- soft, NT, ND, + BS Extremities- no clubbing, cyanosis, or edema MS- no significant deformity or atrophy Skin- no rash or lesion Psych- euthymic mood, full affect Neuro- strength and sensation are   ekg today reveals afib, V rate 77 bpm, Qtc 426  Assessment and Plan:  1. pesistent afib/ atypical atrial flutter Doing well s/p ablation but with ERAF. Therapeutic strategies for afib including tikosyn and amiodarone were discussed in detail with the patient today. Risk, benefits, and alternatives to each medicine were also discussed in detail today.  At this time, she would prefer tikosyn.  We will therefore arrange initiation of tikosyn at the next available time.  2. Tachycardia/ bradycardia syndrome Normal pacemaker function See Pace Art report No changes today  3. Valvular heart disease Echo reviewed Stable No changes today  Admit for tikosyn next week Follow-up with me in 2 months

## 2013-12-06 NOTE — Progress Notes (Signed)
Jo Hughes is a 73 y.o. female who presents today for Tikosyn initiation.  She was seen by Dr. Rayann Heman last week given her persistent afib / atypical atrial flutter.  Patient failed amiodarone medication management in the past, and most recently failed to maintain normal sinus rhythm following an ablation.  Plan to admit for Tikosyn initiation today.  She must be somewhere 12/11/13, so will try to get her started today, or tomorrow at the latest based on labs.  Patient educated on potential side effects of Tikosyn, including QTc prolongation.  She is aware of the importance of compliance and will call the office if she misses more than 2 doses in a row.  Cost of her brand name medications has not been an issue for patient historically, so cost will not likely be an obstacle for patient.    Reviewed patients medication list.  She is currently not taking any QTc prolongating medications or contraindicated medications.  She failed amiodarone in the past.  She has been anticoagulated with warfarin, and has had INRs > 2.0 for the past 4 weeks.  INR on 4/29 was 2.72, 5/1 was 2.4, 5/8 was 2.7, and 5/20 was 2.7.  Checking an INR today.   EKG on 12/06/13 reviewed by Dr. Caryl Comes.  Ventricular rate 89 bpm and corrected QTc of 439 by Fredericia calculator.  Patient has pacemaker.  Update as of 12/07/13:  On 12/06/13, due to potassium level of 3.6, patient was given an additional 40 mEq of potassium on 12/06/13, and an extra 20 mEq on 12/07/13 AM.  Potassium level on BMET on 12/07/13 was up to 4.0.  INR was 3.4 today (12/07/13) as gave an extra 5 mg of warfarin last night as INR was trending downward quickly.  Current Outpatient Prescriptions  Medication Sig Dispense Refill  . ALPRAZolam (XANAX) 0.5 MG tablet Take 0.5 mg by mouth 4 (four) times daily. For anxiety      . Calcium Carbonate-Vitamin D (CALCIUM 600 + D PO) Take 1 tablet by mouth daily.       . cycloSPORINE (RESTASIS) 0.05 % ophthalmic emulsion Place 1 drop into  both eyes 2 (two) times daily.       . dorzolamide-timolol (COSOPT) 22.3-6.8 MG/ML ophthalmic solution Place 1 drop into both eyes 2 (two) times daily.      . furosemide (LASIX) 40 MG tablet Take 1.5 tablets (60 mg total) by mouth daily.  135 tablet  0  . hydrocortisone 1 % ointment Apply 1 application topically 2 (two) times daily. To irritated areas on chest after cardioversion.  30 g  0  . latanoprost (XALATAN) 0.005 % ophthalmic solution Place 1 drop into both eyes daily.       Marland Kitchen levothyroxine (SYNTHROID, LEVOTHROID) 88 MCG tablet Take 88 mcg by mouth daily.      . metoprolol succinate (TOPROL-XL) 25 MG 24 hr tablet TAKE 2 TABLETS DAILY.  180 tablet  1  . Multiple Vitamins-Minerals (ICAPS) CAPS Take 1 capsule by mouth 2 (two) times daily.       . Omega-3 Fatty Acids (FISH OIL) 1000 MG CAPS Take 1,000 mg by mouth 4 (four) times daily - after meals and at bedtime.      Marland Kitchen PARoxetine (PAXIL-CR) 37.5 MG 24 hr tablet Take 37.5 mg by mouth daily.       . potassium chloride SA (K-DUR,KLOR-CON) 20 MEQ tablet Take 20 mEq by mouth daily.      . rosuvastatin (CRESTOR) 40 MG tablet Take 40 mg  by mouth daily.      . traMADol (ULTRAM) 50 MG tablet Take 50 mg by mouth every 6 (six) hours as needed. For pain      . warfarin (COUMADIN) 5 MG tablet Take 0.5 tablets (2.5 mg total) by mouth daily.      Marland Kitchen ZIOPTAN 0.0015 % SOLN Place 1 drop into both eyes daily.       Marland Kitchen zolmitriptan (ZOMIG) 5 MG tablet Take 5 mg by mouth as needed. For migraines      . zolpidem (AMBIEN) 10 MG tablet Take 10 mg by mouth at bedtime as needed. For sleep       No current facility-administered medications for this visit.   Allergies  Allergen Reactions  . Trazodone And Nefazodone Other (See Comments)    Weakness.  . Citalopram Hydrobromide Other (See Comments)    Headache and insomnia

## 2013-12-07 ENCOUNTER — Encounter (HOSPITAL_COMMUNITY): Payer: Self-pay | Admitting: General Practice

## 2013-12-07 ENCOUNTER — Ambulatory Visit (INDEPENDENT_AMBULATORY_CARE_PROVIDER_SITE_OTHER): Payer: Medicare Other | Admitting: Pharmacist

## 2013-12-07 ENCOUNTER — Inpatient Hospital Stay (HOSPITAL_COMMUNITY)
Admission: AD | Admit: 2013-12-07 | Discharge: 2013-12-10 | DRG: 309 | Disposition: A | Discharge status: Home / Self Care | Payer: Medicare Other | Source: Ambulatory Visit | Attending: Internal Medicine | Admitting: Internal Medicine

## 2013-12-07 ENCOUNTER — Other Ambulatory Visit: Payer: Medicare Other

## 2013-12-07 ENCOUNTER — Telehealth: Payer: Self-pay | Admitting: Internal Medicine

## 2013-12-07 DIAGNOSIS — R5383 Other fatigue: Secondary | ICD-10-CM

## 2013-12-07 DIAGNOSIS — Z9889 Other specified postprocedural states: Secondary | ICD-10-CM

## 2013-12-07 DIAGNOSIS — Z7901 Long term (current) use of anticoagulants: Secondary | ICD-10-CM

## 2013-12-07 DIAGNOSIS — I4891 Unspecified atrial fibrillation: Principal | ICD-10-CM

## 2013-12-07 DIAGNOSIS — R5381 Other malaise: Secondary | ICD-10-CM | POA: Diagnosis present

## 2013-12-07 DIAGNOSIS — R001 Bradycardia, unspecified: Secondary | ICD-10-CM

## 2013-12-07 DIAGNOSIS — E876 Hypokalemia: Secondary | ICD-10-CM | POA: Diagnosis present

## 2013-12-07 DIAGNOSIS — E039 Hypothyroidism, unspecified: Secondary | ICD-10-CM | POA: Diagnosis present

## 2013-12-07 DIAGNOSIS — Z950 Presence of cardiac pacemaker: Secondary | ICD-10-CM | POA: Diagnosis present

## 2013-12-07 DIAGNOSIS — I5032 Chronic diastolic (congestive) heart failure: Secondary | ICD-10-CM | POA: Diagnosis present

## 2013-12-07 DIAGNOSIS — E785 Hyperlipidemia, unspecified: Secondary | ICD-10-CM | POA: Diagnosis present

## 2013-12-07 DIAGNOSIS — I495 Sick sinus syndrome: Secondary | ICD-10-CM | POA: Diagnosis present

## 2013-12-07 DIAGNOSIS — I509 Heart failure, unspecified: Secondary | ICD-10-CM | POA: Diagnosis present

## 2013-12-07 DIAGNOSIS — H3530 Unspecified macular degeneration: Secondary | ICD-10-CM | POA: Diagnosis present

## 2013-12-07 DIAGNOSIS — I4892 Unspecified atrial flutter: Secondary | ICD-10-CM | POA: Diagnosis present

## 2013-12-07 DIAGNOSIS — Z87891 Personal history of nicotine dependence: Secondary | ICD-10-CM

## 2013-12-07 DIAGNOSIS — I484 Atypical atrial flutter: Secondary | ICD-10-CM

## 2013-12-07 HISTORY — DX: Calculus of kidney: N20.0

## 2013-12-07 LAB — BASIC METABOLIC PANEL
BUN: 20 mg/dL (ref 6–23)
CALCIUM: 10.5 mg/dL (ref 8.4–10.5)
CO2: 27 meq/L (ref 19–32)
Chloride: 101 mEq/L (ref 96–112)
Creat: 0.92 mg/dL (ref 0.50–1.10)
GLUCOSE: 131 mg/dL — AB (ref 70–99)
Potassium: 4 mEq/L (ref 3.5–5.3)
SODIUM: 140 meq/L (ref 135–145)

## 2013-12-07 LAB — MAGNESIUM: Magnesium: 2.1 mg/dL (ref 1.5–2.5)

## 2013-12-07 LAB — POCT INR: INR: 3.4

## 2013-12-07 MED ORDER — SODIUM CHLORIDE 0.9 % IV SOLN: 250.0000 mL | INTRAVENOUS | Status: DC | PRN

## 2013-12-07 MED ORDER — WARFARIN - PHARMACIST DOSING INPATIENT: Freq: Every day | Status: DC

## 2013-12-07 MED ORDER — WARFARIN SODIUM 3 MG PO TABS
3.0000 mg | ORAL_TABLET | Freq: Once | ORAL | Status: AC
  Filled 2013-12-07: qty 1

## 2013-12-07 MED ORDER — WARFARIN SODIUM 3 MG PO TABS
3.0000 mg | ORAL_TABLET | Freq: Once | ORAL | Status: AC
  Administered 2013-12-07: 3 mg via ORAL
  Filled 2013-12-07: qty 1

## 2013-12-07 MED ORDER — DOFETILIDE 500 MCG PO CAPS
500.0000 ug | ORAL_CAPSULE | Freq: Two times a day (BID) | ORAL | Status: DC
  Administered 2013-12-07 – 2013-12-10 (×6): 500 ug via ORAL
  Filled 2013-12-07 (×8): qty 1

## 2013-12-07 MED ORDER — SODIUM CHLORIDE 0.9 % IJ SOLN
3.0000 mL | Freq: Two times a day (BID) | INTRAMUSCULAR | Status: DC
  Administered 2013-12-07 – 2013-12-10 (×6): 3 mL via INTRAVENOUS

## 2013-12-07 MED ORDER — SODIUM CHLORIDE 0.9 % IJ SOLN: 3.0000 mL | INTRAMUSCULAR | Status: DC | PRN

## 2013-12-07 MED ORDER — ACETAMINOPHEN 325 MG PO TABS
650.0000 mg | ORAL_TABLET | ORAL | Status: DC | PRN
  Filled 2013-12-07: qty 2

## 2013-12-07 NOTE — Progress Notes (Signed)
12/07/2013 1550 Pt. Arrived on 2w20. Jo Hughes [facility] paged and made aware of arrival and need for orders.  [place]

## 2013-12-07 NOTE — Progress Notes (Signed)
QTc handcalculated for average =  421. OK for first dose of Tikosyn. Dayna Dunn PA-C

## 2013-12-07 NOTE — Progress Notes (Signed)
ANTICOAGULATION CONSULT NOTE - Initial Consult  Pharmacy Consult for coumadin Indication: atrial fibrillation  Allergies  Allergen Reactions  . Trazodone And Nefazodone Other (See Comments)    Weakness.  . Citalopram Hydrobromide Other (See Comments)    Headache and insomnia    Patient Measurements: Height: 5\' 4"  (162.6 cm) Weight: 156 lb 4.9 oz (70.9 kg) IBW/kg (Calculated) : 54.7   Vital Signs: Temp: 97.5 F (36.4 C) (05/27 1604) Temp src: Oral (05/27 1604) BP: 107/75 mmHg (05/27 1604) Pulse Rate: 81 (05/27 1604)  Labs:  Recent Labs  12/06/13 0955 12/07/13 0954 12/07/13 1013  LABPROT 22.7*  --   --   INR 2.08* 3.4  --   CREATININE 0.89  --  0.92    Estimated Creatinine Clearance: 54.2 ml/min (by C-G formula based on Cr of 0.92).   Medical History: Past Medical History  Diagnosis Date  . Hx of atrial flutter july 2009    atypical atrial flutter  . H/O mitral valve repair [facility] [employer].  . H/O tricuspid valve repair   . Hx of acquired endocarditis 1970; 1986    Prior to mitral valve repair   . Hyperlipidemia     cardiac cath clean coronary arties in the past.  . Glaucoma   . Blepharitis   . Macular degeneration     early , Hecker  . Hypothyroid   . Atrial fibrillation     persistent  . Symptomatic bradycardia     s/p PPM  . Chronic diastolic CHF (congestive heart failure)   . Pneumonia ~ 2003  . Migraine headache     "used to have severe; not as much since hysterectomy"  . 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 Arthritis     "knees"  . Anxiety   . Depression   . Thyroid nodule   . Long term (current) use of anticoagulants     No bleeding    Medications:  Prescriptions prior to admission  Medication Sig Dispense Refill  . ALPRAZolam (XANAX) 0.5 MG tablet Take 0.5 mg by mouth 4 (four) times daily. For anxiety      . Calcium Carbonate-Vitamin D (CALCIUM 600 + D PO) Take 1 tablet by mouth  daily.       . cycloSPORINE (RESTASIS) 0.05 % ophthalmic emulsion Place 1 drop into both eyes 2 (two) times daily.       . dorzolamide-timolol (COSOPT) 22.3-6.8 MG/ML ophthalmic solution Place 1 drop into both eyes 2 (two) times daily.      . furosemide (LASIX) 40 MG tablet TAKE 1 & 1/2 TABLETS ONCE DAILY.  135 tablet  0  . hydrocortisone 1 % ointment Apply 1 application topically 2 (two) times daily. To irritated areas on chest after cardioversion.  30 g  0  . latanoprost (XALATAN) 0.005 % ophthalmic solution Place 1 drop into both eyes daily.       Marland Kitchen levothyroxine (SYNTHROID, LEVOTHROID) 88 MCG tablet Take 88 mcg by mouth daily.      . metoprolol succinate (TOPROL-XL) 25 MG 24 hr tablet TAKE 2 TABLETS DAILY.  180 tablet  1  . Multiple Vitamins-Minerals (ICAPS) CAPS Take 1 capsule by mouth 2 (two) times daily.       . Omega-3 Fatty Acids (FISH OIL) 1000 MG CAPS Take 1,000 mg by mouth 4 (four) times daily - after meals and at bedtime.      Marland Kitchen PARoxetine (PAXIL-CR)  37.5 MG 24 hr tablet Take 37.5 mg by mouth daily.       . potassium chloride SA (K-DUR,KLOR-CON) 20 MEQ tablet Take 20 mEq by mouth daily.      . rosuvastatin (CRESTOR) 40 MG tablet Take 40 mg by mouth daily.      . traMADol (ULTRAM) 50 MG tablet Take 50 mg by mouth every 6 (six) hours as needed. For pain      . warfarin (COUMADIN) 5 MG tablet Take 0.5 tablets (2.5 mg total) by mouth daily.      Marland Kitchen ZIOPTAN 0.0015 % SOLN Place 1 drop into both eyes daily.       Marland Kitchen zolmitriptan (ZOMIG) 5 MG tablet Take 5 mg by mouth as needed. For migraines      . zolpidem (AMBIEN) 10 MG tablet Take 10 mg by mouth at bedtime as needed. For sleep        Assessment: Jo Hughes is a 73 y.o. female who presents today for Tikosyn initiation.  Pharmacy consulted to continue her home coumadin for Afib.  She sees [employer], [facility] at the coumadin clinic. INR on 4/29 was 2.72, 5/1 was 2.4, 5/8 was 2.7, and 5/20 was 2.7. 5/26 was 2.08. INR today is slightly elevated  at 3.4.  No bleeding per pt interview.  ? It seems odd for INR to go from 2.08 yesterday to 3.4 today?   She says her home dose is 5 mg on MWF and 2.5 mg all other days, with last dose 5/26 in the evening.   Goal of Therapy:  INR 2-3   Plan:  1. Coumadin 3 mg po x 1 dose today 2. Daily INR Eudelia Bunch, Pharm.D. [phone] 12/07/2013 5:45 PM

## 2013-12-07 NOTE — Progress Notes (Signed)
Pharmacy Consult for Dofetilide (Tikosyn) Iniation  Admit Complaint: 73 y.o. female admitted 12/07/2013 with atrial fibrillation to be initiated on dofetilide.   Assessment:  Patient Exclusion Criteria: If any screening criteria checked as "Yes", then  patient  should NOT receive dofetilide until criteria item is corrected. If "Yes" please indicate correction plan.  YES  NO Patient  Exclusion Criteria Correction Plan  []  [x]  Baseline QTc interval is greater than or equal to 440 msec. IF above YES box checked dofetilide contraindicated unless patient has ICD; then may proceed if QTc 500-550 msec or with known ventricular conduction abnormalities may proceed with QTc 550-600 msec. QTc =  435   []  [x]  Magnesium level is less than 1.8 mEq/l : Last magnesium:  Lab Results  Component Value Date   MG 2.1 12/07/2013         []  [x]  Potassium level is less than 4 mEq/l : Last potassium:  Lab Results  Component Value Date   K 4.0 12/07/2013         []  [x]  Patient is known or suspected to have a digoxin level greater than 2 ng/ml: No results found for this basename: DIGOXIN      []  [x]  Creatinine clearance less than 20 ml/min (calculated using Cockcroft-Gault, actual body weight and serum creatinine): Estimated Creatinine Clearance: 54.2 ml/min (by C-G formula based on Cr of 0.92).    []  [x]  Patient has received drugs known to prolong the QT intervals within the last 48 hours(phenothiazines, tricyclics or tetracyclic antidepressants, erythromycin, H-1 antihistamines, cisapride, fluoroquinolones, azithromycin). Drugs not listed above may have an, as yet, undetected potential to prolong the QT interval, updated information on QT prolonging agents is available at this website:QT prolonging agents   []  [x]  Patient received a dose of hydrochlorothiazide (Oretic) alone or in any combination including triamterene (Dyazide, Maxzide) in the last 48 hours.   []  [x]  Patient received a medication known to  increase dofetilide plasma concentrations prior to initial dofetilide dose:    Trimethoprim (Primsol, Proloprim) in the last 36 hours   Verapamil (Calan, Verelan) in the last 36 hours or a sustained release dose in the last 72 hours   Megestrol (Megace) in the last 5 days    Cimetidine (Tagamet) in the last 6 hours   Ketoconazole (Nizoral) in the last 24 hours   Itraconazole (Sporanox) in the last 48 hours    Prochlorperazine (Compazine) in the last 36 hours    []  [x]  Patient is known to have a history of torsades de pointes; congenital or acquired long QT syndromes.   []  [x]  Patient has received a Class 1 antiarrhythmic with less than 2 half-lives since last dose. (Disopyramide, Quinidine, Procainamide, Lidocaine, Mexiletine, Flecainide, Propafenone)   []  [x]  Patient has received amiodarone therapy in the past 3 months or amiodarone level is greater than 0.3 ng/ml.    Patient has been appropriately anticoagulated with Coumadin, INR 3.4.  Ordering provider was confirmed at [email or web address] if they are not listed on the [place] Prescrib[facility] list.  Goal of Therapy:  Follow renal function, electrolytes, potential drug interactions, and dose adjustment. Provide education and 1 week supply at discharge.  Plan:  1.  Initiate dofetilide based on renal function: Select One Calculated CrCl  Dose q12h  [x]  > 60 ml/min 500 mcg  []  40-60 ml/min 250 mcg  []  20-40 ml/min 125 mcg   2. Follow up QTc after the first 5 doses, renal function, electrolytes (K & Mg) daily x 3  days, dose adjustment, success of initiation and facilitate 1 week discharge supply as     clinically indicated.  3. Initiate Tikosyn education video (Call [phone] and ask for video # [phone]).  4. Place Enrollment Form on the chart for discharge supply of dofetilide.   Gay Filler Johnathan Tortorelli 5:07 PM 12/07/2013

## 2013-12-07 NOTE — Telephone Encounter (Signed)
New problem    Calling in with a  Stat lab.

## 2013-12-07 NOTE — Telephone Encounter (Signed)
I've already notified patient of labs.  These were for Tikosyn load for today.  She is heading over to hospital later today.  Thanks for the heads up.

## 2013-12-07 NOTE — H&P (Signed)
ELECTROPHYSIOLOGY ADMISSION NOTE   Patient ID: SHAKETTA KUBIN MRN: [id number], DOB/AGE: 1941/04/25   Admit date: 12/07/2013  Primary Physician: Lavone Orn, MD Primary Cardiologist: Daneen Schick, MD Primary EP: Thompson Grayer, MD Reason for Admission: AAD loading with Tikosyn for persistent atrial fibrillation  History of Present Illness Jo Hughes is a 73 y.o. woman with atrial fibrillation s/p AFib ablation April 2015, tachy-brady syndrome s/p PPM implant, valvular heart disease s/p MV repair and TV repair in 2005, dyslipidemia and hypothyroidism who presents for Tikosyn loading for treatment of persistent AFib. She reports fatigue, DOE and exercise intolerance. She denies CP, dizziness, near syncope or syncope. She denies LE swelling, orthopnea or PND. She has normal LV function by recent TEE. She was recently seen in follow-up by Dr. Rayann Heman who recommended AAD therapy for ERAF. After discussing options, Jo Hughes elected admission for Tikosyn. Of note, she has previously failed amiodarone therapy. Her last dose of amiodarone was 05/17/2013. She has been anticoagulated with warfarin and has had INRs > 2.0 for the past 4 weeks. INR on 4/29 was 2.72, 5/1 was 2.4, 5/8 was 2.7, and 5/20 was 2.7.  Past Medical History Past Medical History  Diagnosis Date  . Hx of atrial flutter july 2009    atypical atrial flutter  . H/O mitral valve repair [facility] , [place].  . H/O tricuspid valve repair   . Hx of acquired endocarditis 1970; 1986    Prior to mitral valve repair   . Hyperlipidemia     cardiac cath clean coronary arties in the past.  . Glaucoma   . Blepharitis   . Macular degeneration     early , Hecker  . Hypothyroid   . Atrial fibrillation     persistent  . Symptomatic bradycardia     s/p PPM  . Chronic diastolic CHF (congestive heart failure)   . Pneumonia ~ 2003  . Migraine headache     "used to have severe; not as much since hysterectomy"  . DJD  (degenerative joint disease) of knee     CMC bilaterally, sypher  . Arthritis     "knees"  . Anxiety   . Depression   . Thyroid nodule   . Long term (current) use of anticoagulants     No bleeding    Past Surgical History Past Surgical History  Procedure Laterality Date  . Mitral valve repair  05/2004    [facility]  . Cardioversion  ~ 2011; 09/18/2011    ?; Procedure: CARDIOVERSION;  Surgeon: Sinclair Grooms, MD;  Location: [place];  Service: Cardiovascular;  Laterality: N/A;  . Pacemaker insertion  01/13/12    MDT Adapta L implanted by Dr Rayann Heman  . Tonsillectomy and adenoidectomy  1948  . Abdominal hysterectomy  1986  . Knee cartilage surgery  02/2009    left; Dr. Berenice Primas  . Lumbar laminectomy  1997  . Tricuspid valve surgery  05/2004    "repair; [facility]"  . Back surgery    . Cataract extraction w/ intraocular lens  implant, bilateral  ~ 2011  . Cardiac catheterization      "probably 3 so far" (01/13/12)  . Cardioversion N/A 04/29/2013    Procedure: CARDIOVERSION;  Surgeon: Sinclair Grooms, MD;  Location[facility] ENDOSCOPY;  Service: Cardiovascular;  Laterality: N/A;  . Tee without cardioversion N/A 11/07/2013    Procedure: TRANSESOPHAGEAL ECHOCARDIOGRAM (TEE);  Surgeon: Pixie Casino, MD;  Location: [facility] ENDOSCOPY;  Service: Cardiovascular;  Laterality: N/A;  . Ablation  [id number]    PVI by Dr Rayann Heman     Allergies/Intolerances Allergies  Allergen Reactions  . Trazodone And Nefazodone Other (See Comments)    Weakness.  . Citalopram Hydrobromide Other (See Comments)    Headache and insomnia   Current Home Medications      ALPRAZolam 0.5 MG tablet  Commonly known as:  XANAX  Take 0.5 mg by mouth 4 (four) times daily. For anxiety     CALCIUM 600 + D PO  Take 1 tablet by mouth daily.     cycloSPORINE 0.05 % ophthalmic emulsion  Commonly known as:  RESTASIS  Place 1 drop into both eyes 2 (two) times daily.     dorzolamide-timolol 22.3-6.8 MG/ML ophthalmic  solution  Commonly known as:  COSOPT  Place 1 drop into both eyes 2 (two) times daily.     Fish Oil 1000 MG Caps  Take 1,000 mg by mouth 4 (four) times daily - after meals and at bedtime.     furosemide 40 MG tablet  Commonly known as:  LASIX  TAKE 1 & 1/2 TABLETS ONCE DAILY.     hydrocortisone 1 % ointment  Apply 1 application topically 2 (two) times daily. To irritated areas on chest after cardioversion.     ICAPS Caps  Take 1 capsule by mouth 2 (two) times daily.     latanoprost 0.005 % ophthalmic solution  Commonly known as:  XALATAN  Place 1 drop into both eyes daily.     levothyroxine 88 MCG tablet  Commonly known as:  SYNTHROID, LEVOTHROID  Take 88 mcg by mouth daily.     metoprolol succinate 25 MG 24 hr tablet  Commonly known as:  TOPROL-XL  TAKE 2 TABLETS DAILY.     PARoxetine 37.5 MG 24 hr tablet  Commonly known as:  PAXIL-CR  Take 37.5 mg by mouth daily.     potassium chloride SA 20 MEQ tablet  Commonly known as:  K-DUR,KLOR-CON  Take 20 mEq by mouth daily.     rosuvastatin 40 MG tablet  Commonly known as:  CRESTOR  Take 40 mg by mouth daily.     traMADol 50 MG tablet  Commonly known as:  ULTRAM  Take 50 mg by mouth every 6 (six) hours as needed. For pain     warfarin 5 MG tablet  Commonly known as:  COUMADIN  Take 0.5 tablets (2.5 mg total) by mouth daily.     ZIOPTAN 0.0015 % Soln  Generic drug:  Tafluprost  Place 1 drop into both eyes daily.     zolmitriptan 5 MG tablet  Commonly known as:  ZOMIG  Take 5 mg by mouth as needed. For migraines     zolpidem 10 MG tablet  Commonly known as:  AMBIEN  Take 10 mg by mouth at bedtime as needed. For sleep     Family History Family History  Problem Relation Age of Onset  . Stroke Father   . Heart attack Father 62  . Macular degeneration Mother   . Transient ischemic attack Mother     multiple  . Hypertension Brother      Social History History   Social History  . Marital Status: Married      Spouse Name: N/A    Number of Children: N/A  . Years of Education: N/A   Occupational History  . Not on file.   Social History Main Topics  . Smoking status: Former  Smoker -- 1.00 packs/day for 24 years    Types: Cigarettes    Quit date: 07/14/1985  . Smokeless tobacco: Never Used  . Alcohol Use: 4.0 oz/week    8 drink(s) per week     Comment: wine 4 glasses/wk  . Drug Use: No  . Sexual Activity: Yes   Other Topics Concern  . Not on file   Social History Narrative   Lives in [place] with spouse.  No children.  Retired Education officer, [employer].     Review of Systems General: No chills, fever, night sweats or weight changes  Cardiovascular:  No chest pain, dyspnea on exertion, edema, orthopnea, palpitations, paroxysmal nocturnal dyspnea Dermatological: No rash, lesions or masses Respiratory: No cough, dyspnea Urologic: No hematuria, dysuria Abdominal: No nausea, vomiting, diarrhea, bright red blood per rectum, melena, or hematemesis Neurologic: No visual changes, weakness, changes in mental status All other systems reviewed and are otherwise negative except as noted above.  Physical Exam Vitals: Blood pressure 107/75, pulse 81, temperature 97.5 F (36.4 C), temperature source Oral, resp. rate 20, height 5\' 4"  (1.626 m), weight 156 lb 4.9 oz (70.9 kg), SpO2 96.00%.  General: Well developed, well appearing 73 y.o. female in no acute distress. HEENT: Normocephalic, atraumatic. EOMs intact. Sclera nonicteric. Oropharynx clear.  Neck: Supple. No JVD. Lungs: Respirations regular and unlabored, CTA bilaterally. No wheezes, rales or rhonchi. Heart: Irregularly irregular. S1, S2 present. No murmurs, rub, S3 or S4. Abdomen: Soft, non-distended. BS present x 4 quadrants. Extremities: No clubbing, cyanosis or edema. DP/PT/Radials 2+ and equal bilaterally. Psych: Normal affect. Neuro: Alert and oriented X 3. Moves all extremities spontaneously. Musculoskeletal: No kyphosis. Skin: Intact.  Warm and dry. No rashes or petechiae in exposed areas.   Labs Lab Results  Component Value Date   WBC 10.0 11/09/2013   HGB 12.3 11/09/2013   HCT 38.6 11/09/2013   MCV 90.6 11/09/2013   PLT 151 11/09/2013     Recent Labs Lab 12/07/13 1013  NA 140  K 4.0  CL 101  CO2 27  BUN 20  CREATININE 0.92  CALCIUM 10.5  GLUCOSE 131*   Magnesium 2.1  Recent Labs  12/07/13 0954  INR 3.4    Radiology/Studies No results found.  Transesophageal echocardiogram 11/07/2013 Study Conclusions - Left ventricle: There was mild concentric hypertrophy. Systolic function was mildly reduced. The estimated ejection fraction was in the range of 45% to 50%. Diffuse hypokinesis. - Aortic valve: Structurally normal valve. Trileaflet. No evidence of vegetation. No regurgitation. - Aorta: The aorta was poorly visualized. - Mitral valve: Cosgrove 28 mm annuloplasty ring in place. Tethering of the posterior mitral leaflet. 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 There is mild, posteriorly directed regurgitation. - Left atrium: Heavy smoke. The LAA has been surgically excised. - Right ventricle: Pacer wire or catheter noted in right ventricle. - Right atrium: The atrium was normal in size. Pacer wire or catheter noted in right atrium. No evidence of thrombus in the atrial cavity or appendage. 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 Negative bubble study for PFO. - Tricuspid valve: Moderate regurgitation. Peak RV-RA gradient: 78mm Hg (S). - Line: A venous pacing wire was visualized in the right atrial cavity and right ventricular cavity, with its tip at the RV apex. - Pericardium, extracardiac: There was no pericardial effusion.  12-lead ECG pending  12-lead ECG yesterday at the office - AFib with V pacing at 89 bpm; QTc by Frederica = 439 msec (reviewed by Dr. Caryl Comes)  Telemetry - AFib with V rate in 80s  Assessment and Plan Persistent atrial fibrillation Atypical atrial  flutter Tachy-brady syndrome s/p PPM implant Valvular heart disease s/p MV and TV repair Normal LVEF  Jo Hughes has persistent, symptomatic atrial fibrillation. She was recently evaluated by Dr. Rayann Heman and AAD therapy recommended. She has failed amiodarone previously and her last dose was 05/17/2013. She elected to proceed with admission for Tikosyn loading. She has been counseled regarding the potential side effects of Tikosyn, specifically QT prolongation. She is not taking any QT prolonging / contraindicated medications. She has normal renal function, normal potassium and magnesium levels. Her QTc is acceptable. INRs have been therapeutic for the past 4 weeks. CrCl  62.65. Will start Tikosyn 500 micrograms every 12 hours per protocol with daily labs and serial ECGs.   Darrick Huntsman, PA-C 12/07/2013, 4:26 PM   EP Attending Patient seen and examined. Her extensive history has been reviewed. I agree with the plan to proceed with initiation of tikosyn therapy. I would expect she will be in the hospital admitted for at least 72 hours. Will follow QT and electrolytes. Will plan DCCV if she does not revert back to NSR on her own.  Mikle Bosworth.D.

## 2013-12-08 ENCOUNTER — Ambulatory Visit: Payer: Medicare Other | Admitting: Interventional Cardiology

## 2013-12-08 ENCOUNTER — Encounter: Payer: Self-pay | Admitting: Internal Medicine

## 2013-12-08 DIAGNOSIS — I498 Other specified cardiac arrhythmias: Secondary | ICD-10-CM

## 2013-12-08 DIAGNOSIS — I4892 Unspecified atrial flutter: Secondary | ICD-10-CM

## 2013-12-08 LAB — BASIC METABOLIC PANEL
BUN: 22 mg/dL (ref 6–23)
CO2: 25 mEq/L (ref 19–32)
CREATININE: 0.87 mg/dL (ref 0.50–1.10)
Calcium: 10.4 mg/dL (ref 8.4–10.5)
Chloride: 103 mEq/L (ref 96–112)
GFR, EST AFRICAN AMERICAN: 76 mL/min — AB (ref >90)
GFR, EST NON AFRICAN AMERICAN: 65 mL/min — AB (ref >90)
Glucose, Bld: 98 mg/dL (ref 70–99)
Potassium: 3.7 mEq/L (ref 3.7–5.3)
Sodium: 141 mEq/L (ref 137–147)

## 2013-12-08 LAB — PROTIME-INR
INR: 2.98 — ABNORMAL HIGH (ref 0.00–1.49)
Prothrombin Time: 29.9 seconds — ABNORMAL HIGH (ref 11.6–15.2)

## 2013-12-08 LAB — MAGNESIUM: Magnesium: 2.2 mg/dL (ref 1.5–2.5)

## 2013-12-08 MED ORDER — ZOLPIDEM TARTRATE 5 MG PO TABS: 10.0000 mg | ORAL_TABLET | Freq: Every evening | ORAL | Status: DC | PRN

## 2013-12-08 MED ORDER — POTASSIUM CHLORIDE CRYS ER 20 MEQ PO TBCR
40.0000 meq | EXTENDED_RELEASE_TABLET | Freq: Once | ORAL | Status: AC
  Administered 2013-12-08: 40 meq via ORAL
  Filled 2013-12-08: qty 2

## 2013-12-08 MED ORDER — ALPRAZOLAM 0.5 MG PO TABS
0.5000 mg | ORAL_TABLET | Freq: Three times a day (TID) | ORAL | Status: DC | PRN
  Administered 2013-12-08 – 2013-12-09 (×2): 0.5 mg via ORAL
  Filled 2013-12-08 (×2): qty 1

## 2013-12-08 MED ORDER — PAROXETINE HCL ER 37.5 MG PO TB24
37.5000 mg | ORAL_TABLET | Freq: Every day | ORAL | Status: DC
  Administered 2013-12-08 – 2013-12-10 (×3): 37.5 mg via ORAL
  Filled 2013-12-08 (×3): qty 1

## 2013-12-08 MED ORDER — POTASSIUM CHLORIDE CRYS ER 20 MEQ PO TBCR
20.0000 meq | EXTENDED_RELEASE_TABLET | Freq: Every day | ORAL | Status: DC
  Administered 2013-12-09 – 2013-12-10 (×2): 20 meq via ORAL
  Filled 2013-12-08 (×2): qty 1

## 2013-12-08 MED ORDER — DORZOLAMIDE HCL-TIMOLOL MAL 2-0.5 % OP SOLN
1.0000 [drp] | Freq: Two times a day (BID) | OPHTHALMIC | Status: DC
  Administered 2013-12-08 – 2013-12-10 (×5): 1 [drp] via OPHTHALMIC
  Filled 2013-12-08: qty 10

## 2013-12-08 MED ORDER — LEVOTHYROXINE SODIUM 88 MCG PO TABS
88.0000 ug | ORAL_TABLET | Freq: Every day | ORAL | Status: DC
  Administered 2013-12-09 – 2013-12-10 (×2): 88 ug via ORAL
  Filled 2013-12-08 (×3): qty 1

## 2013-12-08 MED ORDER — WARFARIN SODIUM 2.5 MG PO TABS
2.5000 mg | ORAL_TABLET | Freq: Once | ORAL | Status: AC
  Administered 2013-12-08: 2.5 mg via ORAL
  Filled 2013-12-08: qty 1

## 2013-12-08 MED ORDER — LATANOPROST 0.005 % OP SOLN
1.0000 [drp] | Freq: Every day | OPHTHALMIC | Status: DC
  Administered 2013-12-08 – 2013-12-09 (×2): 1 [drp] via OPHTHALMIC
  Filled 2013-12-08: qty 2.5

## 2013-12-08 MED ORDER — CYCLOSPORINE 0.05 % OP EMUL
1.0000 [drp] | Freq: Two times a day (BID) | OPHTHALMIC | Status: DC
  Administered 2013-12-08 – 2013-12-10 (×5): 1 [drp] via OPHTHALMIC
  Filled 2013-12-08 (×6): qty 1

## 2013-12-08 MED ORDER — ZOLPIDEM TARTRATE 5 MG PO TABS
5.0000 mg | ORAL_TABLET | Freq: Every evening | ORAL | Status: DC | PRN
  Administered 2013-12-09 – 2013-12-10 (×2): 5 mg via ORAL
  Filled 2013-12-08 (×2): qty 1

## 2013-12-08 MED ORDER — METOPROLOL SUCCINATE ER 25 MG PO TB24
25.0000 mg | ORAL_TABLET | Freq: Two times a day (BID) | ORAL | Status: DC
  Administered 2013-12-08 – 2013-12-10 (×4): 25 mg via ORAL
  Filled 2013-12-08 (×6): qty 1

## 2013-12-08 NOTE — Progress Notes (Signed)
   SUBJECTIVE: The patient is doing well today.  At this time, she denies chest pain, shortness of breath, or any new concerns.  Admitted 5-27 for Tikosyn loading.    CURRENT MEDICATIONS: . dofetilide  500 mcg Oral BID  . sodium chloride  3 mL Intravenous Q12H  . Warfarin - Pharmacist Dosing Inpatient   Does not apply q1800      OBJECTIVE: Physical Exam: Filed Vitals:   12/07/13 1604 12/07/13 2048 12/08/13 0442  BP: 107/75 112/83 115/73  Pulse: 81 89 95  Temp: 97.5 F (36.4 C) 97.8 F (36.6 C) 97.6 F (36.4 C)  TempSrc: Oral Oral Oral  Resp: 20 20 18   Height: 5\' 4"  (1.626 m)    Weight: 156 lb 4.9 oz (70.9 kg)    SpO2: 96% 99% 94%    Intake/Output Summary (Last 24 hours) at 12/08/13 5852 Last data filed at 12/07/13 2049  Gross per 24 hour  Intake    600 ml  Output      0 ml  Net    600 ml    Telemetry reveals AV pacing with intermittent SR w V pacing  GEN- The patient is well appearing, alert and oriented x 3 today.   Head- normocephalic, atraumatic Eyes-  Sclera clear, conjunctiva pink Ears- hearing intact Oropharynx- clear Neck- supple, no JVP Lymph- no cervical lymphadenopathy Lungs- Clear to ausculation bilaterally, normal work of breathing Heart- Regular rate and rhythm, no murmurs, rubs or gallops, PMI not laterally displaced GI- soft, NT, ND, + BS Extremities- no clubbing, cyanosis, or edema Neuro- strength and sensation are intact  LABS: Basic Metabolic Panel:  Recent Labs  12/07/13 1013 12/08/13 0325  NA 140 141  K 4.0 3.7  CL 101 103  CO2 27 25  GLUCOSE 131* 98  BUN 20 22  CREATININE 0.92 0.87  CALCIUM 10.5 10.4  MG 2.1 2.2     ASSESSMENT AND PLAN:  Active Problems:   AF (atrial fibrillation)  1. Afib/ atypical atrial flutter Now in sinus with tikosyn Will follow QT closely Continue coumadin  2. Hypokalemia Replete  3. Sinus bradycardia/ SSS Device reprogrammed today to DDIR to prevent inappropriate ventricular pacing.   Will probably reprogram with MVP.

## 2013-12-08 NOTE — Progress Notes (Signed)
ANTICOAGULATION CONSULT NOTE - Follow-up Consult  Pharmacy Consult for coumadin Indication: atrial fibrillation  Allergies  Allergen Reactions  . Trazodone And Nefazodone Other (See Comments)    Weakness.  . Citalopram Hydrobromide Other (See Comments)    Headache and insomnia    Patient Measurements: Height: 5\' 4"  (162.6 cm) Weight: 156 lb 4.9 oz (70.9 kg) IBW/kg (Calculated) : 54.7   Vital Signs: Temp: 97.6 F (36.4 C) (05/28 0442) Temp src: Oral (05/28 0442) BP: 95/60 mmHg (05/28 1047) Pulse Rate: 60 (05/28 1047)  Labs:  Recent Labs  12/06/13 0955 12/07/13 0954 12/07/13 1013 12/08/13 0325  LABPROT 22.7*  --   --  29.9*  INR 2.08* 3.4  --  2.98*  CREATININE 0.89  --  0.92 0.87    Estimated Creatinine Clearance: 57.3 ml/min (by C-G formula based on Cr of 0.87).  Assessment: Jo Hughes is a 73 y.o. female who is in hospital for Tikosyn initiation. Pt continues on coumadin for afib. INR 2.98 today. Likely can restart home dose of coumadin (5mg  M/W/F and 2.5mg  all other days). No bleeding noted.  Goal of Therapy:  INR 2-3   Plan:  1. Coumadin 2.5 mg po x 1 dose today 2. Daily INR  Sherlon Handing, PharmD, BCPS Clinical pharmacist, pager [phone] 12/08/2013 11:18 AM

## 2013-12-08 NOTE — Progress Notes (Signed)
Utilization review completed. Azion Centrella, RN, BSN. 

## 2013-12-08 NOTE — Progress Notes (Signed)
Pt up ambulating in hallway at this time; no needs voiced; will cont. To monitor.

## 2013-12-08 NOTE — Progress Notes (Signed)
EKG is reviewed.  Qtc is 499.  Continue current tikosyn dose and follow closely.

## 2013-12-09 LAB — BASIC METABOLIC PANEL
BUN: 14 mg/dL (ref 6–23)
CO2: 23 mEq/L (ref 19–32)
Calcium: 10.2 mg/dL (ref 8.4–10.5)
Chloride: 105 mEq/L (ref 96–112)
Creatinine, Ser: 0.8 mg/dL (ref 0.50–1.10)
GFR calc non Af Amer: 72 mL/min — ABNORMAL LOW (ref >90)
GFR, EST AFRICAN AMERICAN: 84 mL/min — AB (ref >90)
GLUCOSE: 91 mg/dL (ref 70–99)
POTASSIUM: 4.5 meq/L (ref 3.7–5.3)
SODIUM: 140 meq/L (ref 137–147)

## 2013-12-09 LAB — MAGNESIUM: Magnesium: 1.9 mg/dL (ref 1.5–2.5)

## 2013-12-09 LAB — PROTIME-INR
INR: 2.93 — AB (ref 0.00–1.49)
Prothrombin Time: 29.5 seconds — ABNORMAL HIGH (ref 11.6–15.2)

## 2013-12-09 MED ORDER — WARFARIN SODIUM 2.5 MG PO TABS
2.5000 mg | ORAL_TABLET | ORAL | Status: AC
  Administered 2013-12-10: 2.5 mg via ORAL
  Filled 2013-12-09: qty 1

## 2013-12-09 MED ORDER — WARFARIN SODIUM 5 MG PO TABS
5.0000 mg | ORAL_TABLET | ORAL | Status: AC
  Administered 2013-12-09: 5 mg via ORAL
  Filled 2013-12-09: qty 1

## 2013-12-09 NOTE — Progress Notes (Signed)
Spoke with Cecilie Kicks, NP; new orders to hold Tikosyn for now; will cont. To monitor.

## 2013-12-09 NOTE — Progress Notes (Signed)
MD paged at this time regarding EKG's done earlier today and Tikosyn dosing for this evening; will await callback.

## 2013-12-09 NOTE — Progress Notes (Signed)
   SUBJECTIVE: The patient is doing well today.  At this time, she denies chest pain, shortness of breath, or any new concerns.  Admitted 5-27 for Tikosyn loading.    Labs pending this morning, QTc stable.   CURRENT MEDICATIONS: . cycloSPORINE  1 drop Both Eyes BID  . dofetilide  500 mcg Oral BID  . dorzolamide-timolol  1 drop Both Eyes BID  . latanoprost  1 drop Both Eyes QHS  . levothyroxine  88 mcg Oral QAC breakfast  . metoprolol succinate  25 mg Oral BID  . PARoxetine  37.5 mg Oral Daily  . potassium chloride SA  20 mEq Oral Daily  . sodium chloride  3 mL Intravenous Q12H  . Warfarin - Pharmacist Dosing Inpatient   Does not apply q1800      OBJECTIVE: Physical Exam: Filed Vitals:   12/08/13 1047 12/08/13 1315 12/08/13 2135 12/09/13 0531  BP: 95/60 102/60 136/64 118/78  Pulse: 60 92 97 94  Temp:  97.7 F (36.5 C) 97.8 F (36.6 C) 98.1 F (36.7 C)  TempSrc:  Oral Oral Oral  Resp:  18 18 20   Height:      Weight:      SpO2:  98% 100% 99%    Intake/Output Summary (Last 24 hours) at 12/09/13 0703 Last data filed at 12/08/13 1643  Gross per 24 hour  Intake   1560 ml  Output      0 ml  Net   1560 ml    Telemetry reveals AV pacing with intermittent SR w V pacing  GEN- The patient is well appearing, alert and oriented x 3 today.   Head- normocephalic, atraumatic Eyes-  Sclera clear, conjunctiva pink Ears- hearing intact Oropharynx- clear Neck- supple, no JVP Lymph- no cervical lymphadenopathy Lungs- Clear to ausculation bilaterally, normal work of breathing Heart- Regular rate and rhythm, no murmurs, rubs or gallops, PMI not laterally displaced GI- soft, NT, ND, + BS Extremities- no clubbing, cyanosis, or edema Neuro- strength and sensation are intact  LABS: Basic Metabolic Panel:  Recent Labs  12/07/13 1013 12/08/13 0325  NA 140 141  K 4.0 3.7  CL 101 103  CO2 27 25  GLUCOSE 131* 98  BUN 20 22  CREATININE 0.92 0.87  CALCIUM 10.5 10.4  MG 2.1  2.2     ASSESSMENT AND PLAN:  Active Problems:   AF (atrial fibrillation)  1. Afib/ atypical atrial flutter Now in sinus with tikosyn Will follow QT closely Continue coumadin Expect to discharge home tomorrow   2. Sinus bradycardia/ SSS Device reprogrammed today to MVP to prevent inappropriate ventricular pacing   Gregg Taylor,M.D.

## 2013-12-09 NOTE — Progress Notes (Signed)
I have reviewed the ekgs from earlier today.  The computer is reading the p wave into the QT interval and overestimates the QT interval.   The QTc interval on ekg earlier today is actually 490 msec.  OK to continue tikosyn 500 mcg BID at this time.  Jeneen Rinks Dave Mergen,MD

## 2013-12-09 NOTE — Progress Notes (Signed)
ANTICOAGULATION CONSULT NOTE - Follow-up Consult  Pharmacy Consult for coumadin Indication: atrial fibrillation  Allergies  Allergen Reactions  . Trazodone And Nefazodone Other (See Comments)    Weakness.  . Citalopram Hydrobromide Other (See Comments)    Headache and insomnia    Patient Measurements: Height: 5\' 4"  (162.6 cm) Weight: 156 lb 4.9 oz (70.9 kg) IBW/kg (Calculated) : 54.7   Vital Signs: Temp: 97 F (36.1 C) (05/29 1356) Temp src: Oral (05/29 1356) BP: 111/64 mmHg (05/29 1356) Pulse Rate: 92 (05/29 1356)  Labs:  Recent Labs  12/07/13 0954 12/07/13 1013 12/08/13 0325 12/09/13 0500  LABPROT  --   --  29.9* 29.5*  INR 3.4  --  2.98* 2.93*  CREATININE  --  0.92 0.87 0.80    Estimated Creatinine Clearance: 62.3 ml/min (by C-G formula based on Cr of 0.8).  Assessment: Jo Hughes is a 73 y.o. female who is in hospital for Tikosyn initiation. Pt continues on coumadin for afib. INR 2.93 today - remains at upper end of therapeutic range. Likely can restart home dose of coumadin (5mg  M/W/F and 2.5mg  all other days). No bleeding noted.  Goal of Therapy:  INR 2-3   Plan:  1. Restart home Coumadin dose of 5mg  po Mon, Wed, and Friday and 2.5mg  po all other days 2. Daily INR - if remains stable, will consider change to M/W/F  Sherlon Handing, PharmD, BCPS Clinical pharmacist, pager [phone] 12/09/2013 2:18 PM

## 2013-12-09 NOTE — Care Management Note (Signed)
    Page 1 of 1   12/10/2013     4:20:21 PM CARE MANAGEMENT NOTE 12/10/2013  Patient:  Jo Hughes, Jo Hughes   Account Number:  [id number]  Date Initiated:  12/09/2013  Documentation initiated by:  AMERSON,JULIE  Subjective/Objective Assessment:   Pt adm on 12/07/13 with Afib for Tikosyn loading.  PTA, pt independent, lives with spouse.     Action/Plan:   CM referral for Tikosyn arrangements.   Anticipated DC Date:  12/10/2013   Anticipated DC Plan:  [place]  CM consult  Medication Assistance      Choice offered to / List presented to:             Status of service:  Completed, signed off Medicare Important Message given?   (If response is "NO", the following Medicare IM given date fields will be blank) Date Medicare IM given:   Date Additional Medicare IM given:    Discharge Disposition:  HOME/SELF CARE  Per UR Regulation:  Reviewed for med. necessity/level of care/duration of stay  If discussed at [place] of Stay Meetings, dates discussed:    Comments:  12/10/13 16:15 RN called to calrify Tikosyn arrangement.  RN will tube our in house pharmacy the 7 day supply prescription and give other Tikosyn prescription to pt to have filled at outside pharmacy.  No other CM needs were communicated.  Mariane Masters, BSN, [place] [phone].  12/09/13 Ellan Lambert, RN, BSN [phone]  Tikosyn coverage:  per rep at [facility] :  $50.00 co-pay at retail for 30 day supply/ no auth required/ patient can use most major retail pharmacies  [facility], pt's pharmacy is on [address] and does have current dose in stock.  Pt will need one week's supply of Tikosyn to be filled through [facility] prior to dc home.  Please leave extra RX.

## 2013-12-10 ENCOUNTER — Other Ambulatory Visit: Payer: Medicare Other

## 2013-12-10 ENCOUNTER — Encounter (HOSPITAL_COMMUNITY): Payer: Self-pay | Admitting: Physician Assistant

## 2013-12-10 DIAGNOSIS — H3530 Unspecified macular degeneration: Secondary | ICD-10-CM | POA: Diagnosis present

## 2013-12-10 DIAGNOSIS — E039 Hypothyroidism, unspecified: Secondary | ICD-10-CM | POA: Diagnosis present

## 2013-12-10 DIAGNOSIS — I5032 Chronic diastolic (congestive) heart failure: Secondary | ICD-10-CM | POA: Diagnosis present

## 2013-12-10 DIAGNOSIS — E785 Hyperlipidemia, unspecified: Secondary | ICD-10-CM | POA: Diagnosis present

## 2013-12-10 LAB — BASIC METABOLIC PANEL
BUN: 12 mg/dL (ref 6–23)
CALCIUM: 10.4 mg/dL (ref 8.4–10.5)
CO2: 23 mEq/L (ref 19–32)
Chloride: 108 mEq/L (ref 96–112)
Creatinine, Ser: 0.86 mg/dL (ref 0.50–1.10)
GFR, EST AFRICAN AMERICAN: 77 mL/min — AB (ref >90)
GFR, EST NON AFRICAN AMERICAN: 66 mL/min — AB (ref >90)
Glucose, Bld: 100 mg/dL — ABNORMAL HIGH (ref 70–99)
Potassium: 4.3 mEq/L (ref 3.7–5.3)
Sodium: 143 mEq/L (ref 137–147)

## 2013-12-10 LAB — MAGNESIUM: MAGNESIUM: 2 mg/dL (ref 1.5–2.5)

## 2013-12-10 LAB — PROTIME-INR
INR: 2.45 — ABNORMAL HIGH (ref 0.00–1.49)
PROTHROMBIN TIME: 25.8 s — AB (ref 11.6–15.2)

## 2013-12-10 MED ORDER — DOFETILIDE 500 MCG PO CAPS: 500.0000 ug | ORAL_CAPSULE | Freq: Two times a day (BID) | ORAL | Status: DC
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
## 2013-12-10 NOTE — Progress Notes (Signed)
Patient ID: Jo Hughes, female   DOB: 22-Apr-1941, 73 y.o.   MRN: [id number]   SUBJECTIVE: The patient is doing well today.  At this time, she denies chest pain, shortness of breath, or any new concerns.  Admitted 5-27 for Tikosyn loading.    Labs pending this morning, QTc stable.   CURRENT MEDICATIONS: . cycloSPORINE  1 drop Both Eyes BID  . dofetilide  500 mcg Oral BID  . dorzolamide-timolol  1 drop Both Eyes BID  . latanoprost  1 drop Both Eyes QHS  . levothyroxine  88 mcg Oral QAC breakfast  . metoprolol succinate  25 mg Oral BID  . PARoxetine  37.5 mg Oral Daily  . potassium chloride SA  20 mEq Oral Daily  . sodium chloride  3 mL Intravenous Q12H  . warfarin  2.5 mg Oral Q T,Th,S,Su-1800  . Warfarin - Pharmacist Dosing Inpatient   Does not apply q1800      OBJECTIVE: Physical Exam: Filed Vitals:   12/09/13 1356 12/09/13 2119 12/10/13 0623 12/10/13 1031  BP: 111/64 145/83 120/66 104/65  Pulse: 92 103 97 95  Temp: 97 F (36.1 C) 98.3 F (36.8 C) 98.3 F (36.8 C)   TempSrc: Oral Oral Oral   Resp: 20 20 18    Height:      Weight:      SpO2: 97% 93%      Intake/Output Summary (Last 24 hours) at 12/10/13 1138 Last data filed at 12/10/13 0818  Gross per 24 hour  Intake   1080 ml  Output      0 ml  Net   1080 ml    Telemetry reveals AV pacing with intermittent SR w V pacing  GEN- The patient is well appearing, alert and oriented x 3 today.   Head- normocephalic, atraumatic Eyes-  Sclera clear, conjunctiva pink Ears- hearing intact Oropharynx- clear Neck- supple, no JVP Lymph- no cervical lymphadenopathy Lungs- Clear to ausculation bilaterally, normal work of breathing Heart- Regular rate and rhythm, no murmurs, rubs or gallops, PMI not laterally displaced GI- soft, NT, ND, + BS Extremities- no clubbing, cyanosis, or edema Neuro- strength and sensation are intact  LABS: Basic Metabolic Panel:  Recent Labs  12/09/13 0500 12/10/13 0425  NA 140 143  K  4.5 4.3  CL 105 108  CO2 23 23  GLUCOSE 91 100*  BUN 14 12  CREATININE 0.80 0.86  CALCIUM 10.2 10.4  MG 1.9 2.0     ASSESSMENT AND PLAN:  Active Problems:   AF (atrial fibrillation)  1. Afib/ atypical atrial flutter Now in sinus with tikosyn Will follow QT closely, today QTC about 475 will repeat prior to discharge Continue coumadin Expect to discharge home today   2. Sinus bradycardia/ SSS Device has been reprogrammed to MVP to prevent inappropriate ventricular pacing   Mikle Bosworth.D.

## 2013-12-10 NOTE — Progress Notes (Signed)
Nursing note Patient given discharge instructions, avs, medication list and pt prescription sent to pt personal pharmacy. Patient received. 7day supply of Tikosyn to take home. Will discharge home as ordered. Bettina Gavia Avryl Roehm RN

## 2013-12-10 NOTE — Progress Notes (Signed)
ANTICOAGULATION CONSULT NOTE - Follow-up Consult  Pharmacy Consult for coumadin Indication: atrial fibrillation  Allergies  Allergen Reactions  . Trazodone And Nefazodone Other (See Comments)    Weakness.  . Citalopram Hydrobromide Other (See Comments)    Headache and insomnia    Patient Measurements: Height: 5\' 4"  (162.6 cm) Weight: 156 lb 4.9 oz (70.9 kg) IBW/kg (Calculated) : 54.7   Vital Signs: Temp: 98.3 F (36.8 C) (05/30 0623) Temp src: Oral (05/30 0623) BP: 104/65 mmHg (05/30 1031) Pulse Rate: 95 (05/30 1031)  Labs:  Recent Labs  12/08/13 0325 12/09/13 0500 12/10/13 0425  LABPROT 29.9* 29.5* 25.8*  INR 2.98* 2.93* 2.45*  CREATININE 0.87 0.80 0.86    Estimated Creatinine Clearance: 58 ml/min (by C-G formula based on Cr of 0.86).  Assessment: Jo Hughes is a 73 y.o. female who is in hospital for Tikosyn initiation. Pt continues on coumadin for afib. INR remains therapeutic -back on home dose of coumadin. No bleeding noted.  Goal of Therapy:  INR 2-3   Plan:  1. Coumadin 5mg  po Mon, Wed, and Friday and 2.5mg  po all other days 2. Daily INR  3. Noted probable d/c home today  Sherlon Handing, PharmD, BCPS Clinical pharmacist, pager [phone] 12/10/2013 12:56 PM

## 2013-12-12 ENCOUNTER — Telehealth: Payer: Self-pay | Admitting: Internal Medicine

## 2013-12-12 NOTE — Telephone Encounter (Signed)
New Message:  Pt is requesting to speak w/ Claiborne Billings. States per her discharge instructions she is to see Dr. Rayann Heman in one week... She is currently scheduled for 7/1; however, Dr. Jackalyn Lombard schedule is full. Pt wants to know if she can be worked in to see Dr. Rayann Heman this week.

## 2013-12-13 ENCOUNTER — Other Ambulatory Visit: Payer: Self-pay

## 2013-12-13 ENCOUNTER — Ambulatory Visit (INDEPENDENT_AMBULATORY_CARE_PROVIDER_SITE_OTHER): Payer: Medicare Other | Admitting: Pharmacist

## 2013-12-13 VITALS — BP 110/70 | HR 91 | Ht 64.0 in | Wt 156.0 lb

## 2013-12-13 DIAGNOSIS — I4891 Unspecified atrial fibrillation: Secondary | ICD-10-CM

## 2013-12-13 DIAGNOSIS — Z79899 Other long term (current) drug therapy: Secondary | ICD-10-CM

## 2013-12-13 LAB — BASIC METABOLIC PANEL
BUN: 17 mg/dL (ref 6–23)
CO2: 27 mEq/L (ref 19–32)
Calcium: 10.2 mg/dL (ref 8.4–10.5)
Chloride: 105 mEq/L (ref 96–112)
Creatinine, Ser: 0.8 mg/dL (ref 0.4–1.2)
GFR: 78.43 mL/min (ref >60.00)
Glucose, Bld: 92 mg/dL (ref 70–99)
POTASSIUM: 3.8 meq/L (ref 3.5–5.1)
Sodium: 139 mEq/L (ref 135–145)

## 2013-12-13 LAB — POCT INR: INR: 2.2

## 2013-12-13 LAB — MAGNESIUM: MAGNESIUM: 1.9 mg/dL (ref 1.5–2.5)

## 2013-12-13 MED ORDER — POTASSIUM CHLORIDE CRYS ER 20 MEQ PO TBCR: 20.0000 meq | EXTENDED_RELEASE_TABLET | Freq: Every day | ORAL | Status: DC

## 2013-12-13 NOTE — Telephone Encounter (Signed)
Keep July appointment and cal if problems before

## 2013-12-13 NOTE — Progress Notes (Signed)
Patient is seen today for Tikosyn follow up.  She was admitted from 12/07/13 to 12/10/13 to start Tikosyn.  Based on renal functino she was started on 500 mcg bid.  She converted to sinus rhythm with tikosyn and her QTc in hospital was ~475.  She was continued on coumadin in the hospital and set up for recheck in clinic 3-4 days after discharge.  Her device was reprogrammed on 12/08/13 to DDIR to prevent inappropriate ventricular pacing and then reprogrammed with MVP before discharge.    Since discharge, patient states he is feeling better, and has more energy. She has been compliant with Tikosyn. She tells me that she has had a mild headache since discharge, but not sure if this is due to Tikosyn or not.  It is not bothersome though.  She tells me the pharmacy has already called to tell her the Tikosyn was ready, so there will not be an issue getting this filled at her pharmacy.  She is due to see Dr. Rayann Heman next month, but will be seeing me in coumadin clinic 1-2 times this month in case there are any problems.    EKG reviewed by Dr. Lovena Le.  EKG is similar to discharge, and is normal sinus.  Ventricular rate of 91 bpm.  Dr. Lovena Le corrected the QTc, and determined it was 340 msec.  Current Outpatient Prescriptions  Medication Sig Dispense Refill  . ALPRAZolam (XANAX) 0.5 MG tablet Take 0.5 mg by mouth 4 (four) times daily. For anxiety      . Calcium Carbonate-Vitamin D (CALCIUM 600 + D PO) Take 1 tablet by mouth daily.       . cycloSPORINE (RESTASIS) 0.05 % ophthalmic emulsion Place 1 drop into both eyes 2 (two) times daily.       Marland Kitchen dofetilide (TIKOSYN) 500 MCG capsule Take 1 capsule (500 mcg total) by mouth 2 (two) times daily.  60 capsule  11  . dorzolamide-timolol (COSOPT) 22.3-6.8 MG/ML ophthalmic solution Place 1 drop into both eyes 2 (two) times daily.      . furosemide (LASIX) 40 MG tablet Take 60 mg by mouth daily.      . hydrocortisone 1 % ointment Apply 1 application topically 2 (two) times  daily as needed (for rash).      Marland Kitchen latanoprost (XALATAN) 0.005 % ophthalmic solution Place 1 drop into both eyes at bedtime.       Marland Kitchen levothyroxine (SYNTHROID, LEVOTHROID) 88 MCG tablet Take 88 mcg by mouth daily.      . metoprolol succinate (TOPROL-XL) 25 MG 24 hr tablet Take 25 mg by mouth 2 (two) times daily.      . Multiple Vitamins-Minerals (ICAPS) CAPS Take 1 capsule by mouth 2 (two) times daily.       . Omega-3 Fatty Acids (FISH OIL) 1000 MG CAPS Take 1,000 mg by mouth 4 (four) times daily - after meals and at bedtime.      Marland Kitchen PARoxetine (PAXIL-CR) 37.5 MG 24 hr tablet Take 37.5 mg by mouth daily.       . potassium chloride SA (K-DUR,KLOR-CON) 20 MEQ tablet Take 1 tablet (20 mEq total) by mouth daily.  30 tablet  6  . rosuvastatin (CRESTOR) 40 MG tablet Take 40 mg by mouth daily.      . traMADol (ULTRAM) 50 MG tablet Take 50 mg by mouth at bedtime as needed for moderate pain. For pain      . warfarin (COUMADIN) 5 MG tablet Take 2.5-5 mg by mouth daily.  Take 1 tablet on Monday, Wednesday and Friday.  Take 0.5 tablet on Sunday, Tuesday, Thursday and Saturday.      Marland Kitchen ZIOPTAN 0.0015 % SOLN Place 1 drop into both eyes at bedtime.       Marland Kitchen zolmitriptan (ZOMIG) 5 MG tablet Take 5 mg by mouth daily as needed for migraine.       Marland Kitchen zolpidem (AMBIEN) 10 MG tablet Take 10 mg by mouth at bedtime as needed. For sleep       No current facility-administered medications for this visit.   Allergies  Allergen Reactions  . Trazodone And Nefazodone Other (See Comments)    Weakness.  . Citalopram Hydrobromide Other (See Comments)    Headache and insomnia     Assessment/Plan:  Patient feeling better since starting Tikosyn.  Corrected QTc (Dr. Lovena Le) of 340 msec, and ventricular rate of 91.  INR was 2.2 today. Labs reviewed:  K-3.8, Mg 1.9.   Patient will take an additional 5 mg of warfarin tonight, then resume 5 mg qd except 10 mg M/W/F and recheck INR in 2 weeks.  She will increase her K-dur from 20 mEq  qd up to 30 mEq qday (1.5 tablets of potassium daily) as her potassium is < 4.0 and historically runs in the high 3's, and will recheck BMET in 2 weeks when here for protime.  She will call Dr. Rayann Heman if any problems arise before her f/u appointment with him in 4 weeks.  Patient notified of labs and shows verbal understanding.

## 2013-12-14 MED ORDER — POTASSIUM CHLORIDE CRYS ER 20 MEQ PO TBCR: 30.0000 meq | EXTENDED_RELEASE_TABLET | Freq: Every day | ORAL | Status: DC

## 2013-12-28 ENCOUNTER — Other Ambulatory Visit (INDEPENDENT_AMBULATORY_CARE_PROVIDER_SITE_OTHER): Payer: Medicare Other

## 2013-12-28 ENCOUNTER — Ambulatory Visit (INDEPENDENT_AMBULATORY_CARE_PROVIDER_SITE_OTHER): Payer: Medicare Other | Admitting: Pharmacist

## 2013-12-28 DIAGNOSIS — I4891 Unspecified atrial fibrillation: Secondary | ICD-10-CM

## 2013-12-28 DIAGNOSIS — Z79899 Other long term (current) drug therapy: Secondary | ICD-10-CM

## 2013-12-28 LAB — BASIC METABOLIC PANEL
BUN: 19 mg/dL (ref 6–23)
CO2: 29 mEq/L (ref 19–32)
Calcium: 10.8 mg/dL — ABNORMAL HIGH (ref 8.4–10.5)
Chloride: 104 mEq/L (ref 96–112)
Creatinine, Ser: 0.8 mg/dL (ref 0.4–1.2)
GFR: 71.91 mL/min (ref >60.00)
Glucose, Bld: 104 mg/dL — ABNORMAL HIGH (ref 70–99)
POTASSIUM: 4.1 meq/L (ref 3.5–5.1)
Sodium: 138 mEq/L (ref 135–145)

## 2013-12-28 LAB — POCT INR: INR: 2.3

## 2014-01-11 ENCOUNTER — Encounter: Payer: Self-pay | Admitting: Internal Medicine

## 2014-01-11 ENCOUNTER — Ambulatory Visit (INDEPENDENT_AMBULATORY_CARE_PROVIDER_SITE_OTHER): Payer: Medicare Other | Admitting: Internal Medicine

## 2014-01-11 VITALS — BP 119/74 | HR 93 | Ht 64.0 in | Wt 154.6 lb

## 2014-01-11 DIAGNOSIS — I4892 Unspecified atrial flutter: Secondary | ICD-10-CM

## 2014-01-11 DIAGNOSIS — I484 Atypical atrial flutter: Secondary | ICD-10-CM

## 2014-01-11 DIAGNOSIS — I4891 Unspecified atrial fibrillation: Secondary | ICD-10-CM

## 2014-01-11 DIAGNOSIS — I498 Other specified cardiac arrhythmias: Secondary | ICD-10-CM

## 2014-01-11 DIAGNOSIS — I5032 Chronic diastolic (congestive) heart failure: Secondary | ICD-10-CM

## 2014-01-11 DIAGNOSIS — R001 Bradycardia, unspecified: Secondary | ICD-10-CM

## 2014-01-11 DIAGNOSIS — Z950 Presence of cardiac pacemaker: Secondary | ICD-10-CM

## 2014-01-11 NOTE — Progress Notes (Signed)
PCP:  Irven Shelling, MD Primary Cardiologist:  Dr Tamala Julian  The patient presents today for routine electrophysiology followup.  Since recently starting tikosyn, the patient reports doing very well.  She is maintaining sinus rhythm.  She reports less fatigue.  Today, she denies symptoms of chest pain, shortness of breath, orthopnea, PND, lower extremity edema, dizziness, presyncope, syncope, or neurologic sequela.  The patient feels that she is tolerating medications without difficulties and is otherwise without complaint today.   Past Medical History  Diagnosis Date  . H/O mitral valve repair [facility] , [place].  . H/O tricuspid valve repair   . Hx of acquired endocarditis 1970; 1986    Prior to mitral valve repair   . Hyperlipidemia     cardiac cath clean coronary arties in the past.  . Glaucoma   . Macular degeneration     early , [facility]  . Hypothyroid   . Atrial fibrillation     persistent  . Symptomatic bradycardia     s/p PPM  . Chronic diastolic CHF (congestive heart failure)   . Anxiety   . Thyroid nodule   . Long term (current) use of anticoagulants     No bleeding  . Kidney stone 10/2013    "pass it"  . 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 Mitral valve repair  05/2004    [facility]  . Cardioversion  ~ 2011; 09/18/2011    ?; Procedure: CARDIOVERSION;  Surgeon: Sinclair Grooms, MD;  Location: [facility];  Service: Cardiovascular;  Laterality: N/A;  . Abdominal hysterectomy  1986  . Knee cartilage surgery Left 02/2009    scope; Dr. Berenice Primas  . Lumbar laminectomy  1997  . Tricuspid valve surgery  05/2004    "repair; [facility]"  . Back surgery    . Cataract extraction w/ intraocular lens  implant, bilateral  ~ 2011  . Cardiac catheterization  X 3  . Cardioversion N/A 04/29/2013    Procedure: CARDIOVERSION;  Surgeon: Sinclair Grooms, MD;  Location: [facility]  ENDOSCOPY;  Service: Cardiovascular;  Laterality: N/A;  . Tee without cardioversion N/A 11/07/2013    Procedure: TRANSESOPHAGEAL ECHOCARDIOGRAM (TEE);  Surgeon: Pixie Casino, MD;  Location: [facility] ENDOSCOPY;  Service: Cardiovascular;  Laterality: N/A;  . Atrial fibrillation ablation  11/08/13    [facility] by Dr Rayann Heman   . Insert / replace / remove pacemaker  01/13/2012    MDT Adapta L implanted by Dr Rayann Heman  . Dilation and curettage of uterus    . Tonsillectomy  1948  . Breast biopsy Left X 2    "benign"    Current Outpatient Prescriptions  Medication Sig Dispense Refill  . ALPRAZolam (XANAX) 0.5 MG tablet Take 0.5 mg by mouth 4 (four) times daily. For anxiety      . Calcium Carbonate-Vitamin D (CALCIUM 600 + D PO) Take 1 tablet by mouth daily.       . cycloSPORINE (RESTASIS) 0.05 % ophthalmic emulsion Place 1 drop into both eyes 2 (two) times daily.       Marland Kitchen dofetilide (TIKOSYN) 500 MCG capsule Take 1 capsule (500 mcg total) by mouth 2 (two) times daily.  60 capsule  11  . dorzolamide-timolol (COSOPT) 22.3-6.8 MG/ML ophthalmic solution Place 1 drop into both eyes 2 (two) times daily.      . furosemide (LASIX) 40 MG tablet Take  60 mg by mouth daily.      . hydrocortisone 1 % ointment Apply 1 application topically 2 (two) times daily as needed (for rash).      Marland Kitchen latanoprost (XALATAN) 0.005 % ophthalmic solution Place 1 drop into both eyes at bedtime.       Marland Kitchen levothyroxine (SYNTHROID, LEVOTHROID) 88 MCG tablet Take 88 mcg by mouth daily.      . metoprolol succinate (TOPROL-XL) 25 MG 24 hr tablet Take 25 mg by mouth 2 (two) times daily.      . Multiple Vitamins-Minerals (ICAPS) CAPS Take 1 capsule by mouth 2 (two) times daily.       . Omega-3 Fatty Acids (FISH OIL) 1000 MG CAPS Take 1,000 mg by mouth 4 (four) times daily - after meals and at bedtime.      Marland Kitchen PARoxetine (PAXIL-CR) 37.5 MG 24 hr tablet Take 37.5 mg by mouth daily.       . potassium chloride SA (K-DUR,KLOR-CON) 20 MEQ tablet Take 1.5  tablets (30 mEq total) by mouth daily.  45 tablet  1  . rosuvastatin (CRESTOR) 40 MG tablet Take 40 mg by mouth daily.      . traMADol (ULTRAM) 50 MG tablet Take 50 mg by mouth at bedtime as needed for moderate pain. For pain      . warfarin (COUMADIN) 5 MG tablet Take 2.5-5 mg by mouth daily. Take 1 tablet on Monday, Wednesday and Friday.  Take 0.5 tablet on Sunday, Tuesday, Thursday and Saturday.      Marland Kitchen ZIOPTAN 0.0015 % SOLN Place 1 drop into both eyes at bedtime.       Marland Kitchen zolmitriptan (ZOMIG) 5 MG tablet Take 5 mg by mouth daily as needed for migraine.       Marland Kitchen zolpidem (AMBIEN) 10 MG tablet Take 10 mg by mouth at bedtime as needed. For sleep       No current facility-administered medications for this visit.    Allergies  Allergen Reactions  . Trazodone And Nefazodone Other (See Comments)    Weakness.  . Citalopram Hydrobromide Other (See Comments)    Headache and insomnia    History   Social History  . Marital Status: Married    Spouse Name: N/A    Number of Children: N/A  . Years of Education: N/A   Occupational History  . Not on file.   Social History Main Topics  . Smoking status: Former Smoker -- 1.00 packs/day for 24 years    Types: Cigarettes    Quit date: 07/14/1985  . Smokeless tobacco: Never Used  . Alcohol Use: 8.4 oz/week    14 Glasses of wine per week     Comment: "probably 2, 4oz glasses of wine/night"  . Drug Use: No  . Sexual Activity: Not Currently   Other Topics Concern  . Not on file   Social History Narrative   Lives in [place] with spouse.  No children.  Retired Education officer, museum.    Family History  Problem Relation Age of Onset  . Stroke Father   . Heart attack Father 12  . Macular degeneration Mother   . Transient ischemic attack Mother     multiple  . Hypertension Brother     ROS-  All systems are reviewed and are negative except as outlined in the HPI above  Physical Exam: Filed Vitals:   01/11/14 1537  BP: 119/74  Pulse: 93   Height: 5\' 4"  (1.626 m)  Weight: 154 lb 9.6 oz (  70.126 kg)  SpO2: 92%    GEN- The patient is well appearing, alert and oriented x 3 today.   Head- normocephalic, atraumatic Eyes-  Sclera clear, conjunctiva pink Ears- hearing intact Oropharynx- clear Neck- supple, no JVP Lymph- no cervical lymphadenopathy Lungs- Clear to ausculation bilaterally, normal work of breathing Heart- regular rate and rhythm, no murmurs, rubs or gallops, PMI not laterally displaced GI- soft, NT, ND, + BS Extremities- no clubbing, cyanosis, or edema MS- no significant deformity or atrophy Skin- no rash or lesion Psych- euthymic mood, full affect Neuro- strength and sensation are   ekg today reveals atrial pacing, stable Qtc, first degree AV block  Assessment and Plan:  1. pesistent afib/ atypical atrial flutter Now back in sinus and doing well Continue tikosyn Follow bmet, Mg  2. Tachycardia/ bradycardia syndrome Normal pacemaker function See Pace Art report No changes today  3. Valvular heart disease Stable No changes today  Continue current regimen Follow-up with Roderic Palau in 3 months in the afib clinic

## 2014-01-11 NOTE — Patient Instructions (Addendum)
Your physician recommends that you schedule a follow-up appointment in: 3 months with Dr Aggie Cosier, NP  Your physician recommends that you return for lab work :BMP/MAG

## 2014-01-12 LAB — CBC WITH DIFFERENTIAL/PLATELET
BASOS PCT: 0.6 % (ref 0.0–3.0)
Basophils Absolute: 0 10*3/uL (ref 0.0–0.1)
EOS PCT: 2.1 % (ref 0.0–5.0)
Eosinophils Absolute: 0.2 10*3/uL (ref 0.0–0.7)
HCT: 43.1 % (ref 36.0–46.0)
HEMOGLOBIN: 14.4 g/dL (ref 12.0–15.0)
LYMPHS PCT: 28.1 % (ref 12.0–46.0)
Lymphs Abs: 2.2 10*3/uL (ref 0.7–4.0)
MCHC: 33.5 g/dL (ref 30.0–36.0)
MCV: 85.6 fl (ref 78.0–100.0)
Monocytes Absolute: 0.6 10*3/uL (ref 0.1–1.0)
Monocytes Relative: 8 % (ref 3.0–12.0)
Neutro Abs: 4.9 10*3/uL (ref 1.4–7.7)
Neutrophils Relative %: 61.2 % (ref 43.0–77.0)
Platelets: 178 10*3/uL (ref 150.0–400.0)
RBC: 5.04 Mil/uL (ref 3.87–5.11)
RDW: 14.1 % (ref 11.5–15.5)
WBC: 7.9 10*3/uL (ref 4.0–10.5)

## 2014-01-12 LAB — BASIC METABOLIC PANEL
BUN: 21 mg/dL (ref 6–23)
CALCIUM: 10.3 mg/dL (ref 8.4–10.5)
CHLORIDE: 106 meq/L (ref 96–112)
CO2: 27 mEq/L (ref 19–32)
Creatinine, Ser: 0.8 mg/dL (ref 0.4–1.2)
GFR: 75.03 mL/min (ref >60.00)
Glucose, Bld: 120 mg/dL — ABNORMAL HIGH (ref 70–99)
Potassium: 3.8 mEq/L (ref 3.5–5.1)
SODIUM: 139 meq/L (ref 135–145)

## 2014-01-16 LAB — MDC_IDC_ENUM_SESS_TYPE_INCLINIC
Battery Impedance: 184 Ohm
Battery Remaining Longevity: 128 mo
Battery Voltage: 2.77 V
Brady Statistic AP VP Percent: 0 %
Brady Statistic AS VP Percent: 0 %
Lead Channel Impedance Value: 493 Ohm
Lead Channel Pacing Threshold Amplitude: 0.5 V
Lead Channel Sensing Intrinsic Amplitude: 2 mV
Lead Channel Setting Pacing Amplitude: 2 V
Lead Channel Setting Pacing Amplitude: 2.5 V
Lead Channel Setting Pacing Pulse Width: 0.76 ms
MDC IDC MSMT LEADCHNL RA PACING THRESHOLD PULSEWIDTH: 0.4 ms
MDC IDC MSMT LEADCHNL RV IMPEDANCE VALUE: 625 Ohm
MDC IDC MSMT LEADCHNL RV PACING THRESHOLD AMPLITUDE: 0.75 V
MDC IDC MSMT LEADCHNL RV PACING THRESHOLD PULSEWIDTH: 0.76 ms
MDC IDC MSMT LEADCHNL RV SENSING INTR AMPL: 11.2 mV
MDC IDC SESS DTM: 20150701194308
MDC IDC SET LEADCHNL RV SENSING SENSITIVITY: 5.6 mV
MDC IDC STAT BRADY AP VS PERCENT: 43 %
MDC IDC STAT BRADY AS VS PERCENT: 56 %

## 2014-01-19 ENCOUNTER — Encounter: Payer: Self-pay | Admitting: General Surgery

## 2014-01-30 ENCOUNTER — Ambulatory Visit (INDEPENDENT_AMBULATORY_CARE_PROVIDER_SITE_OTHER): Payer: Medicare Other | Admitting: Interventional Cardiology

## 2014-01-30 ENCOUNTER — Encounter: Payer: Self-pay | Admitting: Interventional Cardiology

## 2014-01-30 ENCOUNTER — Ambulatory Visit (INDEPENDENT_AMBULATORY_CARE_PROVIDER_SITE_OTHER): Payer: Medicare Other | Admitting: Pharmacist

## 2014-01-30 VITALS — BP 111/65 | HR 85 | Ht 64.0 in | Wt 156.0 lb

## 2014-01-30 DIAGNOSIS — I4891 Unspecified atrial fibrillation: Secondary | ICD-10-CM

## 2014-01-30 DIAGNOSIS — Z9889 Other specified postprocedural states: Secondary | ICD-10-CM

## 2014-01-30 DIAGNOSIS — I483 Typical atrial flutter: Secondary | ICD-10-CM

## 2014-01-30 DIAGNOSIS — I495 Sick sinus syndrome: Secondary | ICD-10-CM

## 2014-01-30 DIAGNOSIS — I5032 Chronic diastolic (congestive) heart failure: Secondary | ICD-10-CM

## 2014-01-30 DIAGNOSIS — Z79899 Other long term (current) drug therapy: Secondary | ICD-10-CM

## 2014-01-30 DIAGNOSIS — I4892 Unspecified atrial flutter: Secondary | ICD-10-CM

## 2014-01-30 LAB — POCT INR: INR: 2.6

## 2014-01-30 NOTE — Patient Instructions (Signed)
Your physician recommends that you continue on your current medications as directed. Please refer to the Current Medication list given to you today.  Your physician wants you to follow-up in: 9-12 months with Dr.Smith You will receive a reminder letter in the mail two months in advance. If you don't receive a letter, please call our office to schedule the follow-up appointment.  

## 2014-01-30 NOTE — Progress Notes (Signed)
Patient ID: Jo Hughes, female   DOB: 12/24/1940, 73 y.o.   MRN: [id number]    [address], [address], [place]  [address] Phone: [phone] Fax:  [phone]  Date:  01/30/2014   ID:  Jo Hughes, DOB 1940-10-24, MRN [id number]  PCP:  Irven Shelling, MD   ASSESSMENT:  1. Atrial fibrillation/flutter, now status post ablation and maintaining sinus rhythm on Tikosyn 2. Tikosyn therapy without complications 3. Chronic diastolic heart failure, stable 4. Chronic anticoagulation therapy  PLAN:  1. Clinical followup with me if symptoms or changes and overall clinical condition. 2. Followup with Dr. Rayann Heman in 6 months and Dr. Tamala Julian in 9-12 month 3. Continue Coumadin clinic   SUBJECTIVE: Jo Hughes is a 73 y.o. female who is now doing great after ablation and the addition of Tikosyn by Dr. Rayann Heman. Her exertional tolerance has significantly improved. Dyspnea has improved. She has not had palpitations or syncope. She denies neurological complaints. No medication side effects. No peripheral edema or bleeding.   Wt Readings from Last 3 Encounters:  01/30/14 156 lb (70.761 kg)  01/11/14 154 lb 9.6 oz (70.126 kg)  12/13/13 156 lb (70.761 kg)     Past Medical History  Diagnosis Date  . H/O mitral valve repair [facility] , [place].  . H/O tricuspid valve repair   . Hx of acquired endocarditis 1970; 1986    Prior to mitral valve repair   . Hyperlipidemia     cardiac cath clean coronary arties in the past.  . Glaucoma   . Macular degeneration     early , [facility]  . Hypothyroid   . Atrial fibrillation     persistent  . Symptomatic bradycardia     s/p PPM  . Chronic diastolic CHF (congestive heart failure)   . Anxiety   . Thyroid nodule   . Long term (current) use of anticoagulants     No bleeding  . Kidney stone 10/2013    "pass it"  . 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 ALPRAZolam (XANAX) 0.5 MG tablet Take 0.5 mg by mouth 4 (four) times daily. For anxiety      . Calcium Carbonate-Vitamin D (CALCIUM 600 + D PO) Take 1 tablet by mouth daily.       . cycloSPORINE (RESTASIS) 0.05 % ophthalmic emulsion Place 1 drop into both eyes 2 (two) times daily.       Marland Kitchen dofetilide (TIKOSYN) 500 MCG capsule Take 1 capsule (500 mcg total) by mouth 2 (two) times daily.  60 capsule  11  . dorzolamide-timolol (COSOPT) 22.3-6.8 MG/ML ophthalmic solution Place 1 drop into both eyes 2 (two) times daily.      . furosemide (LASIX) 40 MG tablet Take 60 mg by mouth daily.      . hydrocortisone 1 % ointment Apply 1 application topically 2 (two) times daily as needed (for rash).      Marland Kitchen latanoprost (XALATAN) 0.005 % ophthalmic solution Place 1 drop into both eyes at bedtime.       Marland Kitchen levothyroxine (SYNTHROID, LEVOTHROID) 88 MCG tablet Take 88 mcg by mouth daily.      . metoprolol succinate (TOPROL-XL) 25 MG 24 hr tablet Take 25 mg by mouth 2 (two) times daily.      . Multiple Vitamins-Minerals (ICAPS) CAPS Take 1 capsule by mouth  2 (two) times daily.       . Omega-3 Fatty Acids (FISH OIL) 1000 MG CAPS Take 1,000 mg by mouth 4 (four) times daily - after meals and at bedtime.      Marland Kitchen PARoxetine (PAXIL-CR) 37.5 MG 24 hr tablet Take 37.5 mg by mouth daily.       . potassium chloride SA (K-DUR,KLOR-CON) 20 MEQ tablet Take 1.5 tablets (30 mEq total) by mouth daily.  45 tablet  1  . rosuvastatin (CRESTOR) 40 MG tablet Take 40 mg by mouth daily.      . traMADol (ULTRAM) 50 MG tablet Take 50 mg by mouth at bedtime as needed for moderate pain. For pain      . warfarin (COUMADIN) 5 MG tablet Take 2.5-5 mg by mouth daily. Take 1 tablet on Monday, Wednesday and Friday.  Take 0.5 tablet on Sunday, Tuesday, Thursday and Saturday.      Marland Kitchen ZIOPTAN 0.0015 % SOLN Place 1 drop into both eyes at bedtime.       Marland Kitchen zolmitriptan (ZOMIG) 5 MG tablet Take 5 mg by mouth daily as needed  for migraine.       Marland Kitchen zolpidem (AMBIEN) 10 MG tablet Take 10 mg by mouth at bedtime as needed. For sleep       No current facility-administered medications for this visit.    Allergies:    Allergies  Allergen Reactions  . Trazodone And Nefazodone Other (See Comments)    Weakness.  . Citalopram Hydrobromide Other (See Comments)    Headache and insomnia    Social History:  The patient  reports that she quit smoking about 28 years ago. Her smoking use included Cigarettes. She has a 24 pack-year smoking history. She has never used smokeless tobacco. She reports that she drinks about 8.4 ounces of alcohol per week. She reports that she does not use illicit drugs.   ROS:  Please see the history of present illness.   Denies syncope, orthopnea, abdominal swelling, claudication, headache, and muscle cramps.   All other systems reviewed and negative.   OBJECTIVE: VS:  BP 111/65  Pulse 85  Ht 5\' 4"  (1.626 m)  Wt 156 lb (70.761 kg)  BMI 26.76 kg/m2 Well nourished, well developed, in no acute distress, stated age appropriate HEENT: normal Neck: JVD flat. Carotid bruit absent  Cardiac:  normal S1, S2; RRR; no murmur Lungs:  clear to auscultation bilaterally, no wheezing, rhonchi or rales Abd: soft, nontender, no hepatomegaly Ext: Edema absent. Pulses 2+ Skin: warm and dry Neuro:  CNs 2-12 intact, no focal abnormalities noted  EKG:  Not repeated       Signed, Illene Labrador III, MD 01/30/2014 2:32 PM

## 2014-01-31 ENCOUNTER — Encounter: Payer: Self-pay | Admitting: Internal Medicine

## 2014-03-05 ENCOUNTER — Other Ambulatory Visit: Payer: Self-pay | Admitting: Interventional Cardiology

## 2014-03-05 ENCOUNTER — Other Ambulatory Visit: Payer: Self-pay | Admitting: Internal Medicine

## 2014-03-13 ENCOUNTER — Ambulatory Visit (INDEPENDENT_AMBULATORY_CARE_PROVIDER_SITE_OTHER): Payer: Medicare Other | Admitting: Pharmacist

## 2014-03-13 DIAGNOSIS — I4891 Unspecified atrial fibrillation: Secondary | ICD-10-CM

## 2014-03-13 LAB — POCT INR: INR: 2.2

## 2014-04-04 ENCOUNTER — Encounter: Payer: Self-pay | Admitting: Internal Medicine

## 2014-04-12 ENCOUNTER — Encounter: Payer: Medicare Other | Admitting: Internal Medicine

## 2014-04-14 ENCOUNTER — Ambulatory Visit (INDEPENDENT_AMBULATORY_CARE_PROVIDER_SITE_OTHER): Payer: Medicare Other

## 2014-04-14 DIAGNOSIS — I4891 Unspecified atrial fibrillation: Secondary | ICD-10-CM

## 2014-04-14 LAB — POCT INR: INR: 3.5

## 2014-04-23 ENCOUNTER — Other Ambulatory Visit: Payer: Self-pay | Admitting: Interventional Cardiology

## 2014-05-01 ENCOUNTER — Other Ambulatory Visit: Payer: Self-pay | Admitting: Internal Medicine

## 2014-05-03 ENCOUNTER — Telehealth: Payer: Self-pay | Admitting: Internal Medicine

## 2014-05-03 ENCOUNTER — Encounter: Payer: Self-pay | Admitting: Internal Medicine

## 2014-05-03 ENCOUNTER — Ambulatory Visit (INDEPENDENT_AMBULATORY_CARE_PROVIDER_SITE_OTHER): Payer: Medicare Other | Admitting: Internal Medicine

## 2014-05-03 ENCOUNTER — Ambulatory Visit (INDEPENDENT_AMBULATORY_CARE_PROVIDER_SITE_OTHER): Payer: Medicare Other | Admitting: Pharmacist

## 2014-05-03 VITALS — BP 102/68 | HR 88 | Ht 64.0 in | Wt 161.8 lb

## 2014-05-03 DIAGNOSIS — I484 Atypical atrial flutter: Secondary | ICD-10-CM

## 2014-05-03 DIAGNOSIS — I481 Persistent atrial fibrillation: Secondary | ICD-10-CM

## 2014-05-03 DIAGNOSIS — I4891 Unspecified atrial fibrillation: Secondary | ICD-10-CM

## 2014-05-03 DIAGNOSIS — Z950 Presence of cardiac pacemaker: Secondary | ICD-10-CM

## 2014-05-03 DIAGNOSIS — I4819 Other persistent atrial fibrillation: Secondary | ICD-10-CM

## 2014-05-03 DIAGNOSIS — R001 Bradycardia, unspecified: Secondary | ICD-10-CM

## 2014-05-03 LAB — MDC_IDC_ENUM_SESS_TYPE_INCLINIC
Battery Impedance: 184 Ohm
Battery Remaining Longevity: 127 mo
Battery Voltage: 2.79 V
Brady Statistic AP VP Percent: 0 %
Brady Statistic AP VS Percent: 53 %
Brady Statistic AS VP Percent: 0 %
Brady Statistic AS VS Percent: 47 %
Date Time Interrogation Session: 20151021101356
Lead Channel Impedance Value: 526 Ohm
Lead Channel Impedance Value: 623 Ohm
Lead Channel Pacing Threshold Amplitude: 0.5 V
Lead Channel Pacing Threshold Amplitude: 1 V
Lead Channel Pacing Threshold Pulse Width: 0.4 ms
Lead Channel Pacing Threshold Pulse Width: 0.76 ms
Lead Channel Sensing Intrinsic Amplitude: 15.67 mV
Lead Channel Sensing Intrinsic Amplitude: 2 mV
Lead Channel Setting Pacing Amplitude: 2 V
Lead Channel Setting Pacing Amplitude: 2.5 V
Lead Channel Setting Pacing Pulse Width: 0.76 ms
Lead Channel Setting Sensing Sensitivity: 5.6 mV

## 2014-05-03 LAB — BASIC METABOLIC PANEL
BUN: 23 mg/dL (ref 6–23)
CHLORIDE: 107 meq/L (ref 96–112)
CO2: 26 mEq/L (ref 19–32)
Calcium: 10.4 mg/dL (ref 8.4–10.5)
Creatinine, Ser: 0.9 mg/dL (ref 0.4–1.2)
GFR: 67.15 mL/min (ref >60.00)
GLUCOSE: 90 mg/dL (ref 70–99)
POTASSIUM: 4.3 meq/L (ref 3.5–5.1)
SODIUM: 141 meq/L (ref 135–145)

## 2014-05-03 LAB — POCT INR: INR: 3.1

## 2014-05-03 LAB — MAGNESIUM: MAGNESIUM: 2.3 mg/dL (ref 1.5–2.5)

## 2014-05-03 MED ORDER — DOFETILIDE 250 MCG PO CAPS
ORAL_CAPSULE | ORAL | Status: DC
Start: 2014-05-03 — End: 2014-08-02

## 2014-05-03 MED ORDER — PAROXETINE HCL 20 MG PO TABS: 20.0000 mg | ORAL_TABLET | Freq: Every day | ORAL | Status: DC

## 2014-05-03 NOTE — Telephone Encounter (Signed)
New message    Office calling checking on the status of cardiac clearance.

## 2014-05-03 NOTE — Telephone Encounter (Signed)
Okay to hold Coumadin 5 days prior to procedure and restart at MD doing procedure discretion after per Dr Rayann Heman

## 2014-05-03 NOTE — Progress Notes (Signed)
PCP:  Irven Shelling, MD Primary Cardiologist:  Dr Tamala Julian  The patient presents today for routine electrophysiology followup.  Since recently starting tikosyn, the patient reports doing very well.  She is maintaining sinus rhythm as far as she can tell. Device interrogation reports less than .1% mode switches.  She reports less fatigue. Main complaint today is that she is sleeping poorly due to back/leg pain. She is pending evaluation with a myelogram. She should be low risk to stop coumadin 5 days prior to procedure. No bleeding issues with coumadin.   Today, she denies symptoms of chest pain, shortness of breath, orthopnea, PND, lower extremity edema, dizziness, presyncope, syncope, or neurologic sequela. Positive for back and leg pain which seems to be worse in a lying position. The patient feels that she is  tolerating medications without difficulties and is otherwise without complaint today.   Past Medical History  Diagnosis Date  . H/O mitral valve repair [facility] , [place].  . H/O tricuspid valve repair   . Hx of acquired endocarditis 1970; 1986    Prior to mitral valve repair   . Hyperlipidemia     cardiac cath clean coronary arties in the past.  . Glaucoma   . Macular degeneration     early , [place]  . Hypothyroid   . Atrial fibrillation     persistent  . Symptomatic bradycardia     s/p PPM  . Chronic diastolic CHF (congestive heart failure)   . Anxiety   . Thyroid nodule   . Long term (current) use of anticoagulants     No bleeding  . Kidney stone 10/2013    "pass it"  . 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 Mitral valve repair  05/2004    [facility]  . Cardioversion  ~ 2011; 09/18/2011    ?; Procedure: CARDIOVERSION;  Surgeon: Sinclair Grooms, MD;  Location: [place];  Service: Cardiovascular;  Laterality: N/A;  . Abdominal hysterectomy  1986  . Knee cartilage  surgery Left 02/2009    scope; Dr. Berenice Primas  . Lumbar laminectomy  1997  . Tricuspid valve surgery  05/2004    "repair; [facility]"  . Back surgery    . Cataract extraction w/ intraocular lens  implant, bilateral  ~ 2011  . Cardiac catheterization  X 3  . Cardioversion N/A 04/29/2013    Procedure: CARDIOVERSION;  Surgeon: Sinclair Grooms, MD;  Location: [facility] ENDOSCOPY;  Service: Cardiovascular;  Laterality: N/A;  . Tee without cardioversion N/A 11/07/2013    Procedure: TRANSESOPHAGEAL ECHOCARDIOGRAM (TEE);  Surgeon: Pixie Casino, MD;  Location: [facility] ENDOSCOPY;  Service: Cardiovascular;  Laterality: N/A;  . Atrial fibrillation ablation  11/08/13    [facility] by Dr Rayann Heman   . Insert / replace / remove pacemaker  01/13/2012    MDT Adapta L implanted by Dr Rayann Heman  . Dilation and curettage of uterus    . Tonsillectomy  1948  . Breast biopsy Left X 2    "benign"    Current Outpatient Prescriptions  Medication Sig Dispense Refill  . ALPRAZolam (XANAX) 0.5 MG tablet Take 0.5 mg by mouth 2 (two) times daily as needed for anxiety. For anxiety      . Calcium Carbonate-Vitamin D (CALCIUM 600 + D PO) Take 1 tablet by mouth daily.       . celecoxib (CELEBREX) 200  MG capsule Take 200 mg up to twice daily      . cycloSPORINE (RESTASIS) 0.05 % ophthalmic emulsion Place 1 drop into both eyes 2 (two) times daily.       . dorzolamide-timolol (COSOPT) 22.3-6.8 MG/ML ophthalmic solution Place 1 drop into both eyes 2 (two) times daily.      . furosemide (LASIX) 40 MG tablet TAKE 1 & 1/2 TABLETS ONCE DAILY.  135 tablet  3  . hydrocortisone 1 % ointment Apply 1 application topically 2 (two) times daily as needed (for rash).      Marland Kitchen latanoprost (XALATAN) 0.005 % ophthalmic solution Place 1 drop into both eyes at bedtime.       Marland Kitchen levothyroxine (SYNTHROID, LEVOTHROID) 88 MCG tablet Take 88 mcg by mouth daily.      . metoprolol succinate (TOPROL-XL) 25 MG 24 hr tablet TAKE 1 TABLET TWICE DAILY.      . Multiple  Vitamins-Minerals (ICAPS) CAPS Take 1 capsule by mouth 2 (two) times daily.       . Omega-3 Fatty Acids (FISH OIL) 1000 MG CAPS Take 1,000 mg by mouth 4 (four) times daily - after meals and at bedtime.      . potassium chloride SA (K-DUR,KLOR-CON) 20 MEQ tablet TAKE 1&1/2 TABLETS ONCE DAILY.  45 tablet  11  . rosuvastatin (CRESTOR) 40 MG tablet Take 40 mg by mouth daily.      . traMADol (ULTRAM) 50 MG tablet Take 50 mg by mouth at bedtime as needed for moderate pain. For pain      . warfarin (COUMADIN) 5 MG tablet Take 2.5-5 mg by mouth daily. Take 1 tablet on Monday, Wednesday and Friday.  Take 0.5 tablet on Sunday, Tuesday, Thursday and Saturday.      Marland Kitchen ZIOPTAN 0.0015 % SOLN Place 1 drop into both eyes at bedtime.       Marland Kitchen zolmitriptan (ZOMIG) 5 MG tablet Take 5 mg by mouth daily as needed for migraine.       Marland Kitchen zolpidem (AMBIEN) 10 MG tablet Take 10 mg by mouth at bedtime as needed. For sleep      . dofetilide (TIKOSYN) 250 MCG capsule Take 1 1/2 tablets by mouth twice daily  270 capsule  3  . PARoxetine (PAXIL) 20 MG tablet Take 1 tablet (20 mg total) by mouth daily.  90 tablet  3   No current facility-administered medications for this visit.    Allergies  Allergen Reactions  . Trazodone And Nefazodone Other (See Comments)    Weakness.  . Citalopram Hydrobromide Other (See Comments)    Headache and insomnia    History   Social History  . Marital Status: Married    Spouse Name: N/A    Number of Children: N/A  . Years of Education: N/A   Occupational History  . Not on file.   Social History Main Topics  . Smoking status: Former Smoker -- 1.00 packs/day for 24 years    Types: Cigarettes    Quit date: 07/14/1985  . Smokeless tobacco: Never Used  . Alcohol Use: 8.4 oz/week    14 Glasses of wine per week     Comment: "probably 2, 4oz glasses of wine/night"  . Drug Use: No  . Sexual Activity: Not Currently   Other Topics Concern  . Not on file   Social History Narrative    Lives in [place] with spouse.  No children.  Retired Education officer, [employer].    Family History  Problem Relation Age  of Onset  . Stroke Father   . Heart attack Father 7  . Macular degeneration Mother   . Transient ischemic attack Mother     multiple  . Hypertension Brother     ROS-  All systems are reviewed and are negative except as outlined in the HPI above  Physical Exam: Filed Vitals:   05/03/14 0952  BP: 102/68  Pulse: 88  Height: 5\' 4"  (1.626 m)  Weight: 161 lb 12.8 oz (73.392 kg)    GEN- The patient is well appearing, alert and oriented x 3 today.   Head- normocephalic, atraumatic Eyes-  Sclera clear, conjunctiva pink Ears- hearing intact Oropharynx- clear Neck- supple, no JVP Lymph- no cervical lymphadenopathy Lungs- Clear to ausculation bilaterally, normal work of breathing Heart- regular rate and rhythm, no murmurs, rubs or gallops, PMI not laterally displaced GI- soft, NT, ND, + BS Extremities- no clubbing, cyanosis, or edema MS- no significant deformity or atrophy Skin- no rash or lesion Psych- euthymic mood, full affect Neuro- strength and sensation are   ekg today reveals Sr with 1st degree AV block with QTc of 537 ms.  Assessment and Plan:  1. Pesistent afib/ atypical atrial flutter Appears to be doing will s/p ablation / Tikosyn.   QTc, however, is prolonged and will  require change in meds. Reduce Tikosyn to 375 mg bid and Paxil to 20 mg a day.  Return one week with EKG reviewed with PharmD. Bmet/mag today. Coumadin clinic today.  2. Tachycardia/ bradycardia syndrome Normal pacemaker function See Pace Art report No changes today  3. Valvular heart disease Stable No changes today  4.Back/leg pain-   Pending myelogram- recommendation regarding anticoagulation requested from patient. May stop coumadin 5 days prior to procedure and resume after procedure when deemed safe.  5. F/u afib clinic in 3 months.  I have seen, examined the patient,  and reviewed the above assessment and plan with Roderic Palau NP.  Changes to above are made where necessary.  Though she has done very well with tikosyn, her QT is more pronounced today.  I have therefore reduced her dose.  She will return in 1 week for repeat ekg.  Co Sign: Thompson Grayer, MD 05/04/2014 10:45 PM

## 2014-05-03 NOTE — Telephone Encounter (Signed)
Faxed to [facility] [phone]

## 2014-05-03 NOTE — Patient Instructions (Addendum)
Your physician recommends that you schedule a follow-up appointment in: 1 week with Sindy Guadeloupe D for Tikosyn on a day Dr. Rayann Heman is here  Your physician recommends that you schedule a follow-up appointment in: 3 months with Roderic Palau, NP in [facility] at the hospital  Your physician recommends that you return for lab work today BMP/MAG  Your physician has recommended you make the following change in your medication:  1) Decrease Tikosyn to 386mcg twice daily 2) Decrease Paxil to 20 mg daily  Your physician wants you to follow-up in: 12 months with Dr Vallery Ridge will receive a reminder letter in the mail two months in advance. If you don't receive a letter, please call our office to schedule the follow-up appointment.  Remote monitoring is used to monitor your Pacemaker of ICD from home. This monitoring reduces the number of office visits required to check your device to one time per year. It allows [place] to keep an eye on the functioning of your device to ensure it is working properly. You are scheduled for a device check from home on 08/07/14. You may send your transmission at any time that day. If you have a wireless device, the transmission will be sent automatically. After your physician reviews your transmission, you will receive a postcard with your next transmission date.

## 2014-05-04 ENCOUNTER — Other Ambulatory Visit: Payer: Self-pay | Admitting: Anesthesiology

## 2014-05-04 DIAGNOSIS — M545 Low back pain: Secondary | ICD-10-CM

## 2014-05-10 ENCOUNTER — Ambulatory Visit (INDEPENDENT_AMBULATORY_CARE_PROVIDER_SITE_OTHER): Payer: Medicare Other | Admitting: Pharmacist

## 2014-05-10 DIAGNOSIS — I4819 Other persistent atrial fibrillation: Secondary | ICD-10-CM

## 2014-05-10 DIAGNOSIS — I481 Persistent atrial fibrillation: Secondary | ICD-10-CM

## 2014-05-10 NOTE — Progress Notes (Signed)
HPI  [name] is a 73 yo F seen today for Dr. Rayann Hughes for follow up after Tikosyn dose change.  She was seen by him last week and noted to have prolonged QTc.  She was on Tikosyn 562mcg BID.  He decreased her dose to 324mcg BID and decreased her Paxil dose in hopes of lowering QTc.  She is doing well today.  She states she feels no different with decreased dose of Tikosyn; no increase in SOB, fatigue, etc.    EKG today showed normal sinus with QTc 494 msec.  Current Outpatient Prescriptions  Medication Sig Dispense Refill  . ALPRAZolam (XANAX) 0.5 MG tablet Take 0.5 mg by mouth 2 (two) times daily as needed for anxiety. For anxiety      . Calcium Carbonate-Vitamin D (CALCIUM 600 + D PO) Take 1 tablet by mouth daily.       . celecoxib (CELEBREX) 200 MG capsule Take 200 mg up to twice daily      . cycloSPORINE (RESTASIS) 0.05 % ophthalmic emulsion Place 1 drop into both eyes 2 (two) times daily.       Marland Kitchen dofetilide (TIKOSYN) 250 MCG capsule Take 1 1/2 tablets by mouth twice daily  270 capsule  3  . dorzolamide-timolol (COSOPT) 22.3-6.8 MG/ML ophthalmic solution Place 1 drop into both eyes 2 (two) times daily.      . furosemide (LASIX) 40 MG tablet TAKE 1 & 1/2 TABLETS ONCE DAILY.  135 tablet  3  . hydrocortisone 1 % ointment Apply 1 application topically 2 (two) times daily as needed (for rash).      Marland Kitchen latanoprost (XALATAN) 0.005 % ophthalmic solution Place 1 drop into both eyes at bedtime.       Marland Kitchen levothyroxine (SYNTHROID, LEVOTHROID) 88 MCG tablet Take 88 mcg by mouth daily.      . metoprolol succinate (TOPROL-XL) 25 MG 24 hr tablet TAKE 1 TABLET TWICE DAILY.      . Multiple Vitamins-Minerals (ICAPS) CAPS Take 1 capsule by mouth 2 (two) times daily.       . Omega-3 Fatty Acids (FISH OIL) 1000 MG CAPS Take 1,000 mg by mouth 4 (four) times daily - after meals and at bedtime.      Marland Kitchen PARoxetine (PAXIL) 20 MG tablet Take 1 tablet (20 mg total) by mouth daily.  90 tablet  3  . potassium chloride SA  (K-DUR,KLOR-CON) 20 MEQ tablet TAKE 1&1/2 TABLETS ONCE DAILY.  45 tablet  11  . rosuvastatin (CRESTOR) 40 MG tablet Take 40 mg by mouth daily.      . traMADol (ULTRAM) 50 MG tablet Take 50 mg by mouth at bedtime as needed for moderate pain. For pain      . warfarin (COUMADIN) 5 MG tablet Take 2.5-5 mg by mouth daily. Take 1 tablet on Monday, Wednesday and Friday.  Take 0.5 tablet on Sunday, Tuesday, Thursday and Saturday.      Marland Kitchen ZIOPTAN 0.0015 % SOLN Place 1 drop into both eyes at bedtime.       Marland Kitchen zolmitriptan (ZOMIG) 5 MG tablet Take 5 mg by mouth daily as needed for migraine.       Marland Kitchen zolpidem (AMBIEN) 10 MG tablet Take 10 mg by mouth at bedtime as needed. For sleep       No current facility-administered medications for this visit.   Assessment/Plan  1.  Atrial Fibrillation- discussed with Dr. Rayann Hughes.  Pt's QTc much improved from last week.  Now < 519msec.  Labs last  week at goal.  Will continue Tikosyn 336mcg BID and follow up in Afib clinic in 3 months.

## 2014-05-15 ENCOUNTER — Ambulatory Visit (INDEPENDENT_AMBULATORY_CARE_PROVIDER_SITE_OTHER): Payer: Medicare Other | Admitting: Pharmacist

## 2014-05-15 DIAGNOSIS — I4891 Unspecified atrial fibrillation: Secondary | ICD-10-CM

## 2014-05-15 LAB — POCT INR: INR: 1

## 2014-05-16 ENCOUNTER — Ambulatory Visit
Admission: RE | Admit: 2014-05-16 | Discharge: 2014-05-16 | Disposition: A | Discharge status: Home / Self Care | Payer: Medicare Other | Source: Ambulatory Visit | Attending: Anesthesiology | Admitting: Anesthesiology

## 2014-05-16 DIAGNOSIS — M545 Low back pain: Secondary | ICD-10-CM

## 2014-05-16 MED ORDER — IOHEXOL 180 MG/ML  SOLN
15.0000 mL | Freq: Once | INTRAMUSCULAR | Status: AC | PRN
  Administered 2014-05-16: 15 mL via INTRATHECAL

## 2014-05-16 MED ORDER — DIAZEPAM 5 MG PO TABS
5.0000 mg | ORAL_TABLET | Freq: Once | ORAL | Status: AC
  Administered 2014-05-16: 5 mg via ORAL

## 2014-05-16 NOTE — Discharge Instructions (Signed)
Myelogram Discharge Instructions  1. Go home and rest quietly for the next 24 hours.  It is important to lie flat for the next 24 hours.  Get up only to go to the restroom.  You may lie in the bed or on a couch on your back, your stomach, your left side or your right side.  You may have one pillow under your head.  You may have pillows between your knees while you are on your side or under your knees while you are on your back.  2. DO NOT drive today.  Recline the seat as far back as it will go, while still wearing your seat belt, on the way home.  3. You may get up to go to the bathroom as needed.  You may sit up for 10 minutes to eat.  You may resume your normal diet and medications unless otherwise indicated.  Drink lots of extra fluids today and tomorrow.  4. The incidence of headache, nausea, or vomiting is about 5% (one in 20 patients).  If you develop a headache, lie flat and drink plenty of fluids until the headache goes away.  Caffeinated beverages may be helpful.  If you develop severe nausea and vomiting or a headache that does not go away with flat bed rest, call [phone].  5. You may resume normal activities after your 24 hours of bed rest is over; however, do not exert yourself strongly or do any heavy lifting tomorrow. If when you get up you have a headache when standing, go back to bed and force fluids for another 24 hours.  6. Call your physician for a follow-up appointment.  The results of your myelogram will be sent directly to your physician by the following day.  7. If you have any questions or if complications develop after you arrive home, please call [phone].  Discharge instructions have been explained to the patient.  The patient, or the person responsible for the patient, fully understands these instructions.      May resume COUMADIN  today.   May resume Paxil, Tramadol and Zomig on Nov. 4, 2015, after 11:00 am.

## 2014-05-16 NOTE — Progress Notes (Signed)
Pt states she has been off coumadin for the past 5 days. Her INR was 1.0. She has also been off paxil, tramadol and zomig for the past 2 days. Discharge instructions explained to pt and her husband.

## 2014-05-22 ENCOUNTER — Telehealth: Payer: Self-pay

## 2014-05-22 NOTE — Telephone Encounter (Signed)
Cardiac clearance MR fax box to fax to [facility] attn:Dr.Harkins fax # [phone]

## 2014-05-29 ENCOUNTER — Ambulatory Visit (INDEPENDENT_AMBULATORY_CARE_PROVIDER_SITE_OTHER): Payer: Medicare Other | Admitting: Pharmacist

## 2014-05-29 DIAGNOSIS — I4891 Unspecified atrial fibrillation: Secondary | ICD-10-CM

## 2014-05-29 LAB — POCT INR: INR: 2.2

## 2014-05-30 ENCOUNTER — Telehealth: Payer: Self-pay | Admitting: Internal Medicine

## 2014-05-30 NOTE — Telephone Encounter (Signed)
Received request from Nurse fax box:   To: [facility] Fax number: [phone]

## 2014-06-01 LAB — HM DIABETES EYE EXAM

## 2014-06-19 ENCOUNTER — Ambulatory Visit (INDEPENDENT_AMBULATORY_CARE_PROVIDER_SITE_OTHER): Payer: Medicare Other

## 2014-06-19 DIAGNOSIS — I4891 Unspecified atrial fibrillation: Secondary | ICD-10-CM

## 2014-06-19 LAB — POCT INR: INR: 1

## 2014-06-22 ENCOUNTER — Encounter (HOSPITAL_COMMUNITY): Payer: Self-pay | Admitting: Internal Medicine

## 2014-06-26 ENCOUNTER — Ambulatory Visit (INDEPENDENT_AMBULATORY_CARE_PROVIDER_SITE_OTHER): Payer: Medicare Other | Admitting: Pharmacist

## 2014-06-26 DIAGNOSIS — I4891 Unspecified atrial fibrillation: Secondary | ICD-10-CM

## 2014-06-26 LAB — POCT INR: INR: 1.7

## 2014-07-05 ENCOUNTER — Ambulatory Visit (INDEPENDENT_AMBULATORY_CARE_PROVIDER_SITE_OTHER): Payer: Medicare Other | Admitting: *Deleted

## 2014-07-05 DIAGNOSIS — I4891 Unspecified atrial fibrillation: Secondary | ICD-10-CM

## 2014-07-05 LAB — POCT INR: INR: 2.7

## 2014-07-19 ENCOUNTER — Ambulatory Visit (INDEPENDENT_AMBULATORY_CARE_PROVIDER_SITE_OTHER): Payer: Medicare Other | Admitting: Pharmacist

## 2014-07-19 ENCOUNTER — Encounter: Payer: Self-pay | Admitting: Interventional Cardiology

## 2014-07-19 DIAGNOSIS — I4891 Unspecified atrial fibrillation: Secondary | ICD-10-CM

## 2014-07-19 LAB — POCT INR: INR: 1.7

## 2014-07-27 ENCOUNTER — Encounter (HOSPITAL_COMMUNITY): Payer: Self-pay | Admitting: Cardiology

## 2014-08-02 ENCOUNTER — Encounter: Payer: Self-pay | Admitting: Nurse Practitioner

## 2014-08-02 ENCOUNTER — Ambulatory Visit (INDEPENDENT_AMBULATORY_CARE_PROVIDER_SITE_OTHER): Payer: Medicare Other | Admitting: Nurse Practitioner

## 2014-08-02 ENCOUNTER — Ambulatory Visit (INDEPENDENT_AMBULATORY_CARE_PROVIDER_SITE_OTHER): Payer: Medicare Other | Admitting: *Deleted

## 2014-08-02 VITALS — BP 110/74 | HR 90 | Ht 64.0 in | Wt 162.0 lb

## 2014-08-02 DIAGNOSIS — R001 Bradycardia, unspecified: Secondary | ICD-10-CM | POA: Diagnosis not present

## 2014-08-02 DIAGNOSIS — I4891 Unspecified atrial fibrillation: Secondary | ICD-10-CM

## 2014-08-02 DIAGNOSIS — I481 Persistent atrial fibrillation: Secondary | ICD-10-CM | POA: Diagnosis not present

## 2014-08-02 DIAGNOSIS — I4819 Other persistent atrial fibrillation: Secondary | ICD-10-CM

## 2014-08-02 LAB — MDC_IDC_ENUM_SESS_TYPE_INCLINIC
Battery Voltage: 2.77 V
Brady Statistic AP VP Percent: 0 %
Brady Statistic AP VS Percent: 56 %
Brady Statistic AS VP Percent: 0 %
Brady Statistic AS VS Percent: 44 %
Date Time Interrogation Session: 20160120122134
Lead Channel Impedance Value: 607 Ohm
Lead Channel Pacing Threshold Amplitude: 0.75 V
Lead Channel Pacing Threshold Amplitude: 0.75 V
Lead Channel Pacing Threshold Pulse Width: 0.4 ms
Lead Channel Sensing Intrinsic Amplitude: 15.67 mV
Lead Channel Setting Pacing Amplitude: 2.5 V
Lead Channel Setting Pacing Pulse Width: 0.76 ms
MDC IDC MSMT BATTERY IMPEDANCE: 208 Ohm
MDC IDC MSMT BATTERY REMAINING LONGEVITY: 123 mo
MDC IDC MSMT LEADCHNL RA IMPEDANCE VALUE: 541 Ohm
MDC IDC MSMT LEADCHNL RA SENSING INTR AMPL: 2 mV
MDC IDC MSMT LEADCHNL RV PACING THRESHOLD PULSEWIDTH: 0.76 ms
MDC IDC SET LEADCHNL RA PACING AMPLITUDE: 2 V
MDC IDC SET LEADCHNL RV SENSING SENSITIVITY: 5.6 mV

## 2014-08-02 LAB — BASIC METABOLIC PANEL
BUN: 19 mg/dL (ref 6–23)
CO2: 29 mEq/L (ref 19–32)
CREATININE: 0.83 mg/dL (ref 0.40–1.20)
Calcium: 10.4 mg/dL (ref 8.4–10.5)
Chloride: 105 mEq/L (ref 96–112)
GFR: 71.79 mL/min (ref >60.00)
Glucose, Bld: 101 mg/dL — ABNORMAL HIGH (ref 70–99)
POTASSIUM: 4.2 meq/L (ref 3.5–5.1)
SODIUM: 137 meq/L (ref 135–145)

## 2014-08-02 LAB — MAGNESIUM: Magnesium: 2.2 mg/dL (ref 1.5–2.5)

## 2014-08-02 LAB — POCT INR: INR: 2.2

## 2014-08-02 MED ORDER — DOFETILIDE 125 MCG PO CAPS: 125.0000 ug | ORAL_CAPSULE | Freq: Two times a day (BID) | ORAL | Status: DC

## 2014-08-02 MED ORDER — DOFETILIDE 250 MCG PO CAPS: 250.0000 ug | ORAL_CAPSULE | Freq: Two times a day (BID) | ORAL | Status: DC

## 2014-08-02 NOTE — Progress Notes (Signed)
Patient presents for device clinic pacemaker check.  No problems with shortness of breath, chest pain, palpitations, or syncope.  Device interrogated and found to be functioning normally.  No changes made today.  See PaceArt report for full details.  Plan: [facility] 11/01/14 ROV with JA in 12 months.  Ranee Gosselin, RN, BSN 08/02/2014 12:09 PM

## 2014-08-02 NOTE — Patient Instructions (Addendum)
Your physician recommends that you continue on your current medications as directed. Please refer to the Current Medication list given to you today.  Lab work today: BMET, Magnesium  Your physician recommends that you schedule a follow-up appointment in: 3 months with Dr. Rayann Heman.

## 2014-08-02 NOTE — Progress Notes (Signed)
PCP:  Irven Shelling, MD Primary Cardiologist:  Dr Tamala Julian  The patient presents today for routine electrophysiology followup.  Since recently starting tikosyn, the patient reports doing very well. Taking on regular basis. She is maintaining sinus rhythm as far as she can tell. Device interrogation reports 5.1% afib burden.  Reports no bleeding issues with warfarin. Pending labs-  INR bmet and mag today.   Today, she denies symptoms of chest pain, shortness of breath, orthopnea, PND, lower extremity edema, dizziness, presyncope, syncope, or neurologic sequela. Positive for back and leg pain which seems to be worse in a lying position. The patient feels that she is  tolerating medications without difficulties and is otherwise without complaint today.   Past Medical History  Diagnosis Date  . H/O mitral valve repair [facility] , [place].  . H/O tricuspid valve repair   . Hx of acquired endocarditis 1970; 1986    Prior to mitral valve repair   . Hyperlipidemia     cardiac cath clean coronary arties in the past.  . Glaucoma   . Macular degeneration     early , Hecker  . Hypothyroid   . Atrial fibrillation     persistent  . Symptomatic bradycardia     s/p PPM  . Chronic diastolic CHF (congestive heart failure)   . Anxiety   . Thyroid nodule   . Long term (current) use of anticoagulants     No bleeding  . Kidney stone 10/2013    "pass it"  . 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 Mitral valve repair  05/2004    [facility]  . Cardioversion  ~ 2011; 09/18/2011    ?; Procedure: CARDIOVERSION;  Surgeon: Sinclair Grooms, MD;  Location: [place];  Service: Cardiovascular;  Laterality: N/A;  . Abdominal hysterectomy  1986  . Knee cartilage surgery Left 02/2009    scope; Dr. Berenice Primas  . Lumbar laminectomy  1997  . Tricuspid valve surgery  05/2004    "repair; [facility]"  . Back  surgery    . Cataract extraction w/ intraocular lens  implant, bilateral  ~ 2011  . Cardiac catheterization  X 3  . Cardioversion N/A 04/29/2013    Procedure: CARDIOVERSION;  Surgeon: Sinclair Grooms, MD;  Location: [facility] ENDOSCOPY;  Service: Cardiovascular;  Laterality: N/A;  . Tee without cardioversion N/A 11/07/2013    Procedure: TRANSESOPHAGEAL ECHOCARDIOGRAM (TEE);  Surgeon: Pixie Casino, MD;  Location: [facility] ENDOSCOPY;  Service: Cardiovascular;  Laterality: N/A;  . Atrial fibrillation ablation  11/08/13    PVI by Dr Rayann Heman   . Insert / replace / remove pacemaker  01/13/2012    MDT Adapta L implanted by Dr Rayann Heman  . Dilation and curettage of uterus    . Tonsillectomy  1948  . Breast biopsy Left X 2    "benign"  . Permanent pacemaker insertion N/A 01/13/2012    Procedure: PERMANENT PACEMAKER INSERTION;  Surgeon: Thompson Grayer, MD;  Location: [facility] CATH LAB;  Service: Cardiovascular;  Laterality: N/A;  . Atrial fibrillation ablation N/A 11/08/2013    Procedure: ATRIAL FIBRILLATION ABLATION;  Surgeon: Coralyn Mark, MD;  Location: [place] CATH LAB;  Service: Cardiovascular;  Laterality: N/A;    Current Outpatient Prescriptions  Medication Sig Dispense Refill  . ALPRAZolam (XANAX) 0.5 MG tablet Take 0.5 mg by mouth 2 (two) times daily as needed  for anxiety. For anxiety    . Calcium Carbonate-Vitamin D (CALCIUM 600 + D PO) Take 1 tablet by mouth daily.     . celecoxib (CELEBREX) 200 MG capsule Take 200 mg up to twice daily    . cycloSPORINE (RESTASIS) 0.05 % ophthalmic emulsion Place 1 drop into both eyes 2 (two) times daily.     . dorzolamide-timolol (COSOPT) 22.3-6.8 MG/ML ophthalmic solution Place 1 drop into both eyes 2 (two) times daily.    . furosemide (LASIX) 40 MG tablet TAKE 1 & 1/2 TABLETS ONCE DAILY. (Patient taking differently: TAKE 1 & 1/2 TABLETS ONCE DAILY. (60 mg)) 135 tablet 3  . hydrocortisone 1 % ointment Apply 1 application topically 2 (two) times daily as needed (for rash).    Marland Kitchen  latanoprost (XALATAN) 0.005 % ophthalmic solution Place 1 drop into both eyes at bedtime.     Marland Kitchen levothyroxine (SYNTHROID, LEVOTHROID) 88 MCG tablet Take 88 mcg by mouth daily.    . metoprolol succinate (TOPROL-XL) 25 MG 24 hr tablet TAKE 1 TABLET TWICE DAILY.    . Multiple Vitamins-Minerals (ICAPS) CAPS Take 1 capsule by mouth 2 (two) times daily.     . Omega-3 Fatty Acids (FISH OIL) 1000 MG CAPS Take 1,000 mg by mouth 4 (four) times daily - after meals and at bedtime.    Marland Kitchen PARoxetine (PAXIL) 20 MG tablet Take 1 tablet (20 mg total) by mouth daily. 90 tablet 3  . potassium chloride SA (K-DUR,KLOR-CON) 20 MEQ tablet TAKE 1&1/2 TABLETS ONCE DAILY. (Patient taking differently: TAKE 1&1/2 TABLETS ONCE DAILY. (30 meq)) 45 tablet 11  . rosuvastatin (CRESTOR) 40 MG tablet Take 40 mg by mouth daily.    . traMADol (ULTRAM) 50 MG tablet Take 50 mg by mouth at bedtime as needed for moderate pain. For pain    . warfarin (COUMADIN) 5 MG tablet Take 2.5-5 mg by mouth daily. Take 1 tablet on Monday, Wednesday and Friday.  Take 0.5 tablet on Sunday, Tuesday, Thursday and Saturday.    Marland Kitchen ZIOPTAN 0.0015 % SOLN Place 1 drop into both eyes at bedtime.     Marland Kitchen zolmitriptan (ZOMIG) 5 MG tablet Take 5 mg by mouth daily as needed for migraine.     Marland Kitchen zolpidem (AMBIEN) 10 MG tablet Take 10 mg by mouth at bedtime as needed. For sleep    . dofetilide (TIKOSYN) 125 MCG capsule Take 1 capsule (125 mcg total) by mouth 2 (two) times daily. 60 capsule 3  . dofetilide (TIKOSYN) 250 MCG capsule Take 1 capsule (250 mcg total) by mouth 2 (two) times daily. 60 capsule 3   No current facility-administered medications for this visit.    Allergies  Allergen Reactions  . Citalopram Hydrobromide Other (See Comments)    Headache and insomnia  . Trazodone And Nefazodone Other (See Comments)    Weakness.    History   Social History  . Marital Status: Married    Spouse Name: N/A    Number of Children: N/A  . Years of Education: N/A    Occupational History  . Not on file.   Social History Main Topics  . Smoking status: Former Smoker -- 1.00 packs/day for 24 years    Types: Cigarettes    Quit date: 07/14/1985  . Smokeless tobacco: Never Used  . Alcohol Use: 8.4 oz/week    14 Glasses of wine per week     Comment: "probably 2, 4oz glasses of wine/night"  . Drug Use: No  . Sexual Activity:  Not Currently   Other Topics Concern  . Not on file   Social History Narrative   Lives in [place] with spouse.  No children.  Retired Education officer, museum.    Family History  Problem Relation Age of Onset  . Stroke Father   . Heart attack Father 53  . Macular degeneration Mother   . Transient ischemic attack Mother     multiple  . Hypertension Brother     ROS-  All systems are reviewed and are negative except as outlined in the HPI above  Physical Exam: Filed Vitals:   08/02/14 1147  BP: 110/74  Pulse: 90  Height: 5\' 4"  (1.626 m)  Weight: 162 lb (73.483 kg)    GEN- The patient is well appearing, alert and oriented x 3 today.   Head- normocephalic, atraumatic Eyes-  Sclera clear, conjunctiva pink Ears- hearing intact Oropharynx- clear Neck- supple, no JVP Lymph- no cervical lymphadenopathy Lungs- Clear to ausculation bilaterally, normal work of breathing Heart- regular rate and rhythm, no murmurs, rubs or gallops, PMI not laterally displaced GI- soft, NT, ND, + BS Extremities- no clubbing, cyanosis, or edema MS- no significant deformity or atrophy Skin- no rash or lesion Psych- euthymic mood, full affect Neuro- strength and sensation are   ekg today reveals SR 90 bpm, with first degree AV block, with QTc calculated by Dr. Rayann Heman at 491 ms, IRBBB, RVH, unchanged from previous EKG. Pacer interrogation reviewed, normal functioning device with afib burden 5.1%  Assessment and Plan:  1. Pesistent afib/ atypical atrial flutter Appears to be doing will s/p ablation / Tikosyn.  Tikosyn to 375 mg bid and Paxil  to 20 mg a day.  Bmet/mag today. Coumadin clinic today.  2. Tachycardia/ bradycardia syndrome Normal pacemaker function See Pace Art report No changes today  3. Valvular heart disease Stable No changes today  4. F/u Dr. Rayann Heman in 3 months.   Co SignRoderic Palau, MD 08/02/2014 6:21 PM

## 2014-08-08 ENCOUNTER — Encounter: Payer: Self-pay | Admitting: Cardiology

## 2014-08-23 ENCOUNTER — Ambulatory Visit (INDEPENDENT_AMBULATORY_CARE_PROVIDER_SITE_OTHER): Payer: Medicare Other | Admitting: *Deleted

## 2014-08-23 DIAGNOSIS — I4891 Unspecified atrial fibrillation: Secondary | ICD-10-CM

## 2014-08-23 LAB — POCT INR: INR: 2

## 2014-09-04 ENCOUNTER — Encounter: Payer: Self-pay | Admitting: Internal Medicine

## 2014-09-06 ENCOUNTER — Telehealth: Payer: Self-pay | Admitting: Interventional Cardiology

## 2014-09-06 NOTE — Telephone Encounter (Signed)
Pt c/o swelling: STAT is pt has developed SOB within 24 hours  1. How long have you been experiencing swelling? A month  2. Where is the swelling located? Left knee to ankle  3.  Are you currently taking a "fluid pill"? Yes  4.  Are you currently SOB? At times  5.  Have you traveled recently? No  Pt calling stating that she is experiencing swelling as well as pain in her left leg, please call back and advise.

## 2014-09-06 NOTE — Telephone Encounter (Signed)
Called patient about her swelling in her left leg. Patient describes swelling going from her left knee cap to ankle and foot. Patient stated that her left leg is deep red and blue color, from 5-8 pain on the pain scale, warm to touch, pitting edema, and SOB at times. This has been going on for almost a month. Patient denies any numbness or tingling. Patient is on Lasix 60 mg daily. When swelling is at it's worst, patient states that her left  leg is unrecognizable, and is unable to wear a shoe. Patient is on coumadin, last INR 2 on 08/23/2014. Consulted with Miguel Rota PA (FLEX), she advised patient to go to ED for further evaluation. Patient agreed to plan and verbalized understanding.

## 2014-09-20 ENCOUNTER — Ambulatory Visit (INDEPENDENT_AMBULATORY_CARE_PROVIDER_SITE_OTHER): Payer: Medicare Other | Admitting: *Deleted

## 2014-09-20 DIAGNOSIS — I4891 Unspecified atrial fibrillation: Secondary | ICD-10-CM

## 2014-09-20 LAB — POCT INR: INR: 2.6

## 2014-10-10 ENCOUNTER — Ambulatory Visit (HOSPITAL_COMMUNITY)
Admission: RE | Admit: 2014-10-10 | Discharge: 2014-10-10 | Disposition: A | Discharge status: Home / Self Care | Payer: Medicare Other | Source: Ambulatory Visit | Attending: Cardiology | Admitting: Cardiology

## 2014-10-10 ENCOUNTER — Other Ambulatory Visit (HOSPITAL_COMMUNITY): Payer: Self-pay | Admitting: Neurosurgery

## 2014-10-10 DIAGNOSIS — M7989 Other specified soft tissue disorders: Secondary | ICD-10-CM

## 2014-10-10 NOTE — Progress Notes (Signed)
Left lower extremity venous duplex completed. No evidence for DVT, SVT, or Baker's cyst. °Brianna L Mazza, RVT °

## 2014-10-18 ENCOUNTER — Other Ambulatory Visit: Payer: Self-pay | Admitting: *Deleted

## 2014-10-18 DIAGNOSIS — I872 Venous insufficiency (chronic) (peripheral): Secondary | ICD-10-CM

## 2014-10-19 ENCOUNTER — Telehealth (HOSPITAL_COMMUNITY): Payer: Self-pay | Admitting: *Deleted

## 2014-10-27 ENCOUNTER — Ambulatory Visit (INDEPENDENT_AMBULATORY_CARE_PROVIDER_SITE_OTHER): Payer: Medicare Other | Admitting: *Deleted

## 2014-10-27 ENCOUNTER — Ambulatory Visit (INDEPENDENT_AMBULATORY_CARE_PROVIDER_SITE_OTHER): Payer: Medicare Other | Admitting: Interventional Cardiology

## 2014-10-27 ENCOUNTER — Encounter: Payer: Self-pay | Admitting: Interventional Cardiology

## 2014-10-27 VITALS — BP 118/60 | HR 88 | Ht 64.0 in | Wt 153.1 lb

## 2014-10-27 DIAGNOSIS — I4891 Unspecified atrial fibrillation: Secondary | ICD-10-CM | POA: Diagnosis not present

## 2014-10-27 DIAGNOSIS — Z9889 Other specified postprocedural states: Secondary | ICD-10-CM

## 2014-10-27 DIAGNOSIS — Z79899 Other long term (current) drug therapy: Secondary | ICD-10-CM | POA: Diagnosis not present

## 2014-10-27 DIAGNOSIS — I480 Paroxysmal atrial fibrillation: Secondary | ICD-10-CM

## 2014-10-27 DIAGNOSIS — Z950 Presence of cardiac pacemaker: Secondary | ICD-10-CM

## 2014-10-27 DIAGNOSIS — I341 Nonrheumatic mitral (valve) prolapse: Secondary | ICD-10-CM

## 2014-10-27 DIAGNOSIS — I5032 Chronic diastolic (congestive) heart failure: Secondary | ICD-10-CM | POA: Diagnosis not present

## 2014-10-27 DIAGNOSIS — E785 Hyperlipidemia, unspecified: Secondary | ICD-10-CM

## 2014-10-27 LAB — POCT INR: INR: 2.8

## 2014-10-27 LAB — BASIC METABOLIC PANEL
BUN: 20 mg/dL (ref 6–23)
CHLORIDE: 102 meq/L (ref 96–112)
CO2: 29 meq/L (ref 19–32)
Calcium: 11 mg/dL — ABNORMAL HIGH (ref 8.4–10.5)
Creatinine, Ser: 0.92 mg/dL (ref 0.40–1.20)
GFR: 63.71 mL/min (ref >60.00)
Glucose, Bld: 110 mg/dL — ABNORMAL HIGH (ref 70–99)
Potassium: 3.9 mEq/L (ref 3.5–5.1)
Sodium: 137 mEq/L (ref 135–145)

## 2014-10-27 LAB — MAGNESIUM: Magnesium: 2.1 mg/dL (ref 1.5–2.5)

## 2014-10-27 NOTE — Patient Instructions (Signed)
Medication Instructions:  Your physician recommends that you continue on your current medications as directed. Please refer to the Current Medication list given to you today.  Labwork: Your physician recommends that you have lab work today: BMP and Magnesium  Your physician recommends that you return for lab work in: 4 MONTHS (BMP and Magnesium)   Testing/Procedures: No new orders.  Follow-Up: Your physician wants you to follow-up in: 6 MONTHS with Dr Tamala Julian.  You will receive a reminder letter in the mail two months in advance. If you don't receive a letter, please call our office to schedule the follow-up appointment.   Any Other Special Instructions Will Be Listed Below (If Applicable).

## 2014-10-27 NOTE — Progress Notes (Signed)
Patient ID: Jo Hughes, female   DOB: 03-30-1941, 74 y.o.   MRN: [id number]     Cardiology Office Note   Date:  10/27/2014   ID:  Jo Hughes, DOB Dec 10, 1940, MRN [id number]  PCP:  Irven Shelling, MD  Cardiologist:   Sinclair Grooms, MD   Chief Complaint  Patient presents with  . Atrial Fibrillation      History of Present Illness: Jo Hughes is a 74 y.o. female who presents for degenerative mitral valve disease, mitral valve repair, recurrent atrial fibrillation and flutter (status post ablation), diastolic heart failure when in atrial arrhythmia, hypertension, and anticoagulation. Prior amiodarone therapy but failed to maintain rhythm. Currently on dofetilide.  The patient is here today and has no for follow-up. She has no specific cardiac complaints. She has had some swelling and discomfort in her left greater than right lower extremity. She developed cellulitis and was treated with antibiotics. The problem has resolved. She is still concerned by discoloration in both lower extremities. She denies orthopnea. No chills or fever. No cough has developed. She has had no episodes of atrial fibrillation or flutter that she is aware of. She denies syncope.      Past Medical History  Diagnosis Date  . H/O mitral valve repair [facility] [place].  . H/O tricuspid valve repair   . Hx of acquired endocarditis 1970; 1986    Prior to mitral valve repair   . Hyperlipidemia     cardiac cath clean coronary arties in the past.  . Glaucoma   . Macular degeneration     early , [facility]cker  . Hypothyroid   . Atrial fibrillation     persistent  . Symptomatic bradycardia     s/p PPM  . Chronic diastolic CHF (congestive heart failure)   . Anxiety   . Thyroid nodule   . Long term (current) use of anticoagulants     No bleeding  . Kidney stone 10/2013    "pass it"  . 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 Mitral valve repair  05/2004    [facility]  . Cardioversion  ~ 2011; 09/18/2011    ?; Procedure: CARDIOVERSION;  Surgeon: Sinclair Grooms, MD;  Location: [place];  Service: Cardiovascular;  Laterality: N/A;  . Abdominal hysterectomy  1986  . Knee cartilage surgery Left 02/2009    scope; Dr. Berenice Primas  . Lumbar laminectomy  1997  . Tricuspid valve surgery  05/2004    "repair; [facility]"  . Back surgery    . Cataract extraction w/ intraocular lens  implant, bilateral  ~ 2011  . Cardiac catheterization  X 3  . Cardioversion N/A 04/29/2013    Procedure: CARDIOVERSION;  Surgeon: Sinclair Grooms, MD;  Location: [facility] ENDOSCOPY;  Service: Cardiovascular;  Laterality: N/A;  . Tee without cardioversion N/A 11/07/2013    Procedure: TRANSESOPHAGEAL ECHOCARDIOGRAM (TEE);  Surgeon: Pixie Casino, MD;  Location: [facility] ENDOSCOPY;  Service: Cardiovascular;  Laterality: N/A;  . Atrial fibrillation ablation  11/08/13    PVI by Dr Rayann Heman   . Insert / replace / remove pacemaker  01/13/2012    MDT Adapta L implanted by Dr Rayann Heman  . Dilation and curettage of uterus    . Tonsillectomy  1948  . Breast biopsy Left X 2    "benign"  .  Permanent pacemaker insertion N/A 01/13/2012    Procedure: PERMANENT PACEMAKER INSERTION;  Surgeon: Thompson Grayer, MD;  Location: [facility] C[facility] LAB;  Service: Cardiovascular;  Laterality: N/A;  . Atrial fibrillation ablation N/A 11/08/2013    Procedure: ATRIAL FIBRILLATION ABLATION;  Surgeon: Coralyn Mark, MD;  Location: [facility] CATH LAB;  Service: Cardiovascular;  Laterality: N/A;     Current Outpatient Prescriptions  Medication Sig Dispense Refill  . ALPRAZolam (XANAX) 0.5 MG tablet Take 0.5 mg by mouth 2 (two) times daily as needed for anxiety. For anxiety    . amoxicillin (AMOXIL) 500 MG capsule Take by mouth 4 (four) times daily as needed. Pt taking as needed for tooth pain. Pt not sure of dosage.    . Calcium Carbonate-Vitamin D (CALCIUM 600 + D PO)  Take 1 tablet by mouth daily.     . celecoxib (CELEBREX) 200 MG capsule Take 200 mg up to twice daily    . COSOPT PF 22.3-6.8 MG/ML SOLN     . cycloSPORINE (RESTASIS) 0.05 % ophthalmic emulsion Place 1 drop into both eyes 2 (two) times daily.     Marland Kitchen dofetilide (TIKOSYN) 125 MCG capsule Take 1 capsule (125 mcg total) by mouth 2 (two) times daily. 60 capsule 3  . dofetilide (TIKOSYN) 250 MCG capsule Take 1 capsule (250 mcg total) by mouth 2 (two) times daily. 60 capsule 3  . dorzolamide-timolol (COSOPT) 22.3-6.8 MG/ML ophthalmic solution Place 1 drop into both eyes 2 (two) times daily.    . furosemide (LASIX) 40 MG tablet TAKE 1 & 1/2 TABLETS ONCE DAILY. (Patient taking differently: TAKE 1 & 1/2 TABLETS ONCE DAILY. (60 mg)) 135 tablet 3  . HYDROcodone-acetaminophen (NORCO/VICODIN) 5-325 MG per tablet Take 1 tablet by mouth every 6 (six) hours as needed for moderate pain.    Marland Kitchen latanoprost (XALATAN) 0.005 % ophthalmic solution Place 1 drop into both eyes at bedtime.     Marland Kitchen levothyroxine (SYNTHROID, LEVOTHROID) 88 MCG tablet Take 88 mcg by mouth daily.    . metoprolol succinate (TOPROL-XL) 25 MG 24 hr tablet TAKE 1 TABLET TWICE DAILY.    . Multiple Vitamins-Minerals (ICAPS) CAPS Take 1 capsule by mouth 2 (two) times daily.     . Omega-3 Fatty Acids (FISH OIL) 1000 MG CAPS Take 1,000 mg by mouth 4 (four) times daily - after meals and at bedtime.    Marland Kitchen PARoxetine (PAXIL) 20 MG tablet Take 1 tablet (20 mg total) by mouth daily. 90 tablet 3  . potassium chloride SA (K-DUR,KLOR-CON) 20 MEQ tablet TAKE 1&1/2 TABLETS ONCE DAILY. (Patient taking differently: TAKE 1&1/2 TABLETS ONCE DAILY. (30 meq)) 45 tablet 11  . rosuvastatin (CRESTOR) 40 MG tablet Take 40 mg by mouth daily.    . traMADol (ULTRAM) 50 MG tablet Take 50 mg by mouth at bedtime as needed for moderate pain. For pain    . warfarin (COUMADIN) 5 MG tablet Take 2.5-5 mg by mouth daily. Take 1 tablet on Monday, Wednesday and Friday.  Take 0.5 tablet on  Sunday, Tuesday, Thursday and Saturday.    Marland Kitchen ZIOPTAN 0.0015 % SOLN Place 1 drop into both eyes at bedtime.     Marland Kitchen zolmitriptan (ZOMIG) 5 MG tablet Take 5 mg by mouth daily as needed for migraine.     Marland Kitchen zolpidem (AMBIEN) 10 MG tablet Take 10 mg by mouth at bedtime as needed. For sleep     No current facility-administered medications for this visit.    Allergies:   Citalopram hydrobromide and Trazodone  and nefazodone    Social History:  The patient  reports that she quit smoking about 29 years ago. Her smoking use included Cigarettes. She has a 24 pack-year smoking history. She has never used smokeless tobacco. She reports that she drinks about 8.4 oz of alcohol per week. She reports that she does not use illicit drugs.   Family History:  The patient's family history includes Heart attack (age of onset: 25) in her father; Hypertension in her brother; Macular degeneration in her mother; Stroke in her father; Transient ischemic attack in her mother.    ROS:  Please see the history of present illness.   Otherwise, review of systems are positive for left leg swelling has been a problem. Had left lower extremitycellulitis..   All other systems are reviewed and negative.    PHYSICAL EXAM: VS:  BP 118/60 mmHg  Pulse 88  Ht 5\' 4"  (1.626 m)  Wt 153 lb 1.9 oz (69.455 kg)  BMI 26.27 kg/m2 , BMI Body mass index is 26.27 kg/(m^2). GEN: Well nourished, well developed, in no acute distress HEENT: normal Neck: no JVD, carotid bruits, or masses Cardiac: RRR; no murmurs, rubs, or gallops,no edema . Mild stasis dermatitis both lower extremities. Respiratory:  clear to auscultation bilaterally, normal work of breathing GI: soft, nontender, nondistended, + BS MS: no deformity or atrophy Skin: warm and dry, no rash Neuro:  Strength and sensation are intact Psych: euthymic mood, full affect   EKG:  EKG is ordered today. The ekg ordered today demonstrates normal sinus rhythm with first-degree AV block  right bundle branch block and left axis deviation. First-degree AV block is also noted   Recent Labs: 01/11/2014: Hemoglobin 14.4; Platelets 178.0 08/02/2014: BUN 19; Creatinine 0.83; Magnesium 2.2; Potassium 4.2; Sodium 137    Lipid Panel No results found for: CHOL, TRIG, HDL, CHOLHDL, VLDL, LDLCALC, LDLDIRECT    Wt Readings from Last 3 Encounters:  10/27/14 153 lb 1.9 oz (69.455 kg)  08/02/14 162 lb (73.483 kg)  05/03/14 161 lb 12.8 oz (73.392 kg)      Other studies Reviewed: Additional studies/ records that were reviewed today include: .    ASSESSMENT AND PLAN:  Status post mitral valve repair: stable  Chronic diastolic heart failure: no volume overload  Dofetilide therapy: stable but will need K and Mg levels today  Paroxysmal atrial fibrillation: stable    Current medicines are reviewed at length with the patient today.  The patient does not have concerns regarding medicines.  The following changes have been made:  no change  Labs/ tests ordered today include:  No orders of the defined types were placed in this encounter.     Disposition:   FU with Jo Hughes in 6 months   Signed, Sinclair Grooms, MD  10/27/2014 8:51 AM    [address], [place], [place]  [address] Phone: [phone]; Fax: [phone]

## 2014-10-31 ENCOUNTER — Telehealth: Payer: Self-pay

## 2014-10-31 NOTE — Telephone Encounter (Signed)
Pt aware of lab results with verbal understanding. Magnesium and potassium levels are normal

## 2014-10-31 NOTE — Telephone Encounter (Signed)
-----   Message from Belva Crome, MD sent at 10/27/2014  5:29 PM EDT ----- Magnesium and potassium levels are normal

## 2014-11-01 ENCOUNTER — Encounter: Payer: Self-pay | Admitting: Internal Medicine

## 2014-11-01 ENCOUNTER — Ambulatory Visit (INDEPENDENT_AMBULATORY_CARE_PROVIDER_SITE_OTHER): Payer: Medicare Other | Admitting: Internal Medicine

## 2014-11-01 VITALS — BP 110/80 | HR 84 | Ht 64.0 in | Wt 155.8 lb

## 2014-11-01 DIAGNOSIS — I4819 Other persistent atrial fibrillation: Secondary | ICD-10-CM

## 2014-11-01 DIAGNOSIS — I481 Persistent atrial fibrillation: Secondary | ICD-10-CM

## 2014-11-01 DIAGNOSIS — R001 Bradycardia, unspecified: Secondary | ICD-10-CM

## 2014-11-01 DIAGNOSIS — I4891 Unspecified atrial fibrillation: Secondary | ICD-10-CM

## 2014-11-01 LAB — MDC_IDC_ENUM_SESS_TYPE_INCLINIC
Battery Remaining Longevity: 123 mo
Battery Voltage: 2.77 V
Brady Statistic AP VS Percent: 67 %
Lead Channel Impedance Value: 645 Ohm
Lead Channel Pacing Threshold Amplitude: 0.5 V
Lead Channel Pacing Threshold Amplitude: 0.75 V
Lead Channel Pacing Threshold Pulse Width: 0.4 ms
Lead Channel Pacing Threshold Pulse Width: 0.76 ms
Lead Channel Sensing Intrinsic Amplitude: 15.67 mV
Lead Channel Sensing Intrinsic Amplitude: 2 mV
Lead Channel Setting Pacing Amplitude: 2.5 V
Lead Channel Setting Pacing Pulse Width: 0.76 ms
Lead Channel Setting Sensing Sensitivity: 4 mV
MDC IDC MSMT BATTERY IMPEDANCE: 209 Ohm
MDC IDC MSMT LEADCHNL RA IMPEDANCE VALUE: 558 Ohm
MDC IDC SESS DTM: 20160420122310
MDC IDC SET LEADCHNL RA PACING AMPLITUDE: 2 V
MDC IDC STAT BRADY AP VP PERCENT: 0 %
MDC IDC STAT BRADY AS VP PERCENT: 0 %
MDC IDC STAT BRADY AS VS PERCENT: 33 %

## 2014-11-01 MED ORDER — CELECOXIB 200 MG PO CAPS: 200.0000 mg | ORAL_CAPSULE | ORAL | Status: DC | PRN

## 2014-11-01 NOTE — Progress Notes (Signed)
PCP:  Irven Shelling, MD Primary Cardiologist:  Dr Tamala Julian  The patient presents today for routine electrophysiology followup.  She is maintaining sinus with tikosyn.  She reports less fatigue.   No bleeding issues with coumadin.  She has had a recent leg cellulitis which is improving.  She is scheduled to see vascular surgery in follow-up.   Today, she denies symptoms of chest pain, shortness of breath, orthopnea, PND, lower extremity edema, dizziness, presyncope, syncope, or neurologic sequela. Positive for back and leg pain which seems to be worse in a lying position. The patient feels that she is  tolerating medications without difficulties and is otherwise without complaint today.   Past Medical History  Diagnosis Date  . H/O mitral valve repair [facility] [place].  . H/O tricuspid valve repair   . Hx of acquired endocarditis 1970; 1986    Prior to mitral valve repair   . Hyperlipidemia     cardiac cath clean coronary arties in the past.  . Glaucoma   . Macular degeneration     early , [facility]cker  . Hypothyroid   . Atrial fibrillation     persistent  . Symptomatic bradycardia     s/p PPM  . Chronic diastolic CHF (congestive heart failure)   . Anxiety   . Thyroid nodule   . Long term (current) use of anticoagulants     No bleeding  . Kidney stone 10/2013    "pass it"  . 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 Mitral valve repair  05/2004    [facility]  . Cardioversion  ~ 2011; 09/18/2011    ?; Procedure: CARDIOVERSION;  Surgeon: Sinclair Grooms, MD;  Location: [place];  Service: Cardiovascular;  Laterality: N/A;  . Abdominal hysterectomy  1986  . Knee cartilage surgery Left 02/2009    scope; Dr. Berenice Primas  . Lumbar laminectomy  1997  . Tricuspid valve surgery  05/2004    "repair; [facility]"  . Back surgery    . Cataract extraction w/ intraocular lens  implant,  bilateral  ~ 2011  . Cardiac catheterization  X 3  . Cardioversion N/A 04/29/2013    Procedure: CARDIOVERSION;  Surgeon: Sinclair Grooms, MD;  Location: [facility] ENDOSCOPY;  Service: Cardiovascular;  Laterality: N/A;  . Tee without cardioversion N/A 11/07/2013    Procedure: TRANSESOPHAGEAL ECHOCARDIOGRAM (TEE);  Surgeon: Pixie Casino, MD;  Location: [facility] ENDOSCOPY;  Service: Cardiovascular;  Laterality: N/A;  . Atrial fibrillation ablation  11/08/13    PVI by Dr Rayann Heman   . Insert / replace / remove pacemaker  01/13/2012    MDT Adapta L implanted by Dr Rayann Heman  . Dilation and curettage of uterus    . Tonsillectomy  1948  . Breast biopsy Left X 2    "benign"  . Permanent pacemaker insertion N/A 01/13/2012    Procedure: PERMANENT PACEMAKER INSERTION;  Surgeon: Thompson Grayer, MD;  Location: [facility] CATH LAB;  Service: Cardiovascular;  Laterality: N/A;  . Atrial fibrillation ablation N/A 11/08/2013    Procedure: ATRIAL FIBRILLATION ABLATION;  Surgeon: Coralyn Mark, MD;  Location: [place] CATH LAB;  Service: Cardiovascular;  Laterality: N/A;    Current Outpatient Prescriptions  Medication Sig Dispense Refill  . ALPRAZolam (XANAX) 0.5 MG tablet Take 0.5 mg by mouth 2 (two) times daily as needed for anxiety.     Marland Kitchen  amoxicillin (AMOXIL) 500 MG capsule Take by mouth 4 (four) times daily as needed. Pt taking as needed for tooth pain. Pt not sure of dosage.    . Calcium Carbonate-Vitamin D (CALCIUM 600 + D PO) Take 1 tablet by mouth daily.     . celecoxib (CELEBREX) 200 MG capsule Take 200 mg up to twice daily    . cycloSPORINE (RESTASIS) 0.05 % ophthalmic emulsion Place 1 drop into both eyes 2 (two) times daily.     Marland Kitchen dofetilide (TIKOSYN) 125 MCG capsule Take 1 capsule (125 mcg total) by mouth 2 (two) times daily. 60 capsule 3  . dofetilide (TIKOSYN) 250 MCG capsule Take 1 capsule (250 mcg total) by mouth 2 (two) times daily. 60 capsule 3  . dorzolamide-timolol (COSOPT) 22.3-6.8 MG/ML ophthalmic solution Place 1 drop  into both eyes 2 (two) times daily.    . furosemide (LASIX) 80 MG tablet Take 80 mg by mouth at bedtime.    Marland Kitchen HYDROcodone-acetaminophen (NORCO/VICODIN) 5-325 MG per tablet Take 1 tablet by mouth every 6 (six) hours as needed for moderate pain.    Marland Kitchen latanoprost (XALATAN) 0.005 % ophthalmic solution Place 1 drop into both eyes at bedtime.     Marland Kitchen levothyroxine (SYNTHROID, LEVOTHROID) 88 MCG tablet Take 88 mcg by mouth daily.    . metoprolol succinate (TOPROL-XL) 25 MG 24 hr tablet TAKE 1 TABLET BY MOUTH TWICE DAILY.    . Multiple Vitamins-Minerals (ICAPS) CAPS Take 1 capsule by mouth 2 (two) times daily.     . Omega-3 Fatty Acids (FISH OIL) 1000 MG CAPS Take 1,000 mg by mouth 4 (four) times daily - after meals and at bedtime.    Marland Kitchen PARoxetine (PAXIL) 20 MG tablet Take 1 tablet (20 mg total) by mouth daily. 90 tablet 3  . potassium chloride SA (K-DUR,KLOR-CON) 20 MEQ tablet TAKE 1&1/2 TABLETS ONCE DAILY. (Patient taking differently: TAKE 1&1/2 TABLETS ONCE DAILY. (30 meq)) 45 tablet 11  . rosuvastatin (CRESTOR) 40 MG tablet Take 40 mg by mouth daily.    . traMADol (ULTRAM) 50 MG tablet Take 50 mg by mouth at bedtime as needed for moderate pain. For pain    . warfarin (COUMADIN) 5 MG tablet Take 2.5-5 mg by mouth daily. Take 1 tablet on Monday, Wednesday and Friday.  Take 0.5 tablet on Sunday, Tuesday, Thursday and Saturday.    Marland Kitchen ZIOPTAN 0.0015 % SOLN Place 1 drop into both eyes at bedtime.     Marland Kitchen zolmitriptan (ZOMIG) 5 MG tablet Take 5 mg by mouth daily as needed for migraine.     Marland Kitchen zolpidem (AMBIEN) 10 MG tablet Take 10 mg by mouth at bedtime as needed. For sleep     No current facility-administered medications for this visit.    Allergies  Allergen Reactions  . Citalopram Hydrobromide Other (See Comments)    Headache and insomnia  . Trazodone And Nefazodone Other (See Comments)    Weakness.    History   Social History  . Marital Status: Married    Spouse Name: N/A  . Number of Children:  N/A  . Years of Education: N/A   Occupational History  . Not on file.   Social History Main Topics  . Smoking status: Former Smoker -- 1.00 packs/day for 24 years    Types: Cigarettes    Quit date: 07/14/1985  . Smokeless tobacco: Never Used  . Alcohol Use: 8.4 oz/week    14 Glasses of wine per week     Comment: "probably  2, 4oz glasses of wine/night"  . Drug Use: No  . Sexual Activity: Not Currently   Other Topics Concern  . Not on file   Social History Narrative   Lives in [place] with spouse.  No children.  Retired Education officer, museum.    Family History  Problem Relation Age of Onset  . Stroke Father   . Heart attack Father 89  . Macular degeneration Mother   . Transient ischemic attack Mother     multiple  . Hypertension Brother     ROS-  All systems are reviewed and are negative except as outlined in the HPI above  Physical Exam: Filed Vitals:   11/01/14 1129  BP: 110/80  Pulse: 84  Height: 5\' 4"  (1.626 m)  Weight: 155 lb 12.8 oz (70.67 kg)  SpO2: 95%    GEN- The patient is well appearing, alert and oriented x 3 today.   Head- normocephalic, atraumatic Eyes-  Sclera clear, conjunctiva pink Ears- hearing intact Oropharynx- clear Neck- supple, no JVP Lymph- no cervical lymphadenopathy Lungs- Clear to ausculation bilaterally, normal work of breathing Heart- regular rate and rhythm, no murmurs, rubs or gallops, PMI not laterally displaced GI- soft, NT, ND, + BS Extremities- no clubbing, cyanosis, or edema MS- no significant deformity or atrophy Skin- no rash or lesion, + palpable purpura both feet and lower legs Psych- euthymic mood, full affect Neuro- strength and sensation are   ekg last week reveals Sr with 1st degree AV block with sable QTc  Assessment and Plan:  1. Pesistent afib/ atypical atrial flutter Appears to be doing will s/p ablation / Tikosyn.   QTc, however, is prolonged and will  require change in meds. No changes today.  If QT  prolongs further, may have to reduce tikosyn.  I would like to avoid if able.  Will enroll in AF clinic with Roderic Palau NP for long term management (every 3 months)  2. Tachycardia/ bradycardia syndrome Normal pacemaker function See Pace Art report No changes today  3. Valvular heart disease Stable No changes today   F/u afib clinic in 3 months.  I will see in 12 months.   Thompson Grayer, MD 11/01/2014 12:06 PM

## 2014-11-01 NOTE — Patient Instructions (Signed)
Medication Instructions:  Your physician recommends that you continue on your current medications as directed. Please refer to the Current Medication list given to you today.   Labwork: None ordered  Testing/Procedures: None ordered  Follow-Up:  Your physician recommends that you schedule a follow-up appointment in: 3 months with Roderic Palau, NP  Remote monitoring is used to monitor your Pacemaker or ICD from home. This monitoring reduces the number of office visits required to check your device to one time per year. It allows [place] to keep an eye on the functioning of your device to ensure it is working properly. You are scheduled for a device check from home on 01/31/15. You may send your transmission at any time that day. If you have a wireless device, the transmission will be sent automatically. After your physician reviews your transmission, you will receive a postcard with your next transmission date.  Your physician wants you to follow-up in: 12 months with Dr Vallery Ridge will receive a reminder letter in the mail two months in advance. If you don't receive a letter, please call our office to schedule the follow-up appointment.   Any Other Special Instructions Will Be Listed Below (If Applicable).

## 2014-11-17 ENCOUNTER — Encounter: Payer: Self-pay | Admitting: Surgery

## 2014-11-20 ENCOUNTER — Encounter: Payer: Self-pay | Admitting: Surgery

## 2014-11-20 ENCOUNTER — Ambulatory Visit (HOSPITAL_COMMUNITY)
Admission: RE | Admit: 2014-11-20 | Discharge: 2014-11-20 | Disposition: A | Discharge status: Home / Self Care | Payer: Medicare Other | Source: Ambulatory Visit | Attending: Surgery | Admitting: Surgery

## 2014-11-20 ENCOUNTER — Ambulatory Visit (INDEPENDENT_AMBULATORY_CARE_PROVIDER_SITE_OTHER): Payer: Medicare Other | Admitting: Surgery

## 2014-11-20 VITALS — BP 120/63 | HR 91 | Ht 64.0 in | Wt 155.4 lb

## 2014-11-20 DIAGNOSIS — I872 Venous insufficiency (chronic) (peripheral): Secondary | ICD-10-CM

## 2014-11-20 NOTE — Progress Notes (Signed)
Patient name: Jo Hughes MRN: [id number] DOB: Jun 18, 1941 Sex: female   Referred by: Dr. Laurann Montana  Reason for referral:  Chief Complaint  Patient presents with  . New Evaluation    chronic L LE swelling     HISTORY OF PRESENT ILLNESS: As is a 74 year old female who comes in today for evaluation of pain and swelling in her left leg.  She states that this is been going on for approximately one year.  She reports having some form of infection in her leg a month ago that was treated with Keflex.  She states that her pain is worse at the end of the day and sometimes keeps her up at night.  The only thing that relieves her pain is narcotics.  She has never worn compression stockings.  She denies any history of trauma other than a knee scope approximately 5 years ago.  The patient has a history of mitral and tricuspid valve repair of the [facility] clinic many years ago.  She also suffers from coronary artery disease she has undergone atrial fibrillation.  Also, she has had a pacemaker inserted.  She takes Coumadin for atrial fibrillation.  She is on a statin for hypercholesterolemia.  Past Medical History  Diagnosis Date  . H/O mitral valve repair [facility] [place].  . H/O tricuspid valve repair   . Hx of acquired endocarditis 1970; 1986    Prior to mitral valve repair   . Hyperlipidemia     cardiac cath clean coronary arties in the past.  . Glaucoma   . Macular degeneration     early , [facility]  . Hypothyroid   . Atrial fibrillation     persistent  . Symptomatic bradycardia     s/p PPM  . Chronic diastolic CHF (congestive heart failure)   . Anxiety   . Thyroid nodule   . Long term (current) use of anticoagulants     No bleeding  . Kidney stone 10/2013    "pass it"  . 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 CAD (coronary artery disease)     Past Surgical History  Procedure Laterality Date  . Mitral valve repair  05/2004    [facility]  . Cardioversion  ~ 2011; 09/18/2011    ?; Procedure: CARDIOVERSION;  Surgeon: Sinclair Grooms, MD;  Location: [place];  Service: Cardiovascular;  Laterality: N/A;  . Abdominal hysterectomy  1986  . Knee cartilage surgery Left 02/2009    scope; Dr. Berenice Primas  . Lumbar laminectomy  1997  . Tricuspid valve surgery  05/2004    "repair; [facility]"  . Back surgery    . Cataract extraction w/ intraocular lens  implant, bilateral  ~ 2011  . Cardiac catheterization  X 3  . Cardioversion N/A 04/29/2013    Procedure: CARDIOVERSION;  Surgeon: Sinclair Grooms, MD;  Location[facility] ENDOSCOPY;  Service: Cardiovascular;  Laterality: N/A;  . Tee without cardioversion N/A 11/07/2013    Procedure: TRANSESOPHAGEAL ECHOCARDIOGRAM (TEE);  Surgeon: Pixie Casino, MD;  Location: [facility] ENDOSCOPY;  Service: Cardiovascular;  Laterality: N/A;  . Atrial fibrillation ablation  11/08/13    PVI by Dr Rayann Heman   . Insert / replace / remove pacemaker  01/13/2012    MDT Adapta L implanted by Dr Rayann Heman  . Dilation and curettage of uterus    . Tonsillectomy  1948  . Breast biopsy Left X  2    "benign"  . Permanent pacemaker insertion N/A 01/13/2012    Procedure: PERMANENT PACEMAKER INSERTION;  Surgeon: Thompson Grayer, MD;  Location: [facility] CATH LAB;  Service: Cardiovascular;  Laterality: N/A;  . Atrial fibrillation ablation N/A 11/08/2013    Procedure: ATRIAL FIBRILLATION ABLATION;  Surgeon: Coralyn Mark, MD;  Location: [place] CATH LAB;  Service: Cardiovascular;  Laterality: N/A;    History   Social History  . Marital Status: Married    Spouse Name: N/A  . Number of Children: N/A  . Years of Education: N/A   Occupational History  . Not on file.   Social History Main Topics  . Smoking status: Former Smoker -- 1.00 packs/day for 24 years    Types: Cigarettes    Quit date: 07/15/1987  . Smokeless tobacco: Never Used  . Alcohol Use: 8.4 oz/week    14 Glasses of wine per week     Comment: "probably 2, 4oz  glasses of wine/night"  . Drug Use: No  . Sexual Activity: Not Currently   Other Topics Concern  . Not on file   Social History Narrative   Lives in [place] with spouse.  No children.  Retired Education officer, museum.    Family History  Problem Relation Age of Onset  . Stroke Father   . Heart attack Father 48  . Heart disease Father   . Hyperlipidemia Father   . Hypertension Father   . Macular degeneration Mother   . Transient ischemic attack Mother     multiple  . Varicose Veins Mother   . Hypertension Brother     Allergies as of 11/20/2014 - Review Complete 11/20/2014  Allergen Reaction Noted  . Citalopram hydrobromide Other (See Comments) 08/17/2013  . Trazodone and nefazodone Other (See Comments) 08/21/2011    Current Outpatient Prescriptions on File Prior to Visit  Medication Sig Dispense Refill  . ALPRAZolam (XANAX) 0.5 MG tablet Take 0.5 mg by mouth 2 (two) times daily as needed for anxiety.     Marland Kitchen amoxicillin (AMOXIL) 500 MG capsule Take by mouth 4 (four) times daily as needed. Pt taking as needed for tooth pain. Pt not sure of dosage.    . Calcium Carbonate-Vitamin D (CALCIUM 600 + D PO) Take 1 tablet by mouth daily.     . celecoxib (CELEBREX) 200 MG capsule Take 1 capsule (200 mg total) by mouth as needed. Take 200 mg up to twice daily    . cycloSPORINE (RESTASIS) 0.05 % ophthalmic emulsion Place 1 drop into both eyes 2 (two) times daily.     Marland Kitchen dofetilide (TIKOSYN) 125 MCG capsule Take 1 capsule (125 mcg total) by mouth 2 (two) times daily. 60 capsule 3  . dofetilide (TIKOSYN) 250 MCG capsule Take 1 capsule (250 mcg total) by mouth 2 (two) times daily. 60 capsule 3  . dorzolamide-timolol (COSOPT) 22.3-6.8 MG/ML ophthalmic solution Place 1 drop into both eyes 2 (two) times daily.    . furosemide (LASIX) 80 MG tablet Take 80 mg by mouth at bedtime.    Marland Kitchen HYDROcodone-acetaminophen (NORCO/VICODIN) 5-325 MG per tablet Take 1 tablet by mouth every 6 (six) hours as needed for  moderate pain.    Marland Kitchen latanoprost (XALATAN) 0.005 % ophthalmic solution Place 1 drop into both eyes at bedtime.     Marland Kitchen levothyroxine (SYNTHROID, LEVOTHROID) 88 MCG tablet Take 88 mcg by mouth daily.    . metoprolol succinate (TOPROL-XL) 25 MG 24 hr tablet TAKE 1 TABLET BY MOUTH TWICE DAILY.    Marland Kitchen  Multiple Vitamins-Minerals (ICAPS) CAPS Take 1 capsule by mouth 2 (two) times daily.     . Omega-3 Fatty Acids (FISH OIL) 1000 MG CAPS Take 1,000 mg by mouth 4 (four) times daily - after meals and at bedtime.    Marland Kitchen PARoxetine (PAXIL) 20 MG tablet Take 1 tablet (20 mg total) by mouth daily. 90 tablet 3  . potassium chloride SA (K-DUR,KLOR-CON) 20 MEQ tablet TAKE 1&1/2 TABLETS ONCE DAILY. (Patient taking differently: TAKE 1&1/2 TABLETS ONCE DAILY. (30 meq)) 45 tablet 11  . rosuvastatin (CRESTOR) 40 MG tablet Take 40 mg by mouth daily.    . traMADol (ULTRAM) 50 MG tablet Take 50 mg by mouth at bedtime as needed for moderate pain. For pain    . warfarin (COUMADIN) 5 MG tablet Take 2.5-5 mg by mouth daily. Take 1 tablet on Monday, Wednesday and Friday.  Take 0.5 tablet on Sunday, Tuesday, Thursday and Saturday.    Marland Kitchen ZIOPTAN 0.0015 % SOLN Place 1 drop into both eyes at bedtime.     Marland Kitchen zolmitriptan (ZOMIG) 5 MG tablet Take 5 mg by mouth daily as needed for migraine.     Marland Kitchen zolpidem (AMBIEN) 10 MG tablet Take 10 mg by mouth at bedtime as needed. For sleep     No current facility-administered medications on file prior to visit.     REVIEW OF SYSTEMS: Cardiovascular: Positive for palpitations, shortness of breath when lying flat and exertion.  Positive for pain in legs on walking and when lying flat.  Positive for leg swelling. Pulmonary: No productive cough, asthma or wheezing. Neurologic: Positive for leg numbness and weakness Hematologic: No bleeding problems or clotting disorders. Musculoskeletal: No joint pain or joint swelling. Gastrointestinal: No blood in stool or hematemesis Genitourinary: No dysuria or  hematuria. Psychiatric:: No history of major depression. Integumentary: Positive for rash. Constitutional: No fever or chills.  PHYSICAL EXAMINATION:  Filed Vitals:   11/20/14 0932  BP: 120/63  Pulse: 91  Height: 5\' 4"  (1.626 m)  Weight: 155 lb 6.4 oz (70.489 kg)  SpO2: 100%   Body mass index is 26.66 kg/(m^2). General: The patient appears their stated age.   HEENT:  No gross abnormalities Pulmonary: Respirations are non-labored Musculoskeletal: There are no major deformities.   Neurologic: No focal weakness or paresthesias are detected, Skin: There are no ulcer or rashes noted. Psychiatric: The patient has normal affect. Cardiovascular: There is a regular rate and rhythm without significant murmur appreciated.  Palpable pedal pulses.  2+ pitting edema bilaterally, left greater than right.    Diagnostic Studies: I have reviewed her vascular lab venous ultrasound which shows deep vein reflux.  No significant superficial venous reflux.  No thrombosis    Assessment:  Venous insufficiency Plan: Bypass think today, the patient has left leg venous insufficiency.  This is consistent with her swelling.  I have recommended that she try compression stockings and leg elevation to see if this helps.  She does not have superficial venous reflux and therefore she is not a candidate for endovenous laser ablation.  In addition, she has palpable pedal pulses.  Therefore, I do not think she has any arterial insufficiency contributing to her pain.  I do not think that her venous insufficiency is 100% responsible for her symptoms.  I wonder if she has some form of degenerative back disease or possibly reflex synthetic dystrophy.  She will try compression stockings to see how this alleviates her symptoms.  From a vascular standpoint, no further workup is required  Eldridge Abrahams, M.D. [facility] Office: [phone] Pager:  [phone]

## 2014-11-22 ENCOUNTER — Encounter (INDEPENDENT_AMBULATORY_CARE_PROVIDER_SITE_OTHER): Payer: Medicare Other

## 2014-11-22 DIAGNOSIS — I872 Venous insufficiency (chronic) (peripheral): Secondary | ICD-10-CM

## 2014-12-06 ENCOUNTER — Ambulatory Visit (INDEPENDENT_AMBULATORY_CARE_PROVIDER_SITE_OTHER): Payer: Medicare Other

## 2014-12-06 DIAGNOSIS — I4891 Unspecified atrial fibrillation: Secondary | ICD-10-CM

## 2014-12-06 LAB — POCT INR: INR: 3.1

## 2014-12-14 ENCOUNTER — Other Ambulatory Visit: Payer: Self-pay | Admitting: Internal Medicine

## 2015-01-03 ENCOUNTER — Ambulatory Visit (INDEPENDENT_AMBULATORY_CARE_PROVIDER_SITE_OTHER): Payer: Medicare Other | Admitting: *Deleted

## 2015-01-03 DIAGNOSIS — I4891 Unspecified atrial fibrillation: Secondary | ICD-10-CM

## 2015-01-03 LAB — POCT INR: INR: 2.5

## 2015-01-26 ENCOUNTER — Telehealth: Payer: Self-pay | Admitting: Internal Medicine

## 2015-01-26 NOTE — Telephone Encounter (Signed)
New Message  Pt requested to speak w/ RN about remote check for 7/20. Pt wanted to know if it was necessary since she is going to the AF clinic sameday. Please call back and discuss.

## 2015-01-26 NOTE — Telephone Encounter (Signed)
Spoke w/pt and instructed to send transmission.

## 2015-01-31 ENCOUNTER — Ambulatory Visit (HOSPITAL_COMMUNITY)
Admission: RE | Admit: 2015-01-31 | Discharge: 2015-01-31 | Disposition: A | Discharge status: Home / Self Care | Payer: Medicare Other | Source: Ambulatory Visit | Attending: Nurse Practitioner | Admitting: Nurse Practitioner

## 2015-01-31 ENCOUNTER — Encounter (HOSPITAL_COMMUNITY): Payer: Self-pay | Admitting: Nurse Practitioner

## 2015-01-31 ENCOUNTER — Ambulatory Visit (INDEPENDENT_AMBULATORY_CARE_PROVIDER_SITE_OTHER): Payer: Medicare Other | Admitting: *Deleted

## 2015-01-31 ENCOUNTER — Encounter: Payer: Medicare Other | Admitting: *Deleted

## 2015-01-31 ENCOUNTER — Other Ambulatory Visit: Payer: Self-pay

## 2015-01-31 ENCOUNTER — Telehealth: Payer: Self-pay | Admitting: Cardiology

## 2015-01-31 VITALS — BP 122/84 | HR 83 | Ht 64.0 in | Wt 154.8 lb

## 2015-01-31 DIAGNOSIS — I4891 Unspecified atrial fibrillation: Secondary | ICD-10-CM

## 2015-01-31 DIAGNOSIS — I495 Sick sinus syndrome: Secondary | ICD-10-CM | POA: Diagnosis not present

## 2015-01-31 DIAGNOSIS — I481 Persistent atrial fibrillation: Secondary | ICD-10-CM

## 2015-01-31 DIAGNOSIS — I4819 Other persistent atrial fibrillation: Secondary | ICD-10-CM

## 2015-01-31 LAB — POCT INR: INR: 2.4

## 2015-01-31 LAB — BASIC METABOLIC PANEL
Anion gap: 5 (ref 5–15)
BUN: 18 mg/dL (ref 6–20)
CALCIUM: 10.3 mg/dL (ref 8.9–10.3)
CHLORIDE: 104 mmol/L (ref 101–111)
CO2: 29 mmol/L (ref 22–32)
Creatinine, Ser: 0.92 mg/dL (ref 0.44–1.00)
GFR calc Af Amer: >60 mL/min (ref >60)
GFR calc non Af Amer: >60 mL/min (ref >60)
Glucose, Bld: 111 mg/dL — ABNORMAL HIGH (ref 65–99)
Potassium: 4.3 mmol/L (ref 3.5–5.1)
Sodium: 138 mmol/L (ref 135–145)

## 2015-01-31 LAB — MAGNESIUM: Magnesium: 2.2 mg/dL (ref 1.7–2.4)

## 2015-01-31 NOTE — Progress Notes (Signed)
Patient ID: Jo Hughes, female   DOB: 05-Feb-1941, 74 y.o.   MRN: [id number]         PCP:  Irven Shelling, MD Primary Cardiologist:  Dr Tamala Julian  The patient presents today for routine  follow up in the afib clinic.  She is maintaining sinus with tikosyn.  She reports less fatigue.   No bleeding issues with coumadin.  She is due to send a remote device check today.    Today, she denies symptoms of chest pain, shortness of breath, orthopnea, PND, lower extremity edema, dizziness, presyncope, syncope, or neurologic sequela. Positive for back and leg pain which seems to be worse in a lying position. The patient feels that she is  tolerating medications without difficulties and is otherwise without complaint today.   Past Medical History  Diagnosis Date  . H/O mitral valve repair [facility] , [place].  . H/O tricuspid valve repair   . Hx of acquired endocarditis 1970; 1986    Prior to mitral valve repair   . Hyperlipidemia     cardiac cath clean coronary arties in the past.  . Glaucoma   . Macular degeneration     early , [place]  . Hypothyroid   . Atrial fibrillation     persistent  . Symptomatic bradycardia     s/p PPM  . Chronic diastolic CHF (congestive heart failure)   . Anxiety   . Thyroid nodule   . Long term (current) use of anticoagulants     No bleeding  . Kidney stone 10/2013    "pass it"  . 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 CAD (coronary artery disease)    Past Surgical History  Procedure Laterality Date  . Mitral valve repair  05/2004    [facility]  . Cardioversion  ~ 2011; 09/18/2011    ?; Procedure: CARDIOVERSION;  Surgeon: Sinclair Grooms, MD;  Location: [place];  Service: Cardiovascular;  Laterality: N/A;  . Abdominal hysterectomy  1986  . Knee cartilage surgery Left 02/2009    scope; Dr. Berenice Primas  . Lumbar laminectomy  1997  . Tricuspid valve surgery  05/2004    "repair; [facility]"  . Back  surgery    . Cataract extraction w/ intraocular lens  implant, bilateral  ~ 2011  . Cardiac catheterization  X 3  . Cardioversion N/A 04/29/2013    Procedure: CARDIOVERSION;  Surgeon: Sinclair Grooms, MD;  Location: [facility] ENDOSCOPY;  Service: Cardiovascular;  Laterality: N/A;  . Tee without cardioversion N/A 11/07/2013    Procedure: TRANSESOPHAGEAL ECHOCARDIOGRAM (TEE);  Surgeon: Pixie Casino, MD;  Location: [facility] ENDOSCOPY;  Service: Cardiovascular;  Laterality: N/A;  . Atrial fibrillation ablation  11/08/13    PVI by Dr Rayann Heman   . Insert / replace / remove pacemaker  01/13/2012    MDT Adapta L implanted by Dr Rayann Heman  . Dilation and curettage of uterus    . Tonsillectomy  1948  . Breast biopsy Left X 2    "benign"  . Permanent pacemaker insertion N/A 01/13/2012    Procedure: PERMANENT PACEMAKER INSERTION;  Surgeon: Thompson Grayer, MD;  Location: [facility] CATH LAB;  Service: Cardiovascular;  Laterality: N/A;  . Atrial fibrillation ablation N/A 11/08/2013    Procedure: ATRIAL FIBRILLATION ABLATION;  Surgeon: Coralyn Mark, MD;  Location: [place] CATH LAB;  Service: Cardiovascular;  Laterality: N/A;    Current Outpatient Prescriptions  Medication Sig  Dispense Refill  . ALPRAZolam (XANAX) 0.5 MG tablet Take 0.5 mg by mouth 2 (two) times daily as needed for anxiety.     Marland Kitchen amoxicillin (AMOXIL) 500 MG capsule Take by mouth 4 (four) times daily as needed. Pt taking as needed for tooth pain. Pt not sure of dosage.    . Calcium Carbonate-Vitamin D (CALCIUM 600 + D PO) Take 1 tablet by mouth daily.     . celecoxib (CELEBREX) 200 MG capsule Take 1 capsule (200 mg total) by mouth as needed. Take 200 mg up to twice daily    . cycloSPORINE (RESTASIS) 0.05 % ophthalmic emulsion Place 1 drop into both eyes 2 (two) times daily.     Marland Kitchen dofetilide (TIKOSYN) 125 MCG capsule Take 1 capsule (125 mcg total) by mouth 2 (two) times daily. 60 capsule 3  . dorzolamide-timolol (COSOPT) 22.3-6.8 MG/ML ophthalmic solution Place 1 drop  into both eyes 2 (two) times daily.    . furosemide (LASIX) 80 MG tablet Take 80 mg by mouth at bedtime.    Marland Kitchen HYDROcodone-acetaminophen (NORCO/VICODIN) 5-325 MG per tablet Take 1 tablet by mouth every 6 (six) hours as needed for moderate pain.    Marland Kitchen latanoprost (XALATAN) 0.005 % ophthalmic solution Place 1 drop into both eyes at bedtime.     Marland Kitchen levothyroxine (SYNTHROID, LEVOTHROID) 88 MCG tablet Take 88 mcg by mouth daily.    . metoprolol succinate (TOPROL-XL) 25 MG 24 hr tablet TAKE 1 TABLET BY MOUTH TWICE DAILY.    . Multiple Vitamins-Minerals (ICAPS) CAPS Take 1 capsule by mouth 2 (two) times daily.     . Omega-3 Fatty Acids (FISH OIL) 1000 MG CAPS Take 1,000 mg by mouth 4 (four) times daily - after meals and at bedtime.    Marland Kitchen PARoxetine (PAXIL) 20 MG tablet Take 1 tablet (20 mg total) by mouth daily. 90 tablet 3  . potassium chloride SA (K-DUR,KLOR-CON) 20 MEQ tablet TAKE 1&1/2 TABLETS ONCE DAILY. (Patient taking differently: TAKE 1&1/2 TABLETS ONCE DAILY. (30 meq)) 45 tablet 11  . rosuvastatin (CRESTOR) 40 MG tablet Take 40 mg by mouth daily.    Marland Kitchen TIKOSYN 250 MCG capsule TAKE (1) CAPSULE TWICE DAILY.(TAKE ALONG WITH A 125MCG) 60 capsule 6  . traMADol (ULTRAM) 50 MG tablet Take 50 mg by mouth at bedtime as needed for moderate pain. For pain    . warfarin (COUMADIN) 5 MG tablet Take 2.5-5 mg by mouth daily. Take 1 tablet on Monday, Wednesday and Friday.  Take 0.5 tablet on Sunday, Tuesday, Thursday and Saturday.    Marland Kitchen ZIOPTAN 0.0015 % SOLN Place 1 drop into both eyes at bedtime.     Marland Kitchen zolmitriptan (ZOMIG) 5 MG tablet Take 5 mg by mouth daily as needed for migraine.     Marland Kitchen zolpidem (AMBIEN) 10 MG tablet Take 10 mg by mouth at bedtime as needed. For sleep    . COSOPT PF 22.3-6.8 MG/ML SOLN Place 1 drop into both eyes daily.     No current facility-administered medications for this encounter.    Allergies  Allergen Reactions  . Citalopram Hydrobromide Other (See Comments)    Headache and insomnia   . Trazodone And Nefazodone Other (See Comments)    Weakness.    History   Social History  . Marital Status: Married    Spouse Name: N/A  . Number of Children: N/A  . Years of Education: N/A   Occupational History  . Not on file.   Social History Main Topics  .  Smoking status: Former Smoker -- 1.00 packs/day for 24 years    Types: Cigarettes    Quit date: 07/15/1987  . Smokeless tobacco: Never Used  . Alcohol Use: 8.4 oz/week    14 Glasses of wine per week     Comment: "probably 2, 4oz glasses of wine/night"  . Drug Use: No  . Sexual Activity: Not Currently   Other Topics Concern  . Not on file   Social History Narrative   Lives in [place] with spouse.  No children.  Retired Education officer, museum.    Family History  Problem Relation Age of Onset  . Stroke Father   . Heart attack Father 49  . Heart disease Father   . Hyperlipidemia Father   . Hypertension Father   . Macular degeneration Mother   . Transient ischemic attack Mother     multiple  . Varicose Veins Mother   . Hypertension Brother     ROS-  All systems are reviewed and are negative except as outlined in the HPI above  Physical Exam: Filed Vitals:   01/31/15 1342  BP: 122/84  Pulse: 83  Height: 5\' 4"  (1.626 m)  Weight: 154 lb 12.8 oz (70.217 kg)    GEN- The patient is well appearing, alert and oriented x 3 today.   Head- normocephalic, atraumatic Eyes-  Sclera clear, conjunctiva pink Ears- hearing intact Oropharynx- clear Neck- supple, no JVP Lymph- no cervical lymphadenopathy Lungs- Clear to ausculation bilaterally, normal work of breathing Heart- regular rate and rhythm, no murmurs, rubs or gallops, PMI not laterally displaced GI- soft, NT, ND, + BS Extremities- no clubbing, cyanosis, or edema MS- no significant deformity or atrophy Skin- no rash or lesion, + palpable purpura both feet and lower legs Psych- euthymic mood, full affect Neuro- strength and sensation are   EKG- NSR IRBBB,  RT ventricular hypertrophy. PR int 206 sec,QRS 94 ms, QTc 505 ms.  QTc is much improved compared to previous QTc readings on prior EKG's.  Assessment and Plan:  1. Pesistent afib/ atypical atrial flutter Appears to be doing well s/p ablation / Tikosyn.  QTc looks acceptable but will be sent by staff message for Dr. Rayann Heman to review. Bmet/mag today  2. Tachycardia/ bradycardia syndrome Due to send remote check today  3. Valvular heart disease Stable No changes today   F/u afib clinic in 3 months, Dr. Rayann Heman in one year.   Roderic Palau, MD 01/31/2015 4:14 PM

## 2015-01-31 NOTE — Telephone Encounter (Signed)
Confirmed remote transmission w/ pt husband.   

## 2015-01-31 NOTE — Patient Instructions (Signed)
Parking code for October 0900

## 2015-02-01 ENCOUNTER — Encounter: Payer: Self-pay | Admitting: Cardiology

## 2015-02-08 ENCOUNTER — Ambulatory Visit (INDEPENDENT_AMBULATORY_CARE_PROVIDER_SITE_OTHER): Payer: Medicare Other

## 2015-02-08 DIAGNOSIS — R001 Bradycardia, unspecified: Secondary | ICD-10-CM | POA: Diagnosis not present

## 2015-02-08 DIAGNOSIS — Z950 Presence of cardiac pacemaker: Secondary | ICD-10-CM

## 2015-02-19 LAB — CUP PACEART REMOTE DEVICE CHECK
Brady Statistic AP VP Percent: 0 %
Brady Statistic AP VS Percent: 74 %
Brady Statistic AS VS Percent: 25 %
Date Time Interrogation Session: 20160728151630
Lead Channel Impedance Value: 567 Ohm
Lead Channel Impedance Value: 683 Ohm
Lead Channel Pacing Threshold Amplitude: 0.625 V
Lead Channel Pacing Threshold Pulse Width: 0.4 ms
Lead Channel Sensing Intrinsic Amplitude: 0.7 mV
Lead Channel Setting Pacing Amplitude: 2 V
Lead Channel Setting Pacing Amplitude: 2.5 V
Lead Channel Setting Sensing Sensitivity: 4 mV
MDC IDC MSMT BATTERY IMPEDANCE: 233 Ohm
MDC IDC MSMT BATTERY REMAINING LONGEVITY: 119 mo
MDC IDC MSMT BATTERY VOLTAGE: 2.77 V
MDC IDC MSMT LEADCHNL RV SENSING INTR AMPL: 11.2 mV
MDC IDC SET LEADCHNL RV PACING PULSEWIDTH: 0.76 ms
MDC IDC STAT BRADY AS VP PERCENT: 0 %

## 2015-02-26 ENCOUNTER — Encounter: Payer: Self-pay | Admitting: Cardiology

## 2015-02-28 ENCOUNTER — Other Ambulatory Visit (INDEPENDENT_AMBULATORY_CARE_PROVIDER_SITE_OTHER): Payer: Medicare Other | Admitting: *Deleted

## 2015-02-28 ENCOUNTER — Ambulatory Visit (INDEPENDENT_AMBULATORY_CARE_PROVIDER_SITE_OTHER): Payer: Medicare Other | Admitting: *Deleted

## 2015-02-28 DIAGNOSIS — Z79899 Other long term (current) drug therapy: Secondary | ICD-10-CM

## 2015-02-28 DIAGNOSIS — I480 Paroxysmal atrial fibrillation: Secondary | ICD-10-CM | POA: Diagnosis not present

## 2015-02-28 DIAGNOSIS — I4891 Unspecified atrial fibrillation: Secondary | ICD-10-CM | POA: Diagnosis not present

## 2015-02-28 LAB — BASIC METABOLIC PANEL
BUN: 17 mg/dL (ref 6–23)
CALCIUM: 10 mg/dL (ref 8.4–10.5)
CO2: 29 mEq/L (ref 19–32)
CREATININE: 0.83 mg/dL (ref 0.40–1.20)
Chloride: 106 mEq/L (ref 96–112)
GFR: 71.68 mL/min (ref >60.00)
GLUCOSE: 102 mg/dL — AB (ref 70–99)
Potassium: 3.8 mEq/L (ref 3.5–5.1)
Sodium: 139 mEq/L (ref 135–145)

## 2015-02-28 LAB — MAGNESIUM: Magnesium: 2 mg/dL (ref 1.5–2.5)

## 2015-02-28 LAB — POCT INR: INR: 2.1

## 2015-03-02 ENCOUNTER — Telehealth: Payer: Self-pay

## 2015-03-02 DIAGNOSIS — I5032 Chronic diastolic (congestive) heart failure: Secondary | ICD-10-CM

## 2015-03-02 MED ORDER — POTASSIUM CHLORIDE CRYS ER 20 MEQ PO TBCR: 20.0000 meq | EXTENDED_RELEASE_TABLET | Freq: Two times a day (BID) | ORAL | Status: DC

## 2015-03-02 NOTE — Telephone Encounter (Signed)
-----   Message from Belva Crome, MD sent at 02/28/2015  6:41 PM EDT ----- Potassium is low normal. Increase K Dur to 20 mEq twice daily from 1-1/2 tablets daily. Repeat Bmet 7 days.

## 2015-03-02 NOTE — Telephone Encounter (Signed)
Pt aware of lab results and Dr.Smith's recommendation. Potassium is low normal. Increase K Dur to 20 mEq twice daily from 1-1/2 tablets daily. Repeat Bmet 7 days Lab appt scheduled for 8/26. Pt verbalized understanding.

## 2015-03-05 ENCOUNTER — Encounter: Payer: Self-pay | Admitting: Internal Medicine

## 2015-03-09 ENCOUNTER — Other Ambulatory Visit (INDEPENDENT_AMBULATORY_CARE_PROVIDER_SITE_OTHER): Payer: Medicare Other

## 2015-03-09 DIAGNOSIS — I5032 Chronic diastolic (congestive) heart failure: Secondary | ICD-10-CM

## 2015-03-09 LAB — BASIC METABOLIC PANEL
BUN: 17 mg/dL (ref 6–23)
CO2: 26 mEq/L (ref 19–32)
Calcium: 10.2 mg/dL (ref 8.4–10.5)
Chloride: 102 mEq/L (ref 96–112)
Creatinine, Ser: 0.88 mg/dL (ref 0.40–1.20)
GFR: 66.99 mL/min (ref >60.00)
Glucose, Bld: 106 mg/dL — ABNORMAL HIGH (ref 70–99)
Potassium: 4.3 mEq/L (ref 3.5–5.1)
Sodium: 135 mEq/L (ref 135–145)

## 2015-03-13 ENCOUNTER — Other Ambulatory Visit: Payer: Self-pay

## 2015-03-13 MED ORDER — POTASSIUM CHLORIDE CRYS ER 20 MEQ PO TBCR: 20.0000 meq | EXTENDED_RELEASE_TABLET | Freq: Two times a day (BID) | ORAL | Status: DC

## 2015-03-28 ENCOUNTER — Ambulatory Visit (INDEPENDENT_AMBULATORY_CARE_PROVIDER_SITE_OTHER): Payer: Medicare Other | Admitting: *Deleted

## 2015-03-28 DIAGNOSIS — I4891 Unspecified atrial fibrillation: Secondary | ICD-10-CM | POA: Diagnosis not present

## 2015-03-28 LAB — POCT INR: INR: 1.4

## 2015-04-01 ENCOUNTER — Other Ambulatory Visit: Payer: Self-pay | Admitting: Internal Medicine

## 2015-04-06 ENCOUNTER — Ambulatory Visit (INDEPENDENT_AMBULATORY_CARE_PROVIDER_SITE_OTHER): Payer: Medicare Other | Admitting: *Deleted

## 2015-04-06 DIAGNOSIS — I4891 Unspecified atrial fibrillation: Secondary | ICD-10-CM

## 2015-04-06 LAB — POCT INR: INR: 1.4

## 2015-04-13 ENCOUNTER — Ambulatory Visit (INDEPENDENT_AMBULATORY_CARE_PROVIDER_SITE_OTHER): Payer: Medicare Other | Admitting: *Deleted

## 2015-04-13 DIAGNOSIS — I4891 Unspecified atrial fibrillation: Secondary | ICD-10-CM

## 2015-04-13 LAB — POCT INR: INR: 1.9

## 2015-04-20 ENCOUNTER — Ambulatory Visit (INDEPENDENT_AMBULATORY_CARE_PROVIDER_SITE_OTHER): Payer: Medicare Other

## 2015-04-20 DIAGNOSIS — I4891 Unspecified atrial fibrillation: Secondary | ICD-10-CM | POA: Diagnosis not present

## 2015-04-20 LAB — POCT INR: INR: 2.2

## 2015-05-03 ENCOUNTER — Ambulatory Visit (HOSPITAL_COMMUNITY)
Admission: RE | Admit: 2015-05-03 | Discharge: 2015-05-03 | Disposition: A | Discharge status: Home / Self Care | Payer: Medicare Other | Source: Ambulatory Visit | Attending: Nurse Practitioner | Admitting: Nurse Practitioner

## 2015-05-03 ENCOUNTER — Ambulatory Visit (INDEPENDENT_AMBULATORY_CARE_PROVIDER_SITE_OTHER): Payer: Medicare Other | Admitting: Interventional Cardiology

## 2015-05-03 ENCOUNTER — Encounter: Payer: Self-pay | Admitting: Interventional Cardiology

## 2015-05-03 ENCOUNTER — Encounter (HOSPITAL_COMMUNITY): Payer: Self-pay | Admitting: Nurse Practitioner

## 2015-05-03 ENCOUNTER — Ambulatory Visit (INDEPENDENT_AMBULATORY_CARE_PROVIDER_SITE_OTHER): Payer: Medicare Other

## 2015-05-03 VITALS — BP 120/64 | HR 67 | Ht 64.0 in | Wt 156.8 lb

## 2015-05-03 VITALS — BP 100/66 | HR 84 | Ht 64.0 in | Wt 156.0 lb

## 2015-05-03 DIAGNOSIS — Z79899 Other long term (current) drug therapy: Secondary | ICD-10-CM

## 2015-05-03 DIAGNOSIS — I480 Paroxysmal atrial fibrillation: Secondary | ICD-10-CM | POA: Diagnosis not present

## 2015-05-03 DIAGNOSIS — Z950 Presence of cardiac pacemaker: Secondary | ICD-10-CM

## 2015-05-03 DIAGNOSIS — R0683 Snoring: Secondary | ICD-10-CM | POA: Diagnosis not present

## 2015-05-03 DIAGNOSIS — I481 Persistent atrial fibrillation: Secondary | ICD-10-CM | POA: Diagnosis present

## 2015-05-03 DIAGNOSIS — I5032 Chronic diastolic (congestive) heart failure: Secondary | ICD-10-CM

## 2015-05-03 DIAGNOSIS — I484 Atypical atrial flutter: Secondary | ICD-10-CM | POA: Diagnosis not present

## 2015-05-03 DIAGNOSIS — I4891 Unspecified atrial fibrillation: Secondary | ICD-10-CM

## 2015-05-03 DIAGNOSIS — I38 Endocarditis, valve unspecified: Secondary | ICD-10-CM | POA: Insufficient documentation

## 2015-05-03 DIAGNOSIS — Z9889 Other specified postprocedural states: Secondary | ICD-10-CM

## 2015-05-03 DIAGNOSIS — I495 Sick sinus syndrome: Secondary | ICD-10-CM | POA: Diagnosis not present

## 2015-05-03 LAB — MAGNESIUM: Magnesium: 2.3 mg/dL (ref 1.7–2.4)

## 2015-05-03 LAB — BASIC METABOLIC PANEL
ANION GAP: 7 (ref 5–15)
BUN: 16 mg/dL (ref 6–20)
CALCIUM: 10.6 mg/dL — AB (ref 8.9–10.3)
CO2: 26 mmol/L (ref 22–32)
Chloride: 108 mmol/L (ref 101–111)
Creatinine, Ser: 0.79 mg/dL (ref 0.44–1.00)
GFR calc Af Amer: >60 mL/min (ref >60)
GLUCOSE: 146 mg/dL — AB (ref 65–99)
Potassium: 4.1 mmol/L (ref 3.5–5.1)
Sodium: 141 mmol/L (ref 135–145)

## 2015-05-03 LAB — TSH: TSH: 1.266 u[IU]/mL (ref 0.350–4.500)

## 2015-05-03 LAB — POCT INR: INR: 2

## 2015-05-03 NOTE — Patient Instructions (Signed)
Medication Instructions:  Your physician recommends that you continue on your current medications as directed. Please refer to the Current Medication list given to you today.   Labwork: Non ordered  Testing/Procedures: Your physician has recommended that you have a sleep study. This test records several body functions during sleep, including: brain activity, eye movement, oxygen and carbon dioxide blood levels, heart rate and rhythm, breathing rate and rhythm, the flow of air through your mouth and nose, snoring, body muscle movements, and chest and belly movement.  Follow-Up: Your physician wants you to follow-up in: 1 year with Dr.Smith You will receive a reminder letter in the mail two months in advance. If you don't receive a letter, please call our office to schedule the follow-up appointment.   Any Other Special Instructions Will Be Listed Below (If Applicable).

## 2015-05-03 NOTE — Progress Notes (Signed)
Patient ID: Jo Hughes, female   DOB: 1941/03/12, 74 y.o.   MRN: [id number]         PCP:  Irven Shelling, MD Primary Cardiologist:  Dr Tamala Julian  The patient presents today for routine  follow up in the afib clinic.  She is maintaining sinus rhythm with tikosyn.  She reports less fatigue.   No bleeding issues with coumadin.  She is due to send a remote device check 10/27. INR checked today and also saw Dr. Tamala Julian today for f/u.   Today, she denies symptoms of chest pain, shortness of breath, orthopnea, PND, lower extremity edema, dizziness, presyncope, syncope, or neurologic sequela. Positive for back and leg pain which seems to be worse in a lying position. The patient feels that she is  tolerating medications without difficulties and is otherwise without complaint today.   Past Medical History  Diagnosis Date  . H/O mitral valve repair [facility] , [place].  . H/O tricuspid valve repair   . Hx of acquired endocarditis 1970; 1986    Prior to mitral valve repair   . Hyperlipidemia     cardiac cath clean coronary arties in the past.  . Glaucoma   . Macular degeneration     early , [place]  . Hypothyroid   . Atrial fibrillation ([place])     persistent  . Symptomatic bradycardia     s/p PPM  . Chronic diastolic CHF (congestive heart failure) ([place])   . Anxiety   . Thyroid nodule   . Long term (current) use of anticoagulants     No bleeding  . Kidney stone 10/2013    "pass it"  . 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 CAD (coronary artery disease)    Past Surgical History  Procedure Laterality Date  . Mitral valve repair  05/2004    [facility]  . Cardioversion  ~ 2011; 09/18/2011    ?; Procedure: CARDIOVERSION;  Surgeon: Sinclair Grooms, MD;  Location: [place];  Service: Cardiovascular;  Laterality: N/A;  . Abdominal hysterectomy  1986  . Knee cartilage surgery Left 02/2009    scope; Dr. Berenice Primas  . Lumbar laminectomy  1997    . Tricuspid valve surgery  05/2004    "repair; [facility]"  . Back surgery    . Cataract extraction w/ intraocular lens  implant, bilateral  ~ 2011  . Cardiac catheterization  X 3  . Cardioversion N/A 04/29/2013    Procedure: CARDIOVERSION;  Surgeon: Sinclair Grooms, MD;  Location: [facility] ENDOSCOPY;  Service: Cardiovascular;  Laterality: N/A;  . Tee without cardioversion N/A 11/07/2013    Procedure: TRANSESOPHAGEAL ECHOCARDIOGRAM (TEE);  Surgeon: Pixie Casino, MD;  Location: [facility] ENDOSCOPY;  Service: Cardiovascular;  Laterality: N/A;  . Atrial fibrillation ablation  11/08/13    PVI by Dr Rayann Heman   . Insert / replace / remove pacemaker  01/13/2012    MDT Adapta L implanted by Dr Rayann Heman  . Dilation and curettage of uterus    . Tonsillectomy  1948  . Breast biopsy Left X 2    "benign"  . Permanent pacemaker insertion N/A 01/13/2012    Procedure: PERMANENT PACEMAKER INSERTION;  Surgeon: Thompson Grayer, MD;  Location: [facility] CATH LAB;  Service: Cardiovascular;  Laterality: N/A;  . Atrial fibrillation ablation N/A 11/08/2013    Procedure: ATRIAL FIBRILLATION ABLATION;  Surgeon: Coralyn Mark, MD;  Location: [place] CATH LAB;  Service: Cardiovascular;  Laterality: N/A;    Current Outpatient Prescriptions  Medication Sig Dispense Refill  . ALPRAZolam (XANAX) 0.5 MG tablet Take 0.5 mg by mouth 2 (two) times daily as needed for anxiety.     Marland Kitchen amoxicillin (AMOXIL) 500 MG capsule Take four (4) capsules by mouth one (1) hour prior to dental procedures.    . Calcium Carbonate-Vitamin D (CALCIUM 600 + D PO) Take 1 tablet by mouth daily.     . celecoxib (CELEBREX) 200 MG capsule Take 200 mg by mouth 2 (two) times daily as needed for mild pain.    . COSOPT PF 22.3-6.8 MG/ML SOLN Place 1 drop into both eyes daily.    . cycloSPORINE (RESTASIS) 0.05 % ophthalmic emulsion Place 1 drop into both eyes 2 (two) times daily.     . dorzolamide-timolol (COSOPT) 22.3-6.8 MG/ML ophthalmic solution Place 1 drop into both eyes  2 (two) times daily.    . furosemide (LASIX) 80 MG tablet Take 80 mg by mouth at bedtime.    Marland Kitchen HYDROcodone-acetaminophen (NORCO/VICODIN) 5-325 MG per tablet Take 1 tablet by mouth every 6 (six) hours as needed for moderate pain.    Marland Kitchen latanoprost (XALATAN) 0.005 % ophthalmic solution Place 1 drop into both eyes at bedtime.     Marland Kitchen levothyroxine (SYNTHROID, LEVOTHROID) 88 MCG tablet Take 88 mcg by mouth daily.    . metoprolol succinate (TOPROL-XL) 25 MG 24 hr tablet TAKE 1 TABLET BY MOUTH TWICE DAILY.    . Multiple Vitamins-Minerals (ICAPS) CAPS Take 1 capsule by mouth 2 (two) times daily.     . Omega-3 Fatty Acids (FISH OIL) 1000 MG CAPS Take 1,000 mg by mouth 4 (four) times daily - after meals and at bedtime.    Marland Kitchen PARoxetine (PAXIL) 20 MG tablet Take 1 tablet (20 mg total) by mouth daily. 90 tablet 3  . potassium chloride SA (K-DUR,KLOR-CON) 20 MEQ tablet Take 1 tablet (20 mEq total) by mouth 2 (two) times daily. 60 tablet 6  . rosuvastatin (CRESTOR) 40 MG tablet Take 40 mg by mouth daily.    Marland Kitchen TIKOSYN 125 MCG capsule TAKE (1) CAPSULE TWICE DAILY.(ALONG WITH A 250MCG CAPSULE) 60 capsule 5  . TIKOSYN 250 MCG capsule TAKE (1) CAPSULE TWICE DAILY.(TAKE ALONG WITH A 125MCG) 60 capsule 6  . traMADol (ULTRAM) 50 MG tablet Take 50 mg by mouth at bedtime as needed for moderate pain. For pain    . warfarin (COUMADIN) 5 MG tablet Take 2.5-5 mg by mouth daily. Take 1 tablet on Monday, Wednesday and Friday.  Take 0.5 tablet on Sunday, Tuesday, Thursday and Saturday.    Marland Kitchen ZIOPTAN 0.0015 % SOLN Place 1 drop into both eyes at bedtime.     Marland Kitchen zolmitriptan (ZOMIG) 5 MG tablet Take 5 mg by mouth daily as needed for migraine.      No current facility-administered medications for this encounter.    Allergies  Allergen Reactions  . Citalopram Hydrobromide Other (See Comments)    Headache and insomnia  . Trazodone And Nefazodone Other (See Comments)    Weakness.    Social History   Social History  . Marital  Status: Married    Spouse Name: N/A  . Number of Children: N/A  . Years of Education: N/A   Occupational History  . Not on file.   Social History Main Topics  . Smoking status: Former Smoker -- 1.00 packs/day for 24 years    Types: Cigarettes    Quit date: 07/15/1987  .  Smokeless tobacco: Never Used  . Alcohol Use: 8.4 oz/week    14 Glasses of wine per week     Comment: "probably 2, 4oz glasses of wine/night"  . Drug Use: No  . Sexual Activity: Not Currently   Other Topics Concern  . Not on file   Social History Narrative   Lives in [place] with spouse.  No children.  Retired Education officer, [employer].    Family History  Problem Relation Age of Onset  . Stroke Father   . Heart attack Father 24  . Heart disease Father   . Hyperlipidemia Father   . Hypertension Father   . Macular degeneration Mother   . Transient ischemic attack Mother     multiple  . Varicose Veins Mother   . Hypertension Brother     ROS-  All systems are reviewed and are negative except as outlined in the HPI above  Physical Exam: Filed Vitals:   05/03/15 1342  BP: 100/66  Pulse: 84  Height: 5\' 4"  (1.626 m)  Weight: 156 lb (70.761 kg)    GEN- The patient is well appearing, alert and oriented x 3 today.   Head- normocephalic, atraumatic Eyes-  Sclera clear, conjunctiva pink Ears- hearing intact Oropharynx- clear Neck- supple, no JVP Lymph- no cervical lymphadenopathy Lungs- Clear to ausculation bilaterally, normal work of breathing Heart- regular rate and rhythm, no murmurs, rubs or gallops, PMI not laterally displaced GI- soft, NT, ND, + BS Extremities- no clubbing, cyanosis, or edema MS- no significant deformity or atrophy Skin- no rash or lesion, + palpable purpura both feet and lower legs Psych- euthymic mood, full affect Neuro- strength and sensation are   EKG- NSR IRBBB, pr int 192 ms, qrs int 96 ms, qtc 496 ms/stable  Assessment and Plan:  1. Pesistent afib/ atypical atrial  flutter Appears to be staying in SR s/p ablation / Tikosyn.  Bmet.tsh/ mag today  2. Tachycardia/ bradycardia syndrome Remote check due 10/27  3. Valvular heart disease Stable No changes today   F/u afib clinic in 3 months, Dr. Rayann Heman as scheduled  Geroge Baseman. Adisson Deak, [facility] [address] [place], [place] [address] [phone]

## 2015-05-03 NOTE — Progress Notes (Signed)
Cardiology Office Note   Date:  05/03/2015   ID:  Jo Hughes, DOB 1941/01/24, MRN [id number]  PCP:  Irven Shelling, MD  Cardiologist:  Sinclair Grooms, MD   Chief Complaint  Patient presents with  . Cardiac Valve Problem  . Atrial Fibrillation      History of Present Illness: Jo Hughes is a 74 y.o. female who presents for remote mitral valve repair, paroxysmal atrial fibrillation, status post ablation, chronic dofetilide therapy, chronic anticoagulation therapy, chronic diastolic heart failure, and tachybradycardia syndrome with pacemaker therapy.  Excessive daytime sleepiness. She feels tired all the time. She has lower extremity swelling. She awakens frequently from sleep. She feels she may be snoring. She has not had syncope. She denies chest pain and prolonged palpitations. Increasing weight. Decreased energy.  Past Medical History  Diagnosis Date  . H/O mitral valve repair [facility] , [place].  . H/O tricuspid valve repair   . Hx of acquired endocarditis 1970; 1986    Prior to mitral valve repair   . Hyperlipidemia     cardiac cath clean coronary arties in the past.  . Glaucoma   . Macular degeneration     early , [place]  . Hypothyroid   . Atrial fibrillation ([place])     persistent  . Symptomatic bradycardia     s/p PPM  . Chronic diastolic CHF (congestive heart failure) ([place])   . Anxiety   . Thyroid nodule   . Long term (current) use of anticoagulants     No bleeding  . Kidney stone 10/2013    "pass it"  . 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 CAD (coronary artery disease)     Past Surgical History  Procedure Laterality Date  . Mitral valve repair  05/2004    [facility]  . Cardioversion  ~ 2011; 09/18/2011    ?; Procedure: CARDIOVERSION;  Surgeon: Sinclair Grooms, MD;  Location: [place];  Service: Cardiovascular;  Laterality: N/A;  . Abdominal hysterectomy  1986  . Knee cartilage surgery  Left 02/2009    scope; Dr. Berenice Primas  . Lumbar laminectomy  1997  . Tricuspid valve surgery  05/2004    "repair; [facility]"  . Back surgery    . Cataract extraction w/ intraocular lens  implant, bilateral  ~ 2011  . Cardiac catheterization  X 3  . Cardioversion N/A 04/29/2013    Procedure: CARDIOVERSION;  Surgeon: Sinclair Grooms, MD;  Location: [facility] ENDOSCOPY;  Service: Cardiovascular;  Laterality: N/A;  . Tee without cardioversion N/A 11/07/2013    Procedure: TRANSESOPHAGEAL ECHOCARDIOGRAM (TEE);  Surgeon: Pixie Casino, MD;  Location: [facility] ENDOSCOPY;  Service: Cardiovascular;  Laterality: N/A;  . Atrial fibrillation ablation  11/08/13    PVI by Dr Rayann Heman   . Insert / replace / remove pacemaker  01/13/2012    MDT Adapta L implanted by Dr Rayann Heman  . Dilation and curettage of uterus    . Tonsillectomy  1948  . Breast biopsy Left X 2    "benign"  . Permanent pacemaker insertion N/A 01/13/2012    Procedure: PERMANENT PACEMAKER INSERTION;  Surgeon: Thompson Grayer, MD;  Location: [facility] CATH LAB;  Service: Cardiovascular;  Laterality: N/A;  . Atrial fibrillation ablation N/A 11/08/2013    Procedure: ATRIAL FIBRILLATION ABLATION;  Surgeon: Coralyn Mark, MD;  Location: [facility] CATH LAB;  Service: Cardiovascular;  Laterality: N/A;  Current Outpatient Prescriptions  Medication Sig Dispense Refill  . ALPRAZolam (XANAX) 0.5 MG tablet Take 0.5 mg by mouth 2 (two) times daily as needed for anxiety.     Marland Kitchen amoxicillin (AMOXIL) 500 MG capsule Take four (4) capsules by mouth one (1) hour prior to dental procedures.    . Calcium Carbonate-Vitamin D (CALCIUM 600 + D PO) Take 1 tablet by mouth daily.     . celecoxib (CELEBREX) 200 MG capsule Take 200 mg by mouth 2 (two) times daily as needed for mild pain.    . COSOPT PF 22.3-6.8 MG/ML SOLN Place 1 drop into both eyes daily.    . cycloSPORINE (RESTASIS) 0.05 % ophthalmic emulsion Place 1 drop into both eyes 2 (two) times daily.     . dorzolamide-timolol  (COSOPT) 22.3-6.8 MG/ML ophthalmic solution Place 1 drop into both eyes 2 (two) times daily.    . furosemide (LASIX) 80 MG tablet Take 80 mg by mouth at bedtime.    Marland Kitchen HYDROcodone-acetaminophen (NORCO/VICODIN) 5-325 MG per tablet Take 1 tablet by mouth every 6 (six) hours as needed for moderate pain.    Marland Kitchen latanoprost (XALATAN) 0.005 % ophthalmic solution Place 1 drop into both eyes at bedtime.     Marland Kitchen levothyroxine (SYNTHROID, LEVOTHROID) 88 MCG tablet Take 88 mcg by mouth daily.    . metoprolol succinate (TOPROL-XL) 25 MG 24 hr tablet TAKE 1 TABLET BY MOUTH TWICE DAILY.    . Multiple Vitamins-Minerals (ICAPS) CAPS Take 1 capsule by mouth 2 (two) times daily.     . Omega-3 Fatty Acids (FISH OIL) 1000 MG CAPS Take 1,000 mg by mouth 4 (four) times daily - after meals and at bedtime.    Marland Kitchen PARoxetine (PAXIL) 20 MG tablet Take 1 tablet (20 mg total) by mouth daily. 90 tablet 3  . potassium chloride SA (K-DUR,KLOR-CON) 20 MEQ tablet Take 1 tablet (20 mEq total) by mouth 2 (two) times daily. 60 tablet 6  . rosuvastatin (CRESTOR) 40 MG tablet Take 40 mg by mouth daily.    Marland Kitchen TIKOSYN 125 MCG capsule TAKE (1) CAPSULE TWICE DAILY.(ALONG WITH A 250MCG CAPSULE) 60 capsule 5  . TIKOSYN 250 MCG capsule TAKE (1) CAPSULE TWICE DAILY.(TAKE ALONG WITH A 125MCG) 60 capsule 6  . traMADol (ULTRAM) 50 MG tablet Take 50 mg by mouth at bedtime as needed for moderate pain. For pain    . warfarin (COUMADIN) 5 MG tablet Take 2.5-5 mg by mouth daily. Take 1 tablet on Monday, Wednesday and Friday.  Take 0.5 tablet on Sunday, Tuesday, Thursday and Saturday.    Marland Kitchen ZIOPTAN 0.0015 % SOLN Place 1 drop into both eyes at bedtime.     Marland Kitchen zolmitriptan (ZOMIG) 5 MG tablet Take 5 mg by mouth daily as needed for migraine.      No current facility-administered medications for this visit.    Allergies:   Citalopram hydrobromide and Trazodone and nefazodone    Social History:  The patient  reports that she quit smoking about 27 years ago. Her  smoking use included Cigarettes. She has a 24 pack-year smoking history. She has never used smokeless tobacco. She reports that she drinks about 8.4 oz of alcohol per week. She reports that she does not use illicit drugs.   Family History:  The patient's family history includes Heart attack (age of onset: 73) in her father; Heart disease in her father; Hyperlipidemia in her father; Hypertension in her brother and father; Macular degeneration in her mother; Stroke in her  father; Transient ischemic attack in her mother; Varicose Veins in her mother.    ROS:  Please see the history of present illness.   Otherwise, review of systems are positive for shortness of breath, decreased visual acuity, lower extremity swelling, leg discomfort, back pain, muscle pain, and episodes of dizziness..   All other systems are reviewed and negative.    PHYSICAL EXAM: VS:  BP 120/64 mmHg  Pulse 67  Ht 5\' 4"  (1.626 m)  Wt 71.124 kg (156 lb 12.8 oz)  BMI 26.90 kg/m2  SpO2 96% , BMI Body mass index is 26.9 kg/(m^2). GEN: Well nourished, well developed, in no acute distress HEENT: normal Neck: no JVD, carotid bruits, or masses Cardiac: RRR.  There is no murmur, rub, or gallop. There is no edema. Mild discoloration and lichenification of skin is noted. Respiratory:  clear to auscultation bilaterally, normal work of breathing. GI: soft, nontender, nondistended, + BS MS: no deformity or atrophy Skin: warm and dry, no rash Neuro:  Strength and sensation are intact Psych: euthymic mood, full affect   EKG:  EKG is not ordered today.   Recent Labs: 02/28/2015: Magnesium 2.0 03/09/2015: BUN 17; Creatinine, Ser 0.88; Potassium 4.3; Sodium 135    Lipid Panel No results found for: CHOL, TRIG, HDL, CHOLHDL, VLDL, LDLCALC, LDLDIRECT    Wt Readings from Last 3 Encounters:  05/03/15 71.124 kg (156 lb 12.8 oz)  01/31/15 70.217 kg (154 lb 12.8 oz)  11/20/14 70.489 kg (155 lb 6.4 oz)      Other studies  Reviewed: Additional studies/ records that were reviewed today include: Reviewed laboratory data. Potassium and magnesium levels have been stable.. The findings include with the history of excessive daytime sleepiness, recurrent lower extremity edema, the diagnosis of possible sleep apnea is raised..    ASSESSMENT AND PLAN:  1. Paroxysmal atrial fibrillation (HCC) Well controlled on dofetilide  2. Chronic diastolic heart failure Asymptomatic and no evidence of volume overload  3. On dofetilide therapy No complications. Laboratory data is been checked and potassium and magnesium levels have been in order  4. Status post mitral valve repair No mitral regurgitation  5. Snoring and excessive daytime sleepiness Sleep study needs to be done to rule out obstructive sleep apnea  6. Pacemaker Normal function     Current medicines are reviewed at length with the patient today.  The patient has the following concerns regarding medicines: None.  The following changes/actions have been instituted:    Sleep study  Monitor magnesium and potassium at least 3 times per year and preferably every quarter  Labs/ tests ordered today include:  No orders of the defined types were placed in this encounter.     Disposition:   FU with HS in 1 year  Signed, Sinclair Grooms, MD  05/03/2015 11:27 AM    [address], [place], [place]  [address] Phone: [phone]; Fax: [phone]

## 2015-05-10 ENCOUNTER — Telehealth: Payer: Self-pay | Admitting: Cardiology

## 2015-05-10 ENCOUNTER — Ambulatory Visit (INDEPENDENT_AMBULATORY_CARE_PROVIDER_SITE_OTHER): Payer: Medicare Other | Admitting: *Deleted

## 2015-05-10 DIAGNOSIS — I495 Sick sinus syndrome: Secondary | ICD-10-CM | POA: Diagnosis not present

## 2015-05-10 NOTE — Telephone Encounter (Signed)
Spoke with pt and reminded pt of remote transmission that is due today. Pt verbalized understanding.   

## 2015-05-10 NOTE — Progress Notes (Signed)
Remote pacemaker transmission.   

## 2015-05-24 LAB — CUP PACEART REMOTE DEVICE CHECK
Brady Statistic AP VS Percent: 75 %
Brady Statistic AS VP Percent: 0 %
Implantable Lead Implant Date: 20130702
Implantable Lead Location: 753859
Lead Channel Impedance Value: 567 Ohm
Lead Channel Impedance Value: 691 Ohm
Lead Channel Pacing Threshold Pulse Width: 0.4 ms
Lead Channel Sensing Intrinsic Amplitude: 0.7 mV
Lead Channel Sensing Intrinsic Amplitude: 11.2 mV
Lead Channel Setting Pacing Pulse Width: 0.76 ms
MDC IDC LEAD IMPLANT DT: 20130702
MDC IDC LEAD LOCATION: 753860
MDC IDC MSMT BATTERY IMPEDANCE: 257 Ohm
MDC IDC MSMT BATTERY REMAINING LONGEVITY: 115 mo
MDC IDC MSMT BATTERY VOLTAGE: 2.78 V
MDC IDC MSMT LEADCHNL RA PACING THRESHOLD AMPLITUDE: 0.625 V
MDC IDC SESS DTM: 20161027154755
MDC IDC SET LEADCHNL RA PACING AMPLITUDE: 2 V
MDC IDC SET LEADCHNL RV PACING AMPLITUDE: 2.5 V
MDC IDC SET LEADCHNL RV SENSING SENSITIVITY: 5.6 mV
MDC IDC STAT BRADY AP VP PERCENT: 0 %
MDC IDC STAT BRADY AS VS PERCENT: 25 %

## 2015-05-25 ENCOUNTER — Encounter: Payer: Self-pay | Admitting: Cardiology

## 2015-05-25 ENCOUNTER — Ambulatory Visit (INDEPENDENT_AMBULATORY_CARE_PROVIDER_SITE_OTHER): Payer: Medicare Other | Admitting: *Deleted

## 2015-05-25 DIAGNOSIS — I4891 Unspecified atrial fibrillation: Secondary | ICD-10-CM

## 2015-05-25 LAB — POCT INR: INR: 3.7

## 2015-05-27 ENCOUNTER — Other Ambulatory Visit: Payer: Self-pay | Admitting: Internal Medicine

## 2015-05-28 ENCOUNTER — Other Ambulatory Visit: Payer: Self-pay | Admitting: Internal Medicine

## 2015-05-28 MED ORDER — WARFARIN SODIUM 5 MG PO TABS: 5.0000 mg | ORAL_TABLET | ORAL | Status: DC

## 2015-05-28 NOTE — Telephone Encounter (Signed)
PCP should refill.

## 2015-05-30 ENCOUNTER — Other Ambulatory Visit: Payer: Self-pay | Admitting: Internal Medicine

## 2015-06-13 ENCOUNTER — Other Ambulatory Visit: Payer: Self-pay | Admitting: Internal Medicine

## 2015-06-13 ENCOUNTER — Ambulatory Visit (INDEPENDENT_AMBULATORY_CARE_PROVIDER_SITE_OTHER): Payer: Medicare Other | Admitting: Pharmacist

## 2015-06-13 DIAGNOSIS — I4891 Unspecified atrial fibrillation: Secondary | ICD-10-CM

## 2015-06-13 LAB — POCT INR: INR: 3.2

## 2015-06-14 ENCOUNTER — Other Ambulatory Visit: Payer: Self-pay

## 2015-06-14 MED ORDER — PAROXETINE HCL 20 MG PO TABS: 20.0000 mg | ORAL_TABLET | Freq: Every day | ORAL | Status: AC

## 2015-06-21 ENCOUNTER — Other Ambulatory Visit: Payer: Self-pay | Admitting: Internal Medicine

## 2015-06-27 ENCOUNTER — Ambulatory Visit (INDEPENDENT_AMBULATORY_CARE_PROVIDER_SITE_OTHER): Payer: Medicare Other | Admitting: *Deleted

## 2015-06-27 DIAGNOSIS — I480 Paroxysmal atrial fibrillation: Secondary | ICD-10-CM | POA: Diagnosis not present

## 2015-06-27 LAB — POCT INR: INR: 2.2

## 2015-07-11 ENCOUNTER — Other Ambulatory Visit: Payer: Self-pay | Admitting: Interventional Cardiology

## 2015-07-18 ENCOUNTER — Ambulatory Visit (INDEPENDENT_AMBULATORY_CARE_PROVIDER_SITE_OTHER): Payer: Medicare Other | Admitting: Pharmacist

## 2015-07-18 DIAGNOSIS — I480 Paroxysmal atrial fibrillation: Secondary | ICD-10-CM

## 2015-07-18 LAB — POCT INR: INR: 3.4

## 2015-07-31 ENCOUNTER — Ambulatory Visit (HOSPITAL_BASED_OUTPATIENT_CLINIC_OR_DEPARTMENT_OTHER): Payer: Medicare Other | Attending: Cardiology

## 2015-07-31 VITALS — Ht 64.0 in | Wt 147.0 lb

## 2015-07-31 DIAGNOSIS — G4733 Obstructive sleep apnea (adult) (pediatric): Secondary | ICD-10-CM | POA: Insufficient documentation

## 2015-07-31 DIAGNOSIS — I471 Supraventricular tachycardia: Secondary | ICD-10-CM | POA: Diagnosis not present

## 2015-07-31 DIAGNOSIS — Z79899 Other long term (current) drug therapy: Secondary | ICD-10-CM | POA: Diagnosis not present

## 2015-07-31 DIAGNOSIS — R0683 Snoring: Secondary | ICD-10-CM | POA: Diagnosis present

## 2015-08-02 ENCOUNTER — Ambulatory Visit (HOSPITAL_COMMUNITY): Payer: Medicare Other | Admitting: Nurse Practitioner

## 2015-08-06 ENCOUNTER — Encounter (HOSPITAL_COMMUNITY): Payer: Self-pay | Admitting: Nurse Practitioner

## 2015-08-06 ENCOUNTER — Ambulatory Visit (HOSPITAL_COMMUNITY)
Admission: RE | Admit: 2015-08-06 | Discharge: 2015-08-06 | Disposition: A | Discharge status: Home / Self Care | Payer: Medicare Other | Source: Ambulatory Visit | Attending: Nurse Practitioner | Admitting: Nurse Practitioner

## 2015-08-06 VITALS — BP 112/72 | HR 90 | Ht 64.0 in | Wt 152.8 lb

## 2015-08-06 DIAGNOSIS — Z87891 Personal history of nicotine dependence: Secondary | ICD-10-CM | POA: Diagnosis not present

## 2015-08-06 DIAGNOSIS — E039 Hypothyroidism, unspecified: Secondary | ICD-10-CM | POA: Diagnosis not present

## 2015-08-06 DIAGNOSIS — I2510 Atherosclerotic heart disease of native coronary artery without angina pectoris: Secondary | ICD-10-CM | POA: Insufficient documentation

## 2015-08-06 DIAGNOSIS — Z888 Allergy status to other drugs, medicaments and biological substances status: Secondary | ICD-10-CM | POA: Diagnosis not present

## 2015-08-06 DIAGNOSIS — E785 Hyperlipidemia, unspecified: Secondary | ICD-10-CM | POA: Insufficient documentation

## 2015-08-06 DIAGNOSIS — Z7901 Long term (current) use of anticoagulants: Secondary | ICD-10-CM | POA: Diagnosis not present

## 2015-08-06 DIAGNOSIS — H409 Unspecified glaucoma: Secondary | ICD-10-CM | POA: Insufficient documentation

## 2015-08-06 DIAGNOSIS — Z79899 Other long term (current) drug therapy: Secondary | ICD-10-CM | POA: Insufficient documentation

## 2015-08-06 DIAGNOSIS — I4891 Unspecified atrial fibrillation: Secondary | ICD-10-CM | POA: Insufficient documentation

## 2015-08-06 DIAGNOSIS — I4819 Other persistent atrial fibrillation: Secondary | ICD-10-CM

## 2015-08-06 DIAGNOSIS — I481 Persistent atrial fibrillation: Secondary | ICD-10-CM | POA: Diagnosis not present

## 2015-08-06 DIAGNOSIS — Z8249 Family history of ischemic heart disease and other diseases of the circulatory system: Secondary | ICD-10-CM | POA: Insufficient documentation

## 2015-08-06 DIAGNOSIS — H3530 Unspecified macular degeneration: Secondary | ICD-10-CM | POA: Insufficient documentation

## 2015-08-06 DIAGNOSIS — Z823 Family history of stroke: Secondary | ICD-10-CM | POA: Diagnosis not present

## 2015-08-06 LAB — BASIC METABOLIC PANEL
ANION GAP: 8 (ref 5–15)
BUN: 14 mg/dL (ref 6–20)
CO2: 29 mmol/L (ref 22–32)
CREATININE: 0.8 mg/dL (ref 0.44–1.00)
Calcium: 10.9 mg/dL — ABNORMAL HIGH (ref 8.9–10.3)
Chloride: 105 mmol/L (ref 101–111)
GFR calc non Af Amer: >60 mL/min (ref >60)
Glucose, Bld: 108 mg/dL — ABNORMAL HIGH (ref 65–99)
POTASSIUM: 4.8 mmol/L (ref 3.5–5.1)
SODIUM: 142 mmol/L (ref 135–145)

## 2015-08-06 LAB — MAGNESIUM: MAGNESIUM: 2.2 mg/dL (ref 1.7–2.4)

## 2015-08-06 NOTE — Progress Notes (Signed)
Patient ID: Jo Hughes, female   DOB: 08/06/1940, 75 y.o.   MRN: [id number]     Primary Care Physician: Irven Shelling, MD Referring Physician: Dr. Selinda Orion is a 75 y.o. female with a h/o persistent afib s/p ablation 4/15, on tikosyn for f/u. She reports that she has not noticed any irregular heartbeat. Feels well. Did have a sleep study recently, has not been reported out yet. She thinks she sleeps poorly due to pain from orthopedic issues, not necessarily snoring. Being compliant with tiksoyn and warfarin  Today, she denies symptoms of palpitations, chest pain, shortness of breath, orthopnea, PND, lower extremity edema, dizziness, presyncope, syncope, or neurologic sequela. The patient is tolerating medications without difficulties and is otherwise without complaint today.   Past Medical History  Diagnosis Date  . H/O mitral valve repair [facility] [place].  . H/O tricuspid valve repair   . Hx of acquired endocarditis 1970; 1986    Prior to mitral valve repair   . Hyperlipidemia     cardiac cath clean coronary arties in the past.  . Glaucoma   . Macular degeneration     early , [place]  . Hypothyroid   . Atrial fibrillation ([place])     persistent  . Symptomatic bradycardia     s/p PPM  . Chronic diastolic CHF (congestive heart failure) ([place])   . Anxiety   . Thyroid nodule   . Long term (current) use of anticoagulants     No bleeding  . Kidney stone 10/2013    "pass it"  . 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 CAD (coronary artery disease)    Past Surgical History  Procedure Laterality Date  . Mitral valve repair  05/2004    [facility]  . Cardioversion  ~ 2011; 09/18/2011    ?; Procedure: CARDIOVERSION;  Surgeon: Sinclair Grooms, MD;  Location: [place];  Service: Cardiovascular;  Laterality: N/A;  . Abdominal hysterectomy  1986  . Knee cartilage surgery Left 02/2009    scope; Dr. Berenice Primas  . Lumbar  laminectomy  1997  . Tricuspid valve surgery  05/2004    "repair; [facility]"  . Back surgery    . Cataract extraction w/ intraocular lens  implant, bilateral  ~ 2011  . Cardiac catheterization  X 3  . Cardioversion N/A 04/29/2013    Procedure: CARDIOVERSION;  Surgeon: Sinclair Grooms, MD;  Location: [facility] ENDOSCOPY;  Service: Cardiovascular;  Laterality: N/A;  . Tee without cardioversion N/A 11/07/2013    Procedure: TRANSESOPHAGEAL ECHOCARDIOGRAM (TEE);  Surgeon: Pixie Casino, MD;  Location[facility] ENDOSCOPY;  Service: Cardiovascular;  Laterality: N/A;  . Atrial fibrillation ablation  11/08/13    PVI by Dr Rayann Heman   . Insert / replace / remove pacemaker  01/13/2012    MDT Adapta L implanted by Dr Rayann Heman  . Dilation and curettage of uterus    . Tonsillectomy  1948  . Breast biopsy Left X 2    "benign"  . Permanent pacemaker insertion N/A 01/13/2012    Procedure: PERMANENT PACEMAKER INSERTION;  Surgeon: Thompson Grayer, MD;  Location: [facility] CATH LAB;  Service: Cardiovascular;  Laterality: N/A;  . Atrial fibrillation ablation N/A 11/08/2013    Procedure: ATRIAL FIBRILLATION ABLATION;  Surgeon: Coralyn Mark, MD;  Location: [place] CATH LAB;  Service: Cardiovascular;  Laterality: N/A;    Current Outpatient Prescriptions  Medication Sig  Dispense Refill  . ALPRAZolam (XANAX) 0.5 MG tablet Take 0.5 mg by mouth 2 (two) times daily as needed for anxiety.     Marland Kitchen amoxicillin (AMOXIL) 500 MG capsule Take four (4) capsules by mouth one (1) hour prior to dental procedures.    . Calcium Carbonate-Vitamin D (CALCIUM 600 + D PO) Take 1 tablet by mouth daily.     . celecoxib (CELEBREX) 200 MG capsule Take 200 mg by mouth 2 (two) times daily as needed for mild pain.    . COSOPT PF 22.3-6.8 MG/ML SOLN Place 1 drop into both eyes daily.    . cycloSPORINE (RESTASIS) 0.05 % ophthalmic emulsion Place 1 drop into both eyes 2 (two) times daily.     Marland Kitchen dofetilide (TIKOSYN) 250 MCG capsule Take 1 capsule (250 mcg total) by  mouth 2 (two) times daily. (Take along with a 164mcg) 60 capsule 6  . dorzolamide-timolol (COSOPT) 22.3-6.8 MG/ML ophthalmic solution Place 1 drop into both eyes 2 (two) times daily.    . furosemide (LASIX) 80 MG tablet Take 80 mg by mouth at bedtime.    Marland Kitchen HYDROcodone-acetaminophen (NORCO/VICODIN) 5-325 MG per tablet Take 1 tablet by mouth every 6 (six) hours as needed for moderate pain.    Marland Kitchen latanoprost (XALATAN) 0.005 % ophthalmic solution Place 1 drop into both eyes at bedtime.     Marland Kitchen levothyroxine (SYNTHROID, LEVOTHROID) 88 MCG tablet Take 88 mcg by mouth daily.    . metoprolol succinate (TOPROL-XL) 25 MG 24 hr tablet TAKE 2 TABLETS DAILY. 180 tablet 2  . Multiple Vitamins-Minerals (ICAPS) CAPS Take 1 capsule by mouth 2 (two) times daily.     . Omega-3 Fatty Acids (FISH OIL) 1000 MG CAPS Take 1,000 mg by mouth 4 (four) times daily - after meals and at bedtime.    Marland Kitchen PARoxetine (PAXIL) 20 MG tablet Take 1 tablet (20 mg total) by mouth daily. 90 tablet 3  . potassium chloride SA (K-DUR,KLOR-CON) 20 MEQ tablet Take 1 tablet (20 mEq total) by mouth 2 (two) times daily. 60 tablet 6  . rosuvastatin (CRESTOR) 40 MG tablet Take 40 mg by mouth daily.    Marland Kitchen TIKOSYN 125 MCG capsule TAKE (1) CAPSULE TWICE DAILY.(ALONG WITH A 250MCG CAPSULE) 60 capsule 5  . traMADol (ULTRAM) 50 MG tablet Take 50 mg by mouth at bedtime as needed for moderate pain. For pain    . warfarin (COUMADIN) 5 MG tablet Take 1 tablet (5 mg total) by mouth as directed. 40 tablet 3  . ZIOPTAN 0.0015 % SOLN Place 1 drop into both eyes at bedtime.     Marland Kitchen zolmitriptan (ZOMIG) 5 MG tablet Take 5 mg by mouth daily as needed for migraine.      No current facility-administered medications for this encounter.    Allergies  Allergen Reactions  . Citalopram Hydrobromide Other (See Comments)    Headache and insomnia  . Trazodone And Nefazodone Other (See Comments)    Weakness.    Social History   Social History  . Marital Status: Married      Spouse Name: N/A  . Number of Children: N/A  . Years of Education: N/A   Occupational History  . Not on file.   Social History Main Topics  . Smoking status: Former Smoker -- 1.00 packs/day for 24 years    Types: Cigarettes    Quit date: 07/15/1987  . Smokeless tobacco: Never Used  . Alcohol Use: 8.4 oz/week    14 Glasses of wine per  week     Comment: "probably 2, 4oz glasses of wine/night"  . Drug Use: No  . Sexual Activity: Not Currently   Other Topics Concern  . Not on file   Social History Narrative   Lives in [place] with spouse.  No children.  Retired Education officer, museum.    Family History  Problem Relation Age of Onset  . Stroke Father   . Heart attack Father 29  . Heart disease Father   . Hyperlipidemia Father   . Hypertension Father   . Macular degeneration Mother   . Transient ischemic attack Mother     multiple  . Varicose Veins Mother   . Hypertension Brother     ROS- All systems are reviewed and negative except as per the HPI above  Physical Exam: Filed Vitals:   08/06/15 1416  BP: 112/72  Pulse: 90  Height: 5\' 4"  (1.626 m)  Weight: 152 lb 12.8 oz (69.31 kg)    GEN- The patient is well appearing, alert and oriented x 3 today.   Head- normocephalic, atraumatic Eyes-  Sclera clear, conjunctiva pink Ears- hearing intact Oropharynx- clear Neck- supple, no JVP Lymph- no cervical lymphadenopathy Lungs- Clear to ausculation bilaterally, normal work of breathing Heart- Regular rate and rhythm, no murmurs, rubs or gallops, PMI not laterally displaced GI- soft, NT, ND, + BS Extremities- no clubbing, cyanosis, or edema MS- no significant deformity or atrophy Skin- no rash or lesion Psych- euthymic mood, full affect Neuro- strength and sensation are intact  EKG- Sinus rhythm 90 bpm, Pr int 188 ms, QRS int 94 ms, qtc 496 ms(stable) Epic records reviewed  Assessment and Plan: 1. afib Remaining in SR on tikosyn,  Continue metoprolol Continue  warfain Bmet/mag today  F/u in afib clinic 3 months  Butch Penny C. Carroll, [facility] [address] [place], [place] [address] [phone]

## 2015-08-09 ENCOUNTER — Telehealth: Payer: Self-pay | Admitting: Cardiology

## 2015-08-09 ENCOUNTER — Ambulatory Visit (INDEPENDENT_AMBULATORY_CARE_PROVIDER_SITE_OTHER): Payer: Medicare Other | Admitting: *Deleted

## 2015-08-09 DIAGNOSIS — I495 Sick sinus syndrome: Secondary | ICD-10-CM | POA: Diagnosis not present

## 2015-08-09 NOTE — Telephone Encounter (Signed)
Spoke with pt and reminded pt of remote transmission that is due today. Pt verbalized understanding.   

## 2015-08-09 NOTE — Progress Notes (Signed)
Remote pacemaker transmission.   

## 2015-08-14 ENCOUNTER — Telehealth: Payer: Self-pay | Admitting: Cardiology

## 2015-08-14 ENCOUNTER — Encounter (HOSPITAL_BASED_OUTPATIENT_CLINIC_OR_DEPARTMENT_OTHER): Payer: Self-pay

## 2015-08-14 DIAGNOSIS — G4733 Obstructive sleep apnea (adult) (pediatric): Secondary | ICD-10-CM

## 2015-08-14 HISTORY — DX: Obstructive sleep apnea (adult) (pediatric): G47.33

## 2015-08-14 NOTE — Sleep Study (Cosign Needed)
   Patient Name: Jo Hughes, Jo Hughes MRN: [id number] Study Date: 07/31/2015 Gender: Female D.O.B: 1941-01-28 Age (years): 58 Referring Provider: Fransico Him MD, ABSM Interpreting Physician: Fransico Him MD, ABSM RPSGT: Madelon Lips Weight (lbs): 151 Height (inches): 64 BMI: 26 Neck Size: 14.50  CLINICAL INFORMATION Sleep Study Type: NPSG Indication for sleep study: Snoring Epworth Sleepiness Score: 14  SLEEP STUDY TECHNIQUE As per the AASM Manual for the Scoring of Sleep and Associated Events v2.3 (April 2016) with a hypopnea requiring 4% desaturations. The channels recorded and monitored were frontal, central and occipital EEG, electrooculogram (EOG), submentalis EMG (chin), nasal and oral airflow, thoracic and abdominal wall motion, anterior tibialis EMG, snore microphone, electrocardiogram, and pulse oximetry.  MEDICATIONS Patient's medications include: Xanax, Calcium and Vit D, Celebrex, Cosopt eye gtts, Restasis eye gtts, Tikosyn, Lasix, Norco, Xalatan eye gtts, Synthroid, Toprol, Kdur, Crestor, Ultram, Coumadin, Zomig. Medications self-administered by patient during sleep study : No sleep medicine administered.  SLEEP ARCHITECTURE The study was initiated at 10:30:27 PM and ended at 4:34:35 AM. Sleep onset time was 14.1 minutes and the sleep efficiency was reduced at 69.1%. The total sleep time was prolonged at 251.5 minutes. Stage REM latency was delayed at 131.0 minutes. The patient spent 15.11% of the night in stage N1 sleep, 73.96% in stage N2 sleep, 0.00% in stage N3 and 10.93% in REM. Alpha intrusion was absent. Supine sleep was 49.97%.  RESPIRATORY PARAMETERS The overall apnea/hypopnea index (AHI) was 7.2 per hour. There were 1 total apneas, including 0 obstructive, 0 central and 1 mixed apneas. There were 29 hypopneas and 8 RERAs. The AHI during Stage REM sleep was 0.0 per hour. AHI while supine was 9.5 per hour. The mean oxygen saturation was 92.48%. The minimum SpO2  during sleep was 87.00%.  The total time spent with oxygen saturations < 88% was 1.7 minutes. Moderate snoring was noted during this study.  CARDIAC DATA The 2 lead EKG demonstrated sinus rhythm. The mean heart rate was 60.87 beats per minute. Other EKG findings include: 4 beat run of nonsustained atrial tachycardia  LEG MOVEMENT DATA The total PLMS were 0 with a resulting PLMS index of 0.00. Associated arousal with leg movement index was 0.0 .  IMPRESSIONS - Mild obstructive sleep apnea occurred during this study (AHI = 7.2/h). - No significant central sleep apnea occurred during this study (CAI = 0.0/h). - Mild oxygen desaturation was noted during this study (Min O2 = 87.00%). - The patient snored with Moderate snoring volume. - A 4 beat run of nonsustained atrial tachycardia was noted.   - Clinically significant periodic limb movements did not occur during sleep. No significant associated arousals.  DIAGNOSIS - Obstructive Sleep Apnea (327.23 [G47.33 ICD-10])  RECOMMENDATIONS - Positional therapy avoiding supine position during sleep. - Very mild obstructive sleep apnea. Return to discuss treatment options. - Avoid alcohol, sedatives and other CNS depressants that may worsen sleep apnea and disrupt normal sleep architecture. - Sleep hygiene should be reviewed to assess factors that may improve sleep quality. - Weight management and regular exercise should be initiated or continued if appropriate.  Sueanne Margarita Diplomate, American Board of Sleep Medicine  ELECTRONICALLY SIGNED ON:  08/14/2015, 1:12 PM [place] PH: [phone]   FX: [phone] [place]

## 2015-08-14 NOTE — Telephone Encounter (Signed)
Patient has very mild OSA - set up OV to discuss treatment options.

## 2015-08-20 NOTE — Telephone Encounter (Signed)
Patient informed of results. Stated verbal understanding.   Appointment to discuss OSA treatment has been made for 10/19/15 10:45am

## 2015-08-21 ENCOUNTER — Ambulatory Visit (INDEPENDENT_AMBULATORY_CARE_PROVIDER_SITE_OTHER): Payer: Medicare Other | Admitting: *Deleted

## 2015-08-21 DIAGNOSIS — I481 Persistent atrial fibrillation: Secondary | ICD-10-CM | POA: Diagnosis not present

## 2015-08-21 DIAGNOSIS — I480 Paroxysmal atrial fibrillation: Secondary | ICD-10-CM | POA: Diagnosis not present

## 2015-08-21 DIAGNOSIS — I4819 Other persistent atrial fibrillation: Secondary | ICD-10-CM

## 2015-08-21 LAB — POCT INR: INR: 2

## 2015-08-22 LAB — CUP PACEART REMOTE DEVICE CHECK
Battery Impedance: 281 Ohm
Battery Remaining Longevity: 111 mo
Brady Statistic AP VP Percent: 0 %
Brady Statistic AP VS Percent: 76 %
Brady Statistic AS VS Percent: 24 %
Date Time Interrogation Session: 20170126153849
Implantable Lead Implant Date: 20130702
Implantable Lead Location: 753859
Implantable Lead Model: 5092
Lead Channel Impedance Value: 549 Ohm
Lead Channel Impedance Value: 727 Ohm
Lead Channel Pacing Threshold Amplitude: 0.625 V
Lead Channel Pacing Threshold Pulse Width: 0.4 ms
Lead Channel Setting Pacing Amplitude: 2 V
Lead Channel Setting Pacing Amplitude: 2.5 V
Lead Channel Setting Sensing Sensitivity: 4 mV
MDC IDC LEAD IMPLANT DT: 20130702
MDC IDC LEAD LOCATION: 753860
MDC IDC MSMT BATTERY VOLTAGE: 2.78 V
MDC IDC MSMT LEADCHNL RA SENSING INTR AMPL: 0.7 mV
MDC IDC MSMT LEADCHNL RV SENSING INTR AMPL: 11.2 mV
MDC IDC SET LEADCHNL RV PACING PULSEWIDTH: 0.76 ms
MDC IDC STAT BRADY AS VP PERCENT: 0 %

## 2015-08-24 ENCOUNTER — Encounter: Payer: Self-pay | Admitting: Cardiology

## 2015-08-31 ENCOUNTER — Other Ambulatory Visit: Payer: Self-pay | Admitting: Internal Medicine

## 2015-08-31 DIAGNOSIS — Z1231 Encounter for screening mammogram for malignant neoplasm of breast: Secondary | ICD-10-CM

## 2015-09-09 ENCOUNTER — Other Ambulatory Visit: Payer: Self-pay | Admitting: Internal Medicine

## 2015-09-19 ENCOUNTER — Ambulatory Visit (INDEPENDENT_AMBULATORY_CARE_PROVIDER_SITE_OTHER): Payer: Medicare Other | Admitting: *Deleted

## 2015-09-19 DIAGNOSIS — I480 Paroxysmal atrial fibrillation: Secondary | ICD-10-CM

## 2015-09-19 DIAGNOSIS — I481 Persistent atrial fibrillation: Secondary | ICD-10-CM

## 2015-09-19 DIAGNOSIS — I4819 Other persistent atrial fibrillation: Secondary | ICD-10-CM

## 2015-09-19 LAB — POCT INR: INR: 2.7

## 2015-09-24 ENCOUNTER — Ambulatory Visit
Admission: RE | Admit: 2015-09-24 | Discharge: 2015-09-24 | Disposition: A | Discharge status: Home / Self Care | Payer: Medicare Other | Source: Ambulatory Visit | Attending: Internal Medicine | Admitting: Internal Medicine

## 2015-09-24 DIAGNOSIS — Z1231 Encounter for screening mammogram for malignant neoplasm of breast: Secondary | ICD-10-CM

## 2015-10-03 ENCOUNTER — Other Ambulatory Visit: Payer: Self-pay | Admitting: Interventional Cardiology

## 2015-10-04 NOTE — Telephone Encounter (Signed)
REFILL 

## 2015-10-19 ENCOUNTER — Ambulatory Visit: Payer: Medicare Other | Admitting: Cardiology

## 2015-10-26 ENCOUNTER — Encounter: Payer: Self-pay | Admitting: Cardiology

## 2015-10-26 ENCOUNTER — Ambulatory Visit (INDEPENDENT_AMBULATORY_CARE_PROVIDER_SITE_OTHER): Payer: Medicare Other | Admitting: Pharmacist

## 2015-10-26 DIAGNOSIS — I480 Paroxysmal atrial fibrillation: Secondary | ICD-10-CM

## 2015-10-26 DIAGNOSIS — I481 Persistent atrial fibrillation: Secondary | ICD-10-CM | POA: Diagnosis not present

## 2015-10-26 DIAGNOSIS — I4819 Other persistent atrial fibrillation: Secondary | ICD-10-CM

## 2015-10-26 LAB — POCT INR: INR: 3.6

## 2015-11-05 ENCOUNTER — Encounter: Payer: Self-pay | Admitting: Internal Medicine

## 2015-11-05 ENCOUNTER — Ambulatory Visit (INDEPENDENT_AMBULATORY_CARE_PROVIDER_SITE_OTHER): Payer: Medicare Other | Admitting: Internal Medicine

## 2015-11-05 VITALS — BP 102/70 | HR 87 | Ht 64.0 in | Wt 146.6 lb

## 2015-11-05 DIAGNOSIS — I481 Persistent atrial fibrillation: Secondary | ICD-10-CM | POA: Diagnosis not present

## 2015-11-05 DIAGNOSIS — I495 Sick sinus syndrome: Secondary | ICD-10-CM | POA: Diagnosis not present

## 2015-11-05 DIAGNOSIS — R001 Bradycardia, unspecified: Secondary | ICD-10-CM

## 2015-11-05 DIAGNOSIS — I4819 Other persistent atrial fibrillation: Secondary | ICD-10-CM

## 2015-11-05 LAB — CUP PACEART INCLINIC DEVICE CHECK
Battery Impedance: 281 Ohm
Battery Remaining Longevity: 111 mo
Battery Voltage: 2.77 V
Brady Statistic AP VS Percent: 77 %
Brady Statistic AS VP Percent: 0 %
Date Time Interrogation Session: 20170424160340
Implantable Lead Implant Date: 20130702
Implantable Lead Location: 753860
Implantable Lead Model: 5076
Implantable Lead Model: 5092
Lead Channel Impedance Value: 678 Ohm
Lead Channel Pacing Threshold Amplitude: 0.75 V
Lead Channel Pacing Threshold Pulse Width: 0.4 ms
Lead Channel Pacing Threshold Pulse Width: 0.4 ms
MDC IDC LEAD IMPLANT DT: 20130702
MDC IDC LEAD LOCATION: 753859
MDC IDC MSMT LEADCHNL RA IMPEDANCE VALUE: 524 Ohm
MDC IDC MSMT LEADCHNL RA PACING THRESHOLD AMPLITUDE: 0.625 V
MDC IDC MSMT LEADCHNL RV PACING THRESHOLD AMPLITUDE: 0.5 V
MDC IDC MSMT LEADCHNL RV PACING THRESHOLD PULSEWIDTH: 0.76 ms
MDC IDC MSMT LEADCHNL RV SENSING INTR AMPL: 11.2 mV
MDC IDC SET LEADCHNL RA PACING AMPLITUDE: 2 V
MDC IDC SET LEADCHNL RV PACING AMPLITUDE: 2.5 V
MDC IDC SET LEADCHNL RV PACING PULSEWIDTH: 0.76 ms
MDC IDC SET LEADCHNL RV SENSING SENSITIVITY: 4 mV
MDC IDC STAT BRADY AP VP PERCENT: 0 %
MDC IDC STAT BRADY AS VS PERCENT: 22 %

## 2015-11-05 NOTE — Patient Instructions (Addendum)
Medication Instructions:  Your physician recommends that you continue on your current medications as directed. Please refer to the Current Medication list given to you today.   Labwork: None ordered   Testing/Procedures: None ordered   Follow-Up:  Your physician recommends that you schedule a follow-up appointment in: 3 months with Roderic Palau, NP  Your physician wants you to follow-up in: 12 months with Dr Vallery [place] will receive a reminder letter in the mail two months in advance. If you don't receive a letter, please call our office to schedule the follow-up appointment.  Remote monitoring is used to monitor your Pacemaker  from home. This monitoring reduces the number of office visits required to check your device to one time per year. It allows [place] to keep an eye on the functioning of your device to ensure it is working properly. You are scheduled for a device check from home on 02/04/16. You may send your transmission at any time that day. If you have a wireless device, the transmission will be sent automatically. After your physician reviews your transmission, you will receive a postcard with your next transmission date.     Any Other Special Instructions Will Be Listed Below (If Applicable).     If you need a refill on your cardiac medications before your next appointment, please call your pharmacy.

## 2015-11-05 NOTE — Progress Notes (Signed)
PCP:  Irven Shelling, MD Primary Cardiologist:  Dr Tamala Julian  The patient presents today for routine electrophysiology followup.  She is maintaining sinus with tikosyn.  She still has some fatigue.  Today, she denies symptoms of chest pain, shortness of breath, orthopnea, PND, lower extremity edema, dizziness, presyncope, syncope, or neurologic sequela. Positive for back and leg pain which seems to be worse in a lying position. The patient feels that she is  tolerating medications without difficulties and is otherwise without complaint today.   Past Medical History  Diagnosis Date  . H/O mitral valve repair [facility] [place].  . H/O tricuspid valve repair   . Hx of acquired endocarditis 1970; 1986    Prior to mitral valve repair   . Hyperlipidemia     cardiac cath clean coronary arties in the past.  . Glaucoma   . Macular degeneration     early , [place]  . Hypothyroid   . Atrial fibrillation ([place])     persistent  . Symptomatic bradycardia     s/p PPM  . Chronic diastolic CHF (congestive heart failure) ([place])   . Anxiety   . Thyroid nodule   . Long term (current) use of anticoagulants     No bleeding  . Kidney stone 10/2013    "pass it"  . 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 CAD (coronary artery disease)   . OSA (obstructive sleep apnea) 08/14/2015    Mild with AHI 7/hr   Past Surgical History  Procedure Laterality Date  . Mitral valve repair  05/2004    [facility]  . Cardioversion  ~ 2011; 09/18/2011    ?; Procedure: CARDIOVERSION;  Surgeon: Sinclair Grooms, MD;  Location: [place];  Service: Cardiovascular;  Laterality: N/A;  . Abdominal hysterectomy  1986  . Knee cartilage surgery Left 02/2009    scope; Dr. Berenice Primas  . Lumbar laminectomy  1997  . Tricuspid valve surgery  05/2004    "repair; [facility]"  . Back surgery    . Cataract extraction w/ intraocular lens  implant, bilateral  ~ 2011  . Cardiac  catheterization  X 3  . Cardioversion N/A 04/29/2013    Procedure: CARDIOVERSION;  Surgeon: Sinclair Grooms, MD;  Location: [facility] ENDOSCOPY;  Service: Cardiovascular;  Laterality: N/A;  . Tee without cardioversion N/A 11/07/2013    Procedure: TRANSESOPHAGEAL ECHOCARDIOGRAM (TEE);  Surgeon: Pixie Casino, MD;  Location[facility] END[facility]COPY;  Service: Cardiovascular;  Laterality: N/A;  . Atrial fibrillation ablation  11/08/13    PVI by Dr Rayann Heman   . Insert / replace / remove pacemaker  01/13/2012    MDT Adapta L implanted by Dr Rayann Heman  . Dilation and curettage of uterus    . Tonsillectomy  1948  . Breast biopsy Left X 2    "benign"  . Permanent pacemaker insertion N/A 01/13/2012    Procedure: PERMANENT PACEMAKER INSERTION;  Surgeon: Thompson Grayer, MD;  Location: [facility] CATH LAB;  Service: Cardiovascular;  Laterality: N/A;  . Atrial fibrillation ablation N/A 11/08/2013    Procedure: ATRIAL FIBRILLATION ABLATION;  Surgeon: Coralyn Mark, MD;  Location: [place] CATH LAB;  Service: Cardiovascular;  Laterality: N/A;    Current Outpatient Prescriptions  Medication Sig Dispense Refill  . ALPRAZolam (XANAX) 0.5 MG tablet Take 0.5 mg by mouth 2 (two) times daily as needed for anxiety.     Marland Kitchen amoxicillin (AMOXIL) 500 MG capsule  Take four (4) capsules by mouth one (1) hour prior to dental procedures.    . COSOPT PF 22.3-6.8 MG/ML SOLN Place 1 drop into both eyes daily.    . cycloSPORINE (RESTASIS) 0.05 % ophthalmic emulsion Place 1 drop into both eyes 2 (two) times daily.     Marland Kitchen dofetilide (TIKOSYN) 250 MCG capsule Take 1 capsule (250 mcg total) by mouth 2 (two) times daily. (Take along with a 164mcg) 60 capsule 6  . dorzolamide-timolol (COSOPT) 22.3-6.8 MG/ML ophthalmic solution Place 1 drop into both eyes 2 (two) times daily.    . furosemide (LASIX) 80 MG tablet Take 80 mg by mouth at bedtime.    Marland Kitchen HYDROcodone-acetaminophen (NORCO/VICODIN) 5-325 MG per tablet Take 1 tablet by mouth every 6 (six) hours as needed for  moderate pain.    Marland Kitchen latanoprost (XALATAN) 0.005 % ophthalmic solution Place 1 drop into both eyes at bedtime.     Marland Kitchen levothyroxine (SYNTHROID, LEVOTHROID) 88 MCG tablet Take 88 mcg by mouth daily.    . metoprolol succinate (TOPROL-XL) 25 MG 24 hr tablet TAKE 2 TABLETS DAILY. 180 tablet 2  . Multiple Vitamins-Minerals (ICAPS) CAPS Take 1 capsule by mouth 2 (two) times daily.     . Omega-3 Fatty Acids (FISH OIL) 1000 MG CAPS Take 1,000 mg by mouth 4 (four) times daily - after meals and at bedtime.    Marland Kitchen PARoxetine (PAXIL) 20 MG tablet Take 1 tablet (20 mg total) by mouth daily. 90 tablet 3  . potassium chloride SA (K-DUR,KLOR-CON) 20 MEQ tablet Take 1 tablet (20 mEq total) by mouth 2 (two) times daily. KEEP OV. 60 tablet 0  . rosuvastatin (CRESTOR) 40 MG tablet Take 40 mg by mouth daily.    Marland Kitchen TIKOSYN 125 MCG capsule TAKE (1) CAPSULE TWICE DAILY.(ALONG WITH A 250MCG CAPSULE) 60 capsule 10  . traMADol (ULTRAM) 50 MG tablet Take 50 mg by mouth at bedtime as needed for moderate pain. For pain    . warfarin (COUMADIN) 5 MG tablet Take 1 tablet (5 mg total) by mouth as directed. 40 tablet 3  . ZIOPTAN 0.0015 % SOLN Place 1 drop into both eyes at bedtime.     Marland Kitchen zolmitriptan (ZOMIG) 5 MG tablet Take 5 mg by mouth daily as needed for migraine.      No current facility-administered medications for this visit.    Allergies  Allergen Reactions  . Citalopram Hydrobromide Other (See Comments)    Headache and insomnia  . Trazodone And Nefazodone Other (See Comments)    Weakness.    Social History   Social History  . Marital Status: Married    Spouse Name: N/A  . Number of Children: N/A  . Years of Education: N/A   Occupational History  . Not on file.   Social History Main Topics  . Smoking status: Former Smoker -- 1.00 packs/day for 24 years    Types: Cigarettes    Quit date: 07/15/1987  . Smokeless tobacco: Never Used  . Alcohol Use: 8.4 oz/week    14 Glasses of wine per week     Comment:  "probably 2, 4oz glasses of wine/night"  . Drug Use: No  . Sexual Activity: Not Currently   Other Topics Concern  . Not on file   Social History Narrative   Lives in [place] with spouse.  No children.  Retired Education officer, museum.    Family History  Problem Relation Age of Onset  . Stroke Father   . Heart attack Father  25  . Heart disease Father   . Hyperlipidemia Father   . Hypertension Father   . Macular degeneration Mother   . Transient ischemic attack Mother     multiple  . Varicose Veins Mother   . Hypertension Brother     ROS-  All systems are reviewed and are negative except as outlined in the HPI above  Physical Exam: Filed Vitals:   11/05/15 1245  BP: 102/70  Pulse: 87  Height: 5\' 4"  (1.626 m)  Weight: 146 lb 9.6 oz (66.497 kg)    GEN- The patient is well appearing, alert and oriented x 3 today.   Head- normocephalic, atraumatic Eyes-  Sclera clear, conjunctiva pink Ears- hearing intact Oropharynx- clear Neck- supple, no JVP Lymph- no cervical lymphadenopathy Lungs- Clear to ausculation bilaterally, normal work of breathing Heart- regular rate and rhythm, no murmurs, rubs or gallops, PMI not laterally displaced GI- soft, NT, ND, + BS Extremities- no clubbing, cyanosis, or edema MS- no significant deformity or atrophy Skin- no rash or lesion, + palpable purpura both feet and lower legs Psych- euthymic mood, full affect Neuro- strength and sensation are   ekg last week reveals atrial pacing 87 bpm, PR 280 msec, incomplete RBBB, Qtc 493  Assessment and Plan:  1. Pesistent afib/ atypical atrial flutter Appears to be doing well s/p ablation / Tikosyn (AF burden 1.9%) QT is stable She follows in AF clinic with Roderic Palau NP for long term management (every 3 months)  2. Tachycardia/ bradycardia syndrome Normal pacemaker function See Pace Art report No changes today  3. Valvular heart disease Stable No changes today   F/u afib clinic in 3 months.   I will see in 1 year carelink Follow-up with Dr Tamala Julian as scheduled   Thompson Grayer, MD 11/05/2015 5:52 PM

## 2015-11-16 ENCOUNTER — Ambulatory Visit (INDEPENDENT_AMBULATORY_CARE_PROVIDER_SITE_OTHER): Payer: Medicare Other | Admitting: *Deleted

## 2015-11-16 DIAGNOSIS — I481 Persistent atrial fibrillation: Secondary | ICD-10-CM | POA: Diagnosis not present

## 2015-11-16 DIAGNOSIS — I4819 Other persistent atrial fibrillation: Secondary | ICD-10-CM

## 2015-11-16 DIAGNOSIS — I480 Paroxysmal atrial fibrillation: Secondary | ICD-10-CM | POA: Diagnosis not present

## 2015-11-16 LAB — POCT INR: INR: 2.8

## 2015-12-10 NOTE — Progress Notes (Signed)
Cardiology Office Note    Date:  12/11/2015   ID:  Jo Hughes, DOB 11/09/1940, MRN [id number]  PCP:  Irven Shelling, MD  Cardiologist:  Fransico Him, MD   Chief Complaint  Patient presents with  . Sleep Apnea    History of Present Illness:  Jo Hughes is a 75 y.o. female with a history of MVP with endocarditis s/p repair and atrial fibrillation who was referred for sleep study due to atrial fibrillation and excessive daytime sleepiness.  She goes to bed at 12MN - 2am and has trouble falling asleep that she thinks is due to laying in bed worrying bout things.  She goes to bed when she is not sleepy and then cannot get to sleep.  She will sleep as late at 10am.  She naps during the day and then cannot sleep during the night.  She will fall asleep reading and watching TV.  Marland Kitchen  She was found to have mild OSA with an AHI of 7.2/hr with minimal O2 sat of 87% during respiratory events.  She is now here to discuss treatment options.     Past Medical History  Diagnosis Date  . H/O mitral valve repair [facility] , [place].  . H/O tricuspid valve repair   . Hx of acquired endocarditis 1970; 1986    Prior to mitral valve repair   . Hyperlipidemia     cardiac cath clean coronary arties in the past.  . Glaucoma   . Macular degeneration     early , [facility]  . Hypothyroid   . Atrial fibrillation ([place])     persistent  . Symptomatic bradycardia     s/p PPM  . Chronic diastolic CHF (congestive heart failure) ([place])   . Anxiety   . Thyroid nodule   . Long term (current) use of anticoagulants     No bleeding  . Kidney stone 10/2013    "pass it"  . 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 CAD (coronary artery disease)   . OSA (obstructive sleep apnea) 08/14/2015    Mild with AHI 7/hr  . Insomnia 12/11/2015    Past Surgical History  Procedure Laterality Date  . Mitral valve repair  05/2004    [facility]  . Cardioversion  ~  2011; 09/18/2011    ?; Procedure: CARDIOVERSION;  Surgeon: Sinclair Grooms, MD;  Location: [place];  Service: Cardiovascular;  Laterality: N/A;  . Abdominal hysterectomy  1986  . Knee cartilage surgery Left 02/2009    scope; Dr. Berenice Primas  . Lumbar laminectomy  1997  . Tricuspid valve surgery  05/2004    "repair; [facility]"  . Back surgery    . Cataract extraction w/ intraocular lens  implant, bilateral  ~ 2011  . Cardiac catheterization  X 3  . Cardioversion N/A 04/29/2013    Procedure: CARDIOVERSION;  Surgeon: Sinclair Grooms, MD;  Location: [facility] ENDOSCOPY;  Service: Cardiovascular;  Laterality: N/A;  . Tee without cardioversion N/A 11/07/2013    Procedure: TRANSESOPHAGEAL ECHOCARDIOGRAM (TEE);  Surgeon: Pixie Casino, MD;  Location: [facility] ENDOSCOPY;  Service: Cardiovascular;  Laterality: N/A;  . Atrial fibrillation ablation  11/08/13    PVI by Dr Rayann Heman   . Insert / replace / remove pacemaker  01/13/2012    MDT Adapta L implanted by Dr Rayann Heman  . Dilation and curettage of uterus    . Tonsillectomy  1948  .  Breast biopsy Left X 2    "benign"  . Permanent pacemaker insertion N/A 01/13/2012    Procedure: PERMANENT PACEMAKER INSERTION;  Surgeon: Thompson Grayer, MD;  Location: [facility] CATH LAB;  Service: Cardiovascular;  Laterality: N/A;  . Atrial fibrillation ablation N/A 11/08/2013    Procedure: ATRIAL FIBRILLATION ABLATION;  Surgeon: Coralyn Mark, MD;  Location: [place] CATH LAB;  Service: Cardiovascular;  Laterality: N/A;    Current Medications: Outpatient Prescriptions Prior to Visit  Medication Sig Dispense Refill  . ALPRAZolam (XANAX) 0.5 MG tablet Take 0.5 mg by mouth 2 (two) times daily as needed for anxiety.     Marland Kitchen amoxicillin (AMOXIL) 500 MG capsule Take four (4) capsules by mouth one (1) hour prior to dental procedures.    . cycloSPORINE (RESTASIS) 0.05 % ophthalmic emulsion Place 1 drop into both eyes 2 (two) times daily.     Marland Kitchen dofetilide (TIKOSYN) 250 MCG capsule Take 1 capsule (250 mcg  total) by mouth 2 (two) times daily. (Take along with a 175mcg) 60 capsule 6  . dorzolamide-timolol (COSOPT) 22.3-6.8 MG/ML ophthalmic solution Place 1 drop into both eyes 2 (two) times daily.    . furosemide (LASIX) 80 MG tablet Take 80 mg by mouth at bedtime.    Marland Kitchen HYDROcodone-acetaminophen (NORCO/VICODIN) 5-325 MG per tablet Take 1 tablet by mouth every 6 (six) hours as needed for moderate pain.    Marland Kitchen latanoprost (XALATAN) 0.005 % ophthalmic solution Place 1 drop into both eyes at bedtime.     Marland Kitchen levothyroxine (SYNTHROID, LEVOTHROID) 88 MCG tablet Take 88 mcg by mouth daily.    . metoprolol succinate (TOPROL-XL) 25 MG 24 hr tablet TAKE 2 TABLETS DAILY. 180 tablet 2  . Multiple Vitamins-Minerals (ICAPS) CAPS Take 1 capsule by mouth 2 (two) times daily.     . Omega-3 Fatty Acids (FISH OIL) 1000 MG CAPS Take 1,000 mg by mouth 4 (four) times daily - after meals and at bedtime.    Marland Kitchen PARoxetine (PAXIL) 20 MG tablet Take 1 tablet (20 mg total) by mouth daily. 90 tablet 3  . potassium chloride SA (K-DUR,KLOR-CON) 20 MEQ tablet Take 1 tablet (20 mEq total) by mouth 2 (two) times daily. KEEP OV. 60 tablet 0  . rosuvastatin (CRESTOR) 40 MG tablet Take 40 mg by mouth daily.    Marland Kitchen TIKOSYN 125 MCG capsule TAKE (1) CAPSULE TWICE DAILY.(ALONG WITH A 250MCG CAPSULE) 60 capsule 10  . traMADol (ULTRAM) 50 MG tablet Take 50 mg by mouth at bedtime as needed for moderate pain. For pain    . warfarin (COUMADIN) 5 MG tablet Take 1 tablet (5 mg total) by mouth as directed. 40 tablet 3  . ZIOPTAN 0.0015 % SOLN Place 1 drop into both eyes at bedtime.     Marland Kitchen zolmitriptan (ZOMIG) 5 MG tablet Take 5 mg by mouth daily as needed for migraine.     . COSOPT PF 22.3-6.8 MG/ML SOLN Place 1 drop into both eyes daily. Reported on 12/11/2015     No facility-administered medications prior to visit.     Allergies:   Citalopram hydrobromide and Trazodone and nefazodone   Social History   Social History  . Marital Status: Married     Spouse Name: N/A  . Number of Children: N/A  . Years of Education: N/A   Social History Main Topics  . Smoking status: Former Smoker -- 1.00 packs/day for 24 years    Types: Cigarettes    Quit date: 07/15/1987  . Smokeless tobacco: Never  Used  . Alcohol Use: 8.4 oz/week    14 Glasses of wine per week     Comment: "probably 2, 4oz glasses of wine/night"  . Drug Use: No  . Sexual Activity: Not Currently   Other Topics Concern  . None   Social History Narrative   Lives in [place] with spouse.  No children.  Retired Education officer, museum.     Family History:  The patient's family history includes Heart attack (age of onset: 29) in her father; Heart disease in her father; Hyperlipidemia in her father; Hypertension in her brother and father; Macular degeneration in her mother; Stroke in her father; Transient ischemic attack in her mother; Varicose Veins in her mother.   ROS:   Please see the history of present illness.    ROS All other systems reviewed and are negative.   PHYSICAL EXAM:   VS:  BP 107/68 mmHg  Pulse 69  Ht 5\' 4"  (1.626 m)  Wt 145 lb 3.2 oz (65.862 kg)  BMI 24.91 kg/m2   GEN: Well nourished, well developed, in no acute distress HEENT: normal Neck: no JVD, carotid bruits, or masses Cardiac: RRR; no murmurs, rubs, or gallops,no edema.  Intact distal pulses bilaterally.  Respiratory:  clear to auscultation bilaterally, normal work of breathing GI: soft, nontender, nondistended, + BS MS: no deformity or atrophy Skin: warm and dry, no rash Neuro:  Alert and Oriented x 3, Strength and sensation are intact Psych: euthymic mood, full affect  Wt Readings from Last 3 Encounters:  12/11/15 145 lb 3.2 oz (65.862 kg)  11/05/15 146 lb 9.6 oz (66.497 kg)  08/06/15 152 lb 12.8 oz (69.31 kg)      Studies/Labs Reviewed:   EKG:  EKG is not ordered today.    Recent Labs: 05/03/2015: TSH 1.266 08/06/2015: BUN 14; Creatinine, Ser 0.80; Magnesium 2.2; Potassium 4.8; Sodium 142     Lipid Panel No results found for: CHOL, TRIG, HDL, CHOLHDL, VLDL, LDLCALC, LDLDIRECT  Additional studies/ records that were reviewed today include:  Sleep study    ASSESSMENT:    1. OSA (obstructive sleep apnea)   2. Snoring   3. Insomnia      PLAN:  In order of problems listed above:  1. OSA that is very mild with AHI 7/hr and no signficant oxygen desaturations. I discussed considering an oral device.  She does not think that she would tolerate any type of PAP therapy.  She requests a referral to Dr. Ruffin Pyo. 2. Snoring - she says that this is very mild.  I have recommended that she avoid sleeping supine.  3. Insomnia - she has very poor sleep hygiene and goes to bed when she is not sleepy and then cannot get to sleep and will sleep late in the am and nap during the day.  I have counseled her on good sleep hygiene and instructed her to start setting her alarm earlier in the am to get up, avoid daytime naps and slowly reset her circadian rhythm back to normal sleep hours.     Medication Adjustments/Labs and Tests Ordered: Current medicines are reviewed at length with the patient today.  Concerns regarding medicines are outlined above.  Medication changes, Labs and Tests ordered today are listed in the Patient Instructions below.  Patient Instructions  Medication Instructions:  Your physician recommends that you continue on your current medications as directed. Please refer to the Current Medication list given to you today.   Labwork: None  Testing/Procedures: None  Follow-Up: You have been referred to Dr. Lanny Hurst for evaluation of oral device.  Your physician wants you to follow-up in: 6 months with Dr. Radford Pax. You will receive a reminder letter in the mail two months in advance. If you don't receive a letter, please call our office to schedule the follow-up appointment.   Any Other Special Instructions Will Be Listed Below (If Applicable).     If you need a  refill on your cardiac medications before your next appointment, please call your pharmacy.       Signed, Fransico Him, MD  12/11/2015 1:00 PM    [address], [place], [place]  [address] Phone: [phone]; Fax: [phone]

## 2015-12-11 ENCOUNTER — Ambulatory Visit (INDEPENDENT_AMBULATORY_CARE_PROVIDER_SITE_OTHER): Payer: Medicare Other | Admitting: *Deleted

## 2015-12-11 ENCOUNTER — Encounter: Payer: Self-pay | Admitting: Cardiology

## 2015-12-11 ENCOUNTER — Ambulatory Visit (INDEPENDENT_AMBULATORY_CARE_PROVIDER_SITE_OTHER): Payer: Medicare Other | Admitting: Cardiology

## 2015-12-11 VITALS — BP 107/68 | HR 69 | Ht 64.0 in | Wt 145.2 lb

## 2015-12-11 DIAGNOSIS — G4733 Obstructive sleep apnea (adult) (pediatric): Secondary | ICD-10-CM | POA: Diagnosis not present

## 2015-12-11 DIAGNOSIS — I480 Paroxysmal atrial fibrillation: Secondary | ICD-10-CM

## 2015-12-11 DIAGNOSIS — I481 Persistent atrial fibrillation: Secondary | ICD-10-CM

## 2015-12-11 DIAGNOSIS — R0683 Snoring: Secondary | ICD-10-CM | POA: Diagnosis not present

## 2015-12-11 DIAGNOSIS — G4700 Insomnia, unspecified: Secondary | ICD-10-CM | POA: Diagnosis not present

## 2015-12-11 DIAGNOSIS — I4819 Other persistent atrial fibrillation: Secondary | ICD-10-CM

## 2015-12-11 HISTORY — DX: Insomnia, unspecified: G47.00

## 2015-12-11 LAB — POCT INR: INR: 2.1

## 2015-12-11 NOTE — Patient Instructions (Signed)
Medication Instructions:  Your physician recommends that you continue on your current medications as directed. Please refer to the Current Medication list given to you today.   Labwork: None  Testing/Procedures: None  Follow-Up: You have been referred to Dr. Lanny Hurst for evaluation of oral device.  Your physician wants you to follow-up in: 6 months with Dr. Radford Pax. You will receive a reminder letter in the mail two months in advance. If you don't receive a letter, please call our office to schedule the follow-up appointment.   Any Other Special Instructions Will Be Listed Below (If Applicable).     If you need a refill on your cardiac medications before your next appointment, please call your pharmacy.

## 2016-01-12 ENCOUNTER — Other Ambulatory Visit: Payer: Self-pay | Admitting: Interventional Cardiology

## 2016-01-18 ENCOUNTER — Ambulatory Visit (INDEPENDENT_AMBULATORY_CARE_PROVIDER_SITE_OTHER): Payer: Medicare Other | Admitting: Pharmacist

## 2016-01-18 DIAGNOSIS — I480 Paroxysmal atrial fibrillation: Secondary | ICD-10-CM

## 2016-01-18 DIAGNOSIS — I481 Persistent atrial fibrillation: Secondary | ICD-10-CM

## 2016-01-18 DIAGNOSIS — I4819 Other persistent atrial fibrillation: Secondary | ICD-10-CM

## 2016-01-18 LAB — POCT INR: INR: 2.8

## 2016-02-04 ENCOUNTER — Ambulatory Visit (INDEPENDENT_AMBULATORY_CARE_PROVIDER_SITE_OTHER): Payer: Medicare Other | Admitting: *Deleted

## 2016-02-04 ENCOUNTER — Telehealth: Payer: Self-pay | Admitting: Cardiology

## 2016-02-04 DIAGNOSIS — I495 Sick sinus syndrome: Secondary | ICD-10-CM

## 2016-02-04 NOTE — Telephone Encounter (Signed)
LMOVM reminding pt to send remote transmission.   

## 2016-02-04 NOTE — Progress Notes (Signed)
Remote pacemaker transmission.   

## 2016-02-05 LAB — CUP PACEART REMOTE DEVICE CHECK
Battery Voltage: 2.78 V
Brady Statistic AP VP Percent: 0 %
Brady Statistic AP VS Percent: 93 %
Brady Statistic AS VP Percent: 0 %
Brady Statistic AS VS Percent: 7 %
Date Time Interrogation Session: 20170724190115
Implantable Lead Implant Date: 20130702
Implantable Lead Location: 753859
Implantable Lead Model: 5092
Lead Channel Impedance Value: 550 Ohm
Lead Channel Impedance Value: 693 Ohm
Lead Channel Pacing Threshold Amplitude: 0.625 V
Lead Channel Pacing Threshold Pulse Width: 0.4 ms
Lead Channel Setting Pacing Amplitude: 2.5 V
Lead Channel Setting Sensing Sensitivity: 4 mV
MDC IDC LEAD IMPLANT DT: 20130702
MDC IDC LEAD LOCATION: 753860
MDC IDC MSMT BATTERY IMPEDANCE: 306 Ohm
MDC IDC MSMT BATTERY REMAINING LONGEVITY: 107 mo
MDC IDC MSMT LEADCHNL RV SENSING INTR AMPL: 8 mV
MDC IDC SET LEADCHNL RA PACING AMPLITUDE: 2 V
MDC IDC SET LEADCHNL RV PACING PULSEWIDTH: 0.76 ms

## 2016-02-06 ENCOUNTER — Encounter: Payer: Self-pay | Admitting: Cardiology

## 2016-02-24 ENCOUNTER — Other Ambulatory Visit: Payer: Self-pay | Admitting: Internal Medicine

## 2016-02-24 ENCOUNTER — Other Ambulatory Visit: Payer: Self-pay | Admitting: Interventional Cardiology

## 2016-02-26 ENCOUNTER — Other Ambulatory Visit: Payer: Self-pay | Admitting: *Deleted

## 2016-02-26 MED ORDER — DOFETILIDE 250 MCG PO CAPS: 250.0000 ug | ORAL_CAPSULE | Freq: Two times a day (BID) | ORAL | 6 refills | Status: DC

## 2016-02-29 ENCOUNTER — Ambulatory Visit (INDEPENDENT_AMBULATORY_CARE_PROVIDER_SITE_OTHER): Payer: Medicare Other | Admitting: Pharmacist

## 2016-02-29 DIAGNOSIS — I480 Paroxysmal atrial fibrillation: Secondary | ICD-10-CM | POA: Diagnosis not present

## 2016-02-29 LAB — POCT INR: INR: 2.9

## 2016-03-11 ENCOUNTER — Other Ambulatory Visit: Payer: Self-pay | Admitting: Interventional Cardiology

## 2016-03-12 ENCOUNTER — Other Ambulatory Visit: Payer: Self-pay | Admitting: Interventional Cardiology

## 2016-04-11 ENCOUNTER — Ambulatory Visit (INDEPENDENT_AMBULATORY_CARE_PROVIDER_SITE_OTHER): Payer: Medicare Other | Admitting: *Deleted

## 2016-04-11 DIAGNOSIS — I480 Paroxysmal atrial fibrillation: Secondary | ICD-10-CM

## 2016-04-11 LAB — POCT INR: INR: 3.9

## 2016-04-25 ENCOUNTER — Ambulatory Visit (INDEPENDENT_AMBULATORY_CARE_PROVIDER_SITE_OTHER): Payer: Medicare Other

## 2016-04-25 DIAGNOSIS — I480 Paroxysmal atrial fibrillation: Secondary | ICD-10-CM

## 2016-04-25 LAB — POCT INR: INR: 4.4

## 2016-05-05 ENCOUNTER — Telehealth: Payer: Self-pay | Admitting: Cardiology

## 2016-05-05 ENCOUNTER — Ambulatory Visit (INDEPENDENT_AMBULATORY_CARE_PROVIDER_SITE_OTHER): Payer: Medicare Other | Admitting: *Deleted

## 2016-05-05 DIAGNOSIS — I495 Sick sinus syndrome: Secondary | ICD-10-CM

## 2016-05-05 NOTE — Telephone Encounter (Signed)
LMOVM reminding pt to send remote transmission.   

## 2016-05-06 ENCOUNTER — Encounter: Payer: Self-pay | Admitting: Cardiology

## 2016-05-06 NOTE — Progress Notes (Signed)
Remote pacemaker transmission.   

## 2016-05-12 ENCOUNTER — Ambulatory Visit (INDEPENDENT_AMBULATORY_CARE_PROVIDER_SITE_OTHER): Payer: Medicare Other | Admitting: Pharmacist

## 2016-05-12 DIAGNOSIS — I480 Paroxysmal atrial fibrillation: Secondary | ICD-10-CM | POA: Diagnosis not present

## 2016-05-12 LAB — POCT INR: INR: 2.6

## 2016-05-29 ENCOUNTER — Ambulatory Visit: Payer: Medicare Other | Admitting: Interventional Cardiology

## 2016-05-29 ENCOUNTER — Other Ambulatory Visit: Payer: Self-pay | Admitting: Interventional Cardiology

## 2016-05-29 NOTE — Telephone Encounter (Signed)
Will forward to Dr. Tamala Julian for clarification.

## 2016-05-29 NOTE — Telephone Encounter (Signed)
Please advise on refill request as last office visit with Dr Tamala Julian has that the patient takes one 25 mg tablet bid, but the last refill was sent in for two 25 mg tablets qd. Please advise. Thanks, MI

## 2016-05-29 NOTE — Telephone Encounter (Signed)
The patient can take 2 tablets at once or one tablet twice daily. It doesn't matter since the medication is sustained release. It will probably better if she takes it twice a day rather than all at once.

## 2016-06-03 ENCOUNTER — Ambulatory Visit (INDEPENDENT_AMBULATORY_CARE_PROVIDER_SITE_OTHER): Payer: Medicare Other

## 2016-06-03 ENCOUNTER — Other Ambulatory Visit: Payer: Self-pay | Admitting: Interventional Cardiology

## 2016-06-03 DIAGNOSIS — I480 Paroxysmal atrial fibrillation: Secondary | ICD-10-CM

## 2016-06-03 LAB — CUP PACEART REMOTE DEVICE CHECK
Battery Impedance: 331 Ohm
Battery Voltage: 2.77 V
Brady Statistic AP VS Percent: 91 %
Implantable Lead Implant Date: 20130702
Implantable Lead Location: 753860
Implantable Lead Model: 5076
Implantable Lead Model: 5092
Implantable Pulse Generator Implant Date: 20130702
Lead Channel Pacing Threshold Amplitude: 0.625 V
Lead Channel Setting Pacing Amplitude: 2 V
Lead Channel Setting Pacing Pulse Width: 0.76 ms
MDC IDC LEAD IMPLANT DT: 20130702
MDC IDC LEAD LOCATION: 753859
MDC IDC MSMT BATTERY REMAINING LONGEVITY: 104 mo
MDC IDC MSMT LEADCHNL RA IMPEDANCE VALUE: 541 Ohm
MDC IDC MSMT LEADCHNL RA PACING THRESHOLD PULSEWIDTH: 0.4 ms
MDC IDC MSMT LEADCHNL RV IMPEDANCE VALUE: 669 Ohm
MDC IDC MSMT LEADCHNL RV SENSING INTR AMPL: 8 mV
MDC IDC SESS DTM: 20171023192059
MDC IDC SET LEADCHNL RV PACING AMPLITUDE: 2.5 V
MDC IDC SET LEADCHNL RV SENSING SENSITIVITY: 4 mV
MDC IDC STAT BRADY AP VP PERCENT: 0 %
MDC IDC STAT BRADY AS VP PERCENT: 0 %
MDC IDC STAT BRADY AS VS PERCENT: 9 %

## 2016-06-03 LAB — POCT INR: INR: 3

## 2016-06-16 ENCOUNTER — Other Ambulatory Visit: Payer: Self-pay | Admitting: Interventional Cardiology

## 2016-06-16 ENCOUNTER — Other Ambulatory Visit: Payer: Self-pay | Admitting: Internal Medicine

## 2016-06-17 ENCOUNTER — Telehealth: Payer: Self-pay | Admitting: Internal Medicine

## 2016-06-17 NOTE — Telephone Encounter (Signed)
[facility] requesting a refill on Paroxetine 20 mg tablet. Would you like to refill this medication? Please advise

## 2016-06-17 NOTE — Telephone Encounter (Signed)
Need to obtain from PCP

## 2016-07-04 ENCOUNTER — Ambulatory Visit (INDEPENDENT_AMBULATORY_CARE_PROVIDER_SITE_OTHER): Payer: Medicare Other | Admitting: *Deleted

## 2016-07-04 DIAGNOSIS — I4891 Unspecified atrial fibrillation: Secondary | ICD-10-CM

## 2016-07-04 DIAGNOSIS — I480 Paroxysmal atrial fibrillation: Secondary | ICD-10-CM | POA: Diagnosis not present

## 2016-07-04 LAB — POCT INR: INR: 1.8

## 2016-07-21 ENCOUNTER — Ambulatory Visit: Payer: Medicare Other | Admitting: Interventional Cardiology

## 2016-07-23 ENCOUNTER — Encounter: Payer: Self-pay | Admitting: Interventional Cardiology

## 2016-08-04 ENCOUNTER — Telehealth: Payer: Self-pay | Admitting: Cardiology

## 2016-08-04 ENCOUNTER — Encounter: Payer: Self-pay | Admitting: Interventional Cardiology

## 2016-08-04 ENCOUNTER — Encounter: Payer: Medicare Other | Admitting: *Deleted

## 2016-08-04 NOTE — Telephone Encounter (Signed)
LMOVM reminding pt to send remote transmission.   

## 2016-08-08 ENCOUNTER — Encounter: Payer: Self-pay | Admitting: Cardiology

## 2016-08-20 ENCOUNTER — Ambulatory Visit (INDEPENDENT_AMBULATORY_CARE_PROVIDER_SITE_OTHER): Payer: Medicare Other | Admitting: *Deleted

## 2016-08-20 DIAGNOSIS — I495 Sick sinus syndrome: Secondary | ICD-10-CM

## 2016-08-22 ENCOUNTER — Encounter: Payer: Self-pay | Admitting: Cardiology

## 2016-08-22 LAB — CUP PACEART REMOTE DEVICE CHECK
Battery Impedance: 355 Ohm
Brady Statistic AP VS Percent: 89 %
Brady Statistic AS VS Percent: 10 %
Implantable Lead Implant Date: 20130702
Implantable Lead Implant Date: 20130702
Implantable Lead Model: 5076
Implantable Lead Model: 5092
Lead Channel Impedance Value: 524 Ohm
Lead Channel Pacing Threshold Amplitude: 0.625 V
Lead Channel Pacing Threshold Pulse Width: 0.4 ms
Lead Channel Setting Pacing Amplitude: 2 V
MDC IDC LEAD LOCATION: 753859
MDC IDC LEAD LOCATION: 753860
MDC IDC MSMT BATTERY REMAINING LONGEVITY: 101 mo
MDC IDC MSMT BATTERY VOLTAGE: 2.77 V
MDC IDC MSMT LEADCHNL RV IMPEDANCE VALUE: 688 Ohm
MDC IDC PG IMPLANT DT: 20130702
MDC IDC SESS DTM: 20180207185526
MDC IDC SET LEADCHNL RV PACING AMPLITUDE: 2.5 V
MDC IDC SET LEADCHNL RV PACING PULSEWIDTH: 0.76 ms
MDC IDC SET LEADCHNL RV SENSING SENSITIVITY: 4 mV
MDC IDC STAT BRADY AP VP PERCENT: 0 %
MDC IDC STAT BRADY AS VP PERCENT: 0 %

## 2016-08-22 NOTE — Progress Notes (Signed)
Remote pacemaker transmission.   

## 2016-09-01 ENCOUNTER — Ambulatory Visit (INDEPENDENT_AMBULATORY_CARE_PROVIDER_SITE_OTHER): Payer: Medicare Other | Admitting: *Deleted

## 2016-09-01 ENCOUNTER — Ambulatory Visit (INDEPENDENT_AMBULATORY_CARE_PROVIDER_SITE_OTHER): Payer: Medicare Other | Admitting: Interventional Cardiology

## 2016-09-01 ENCOUNTER — Encounter (INDEPENDENT_AMBULATORY_CARE_PROVIDER_SITE_OTHER): Payer: Self-pay

## 2016-09-01 ENCOUNTER — Encounter: Payer: Self-pay | Admitting: Interventional Cardiology

## 2016-09-01 VITALS — BP 110/72 | HR 80 | Ht 64.0 in | Wt 142.4 lb

## 2016-09-01 DIAGNOSIS — G4733 Obstructive sleep apnea (adult) (pediatric): Secondary | ICD-10-CM | POA: Diagnosis not present

## 2016-09-01 DIAGNOSIS — Z950 Presence of cardiac pacemaker: Secondary | ICD-10-CM

## 2016-09-01 DIAGNOSIS — I480 Paroxysmal atrial fibrillation: Secondary | ICD-10-CM

## 2016-09-01 DIAGNOSIS — E784 Other hyperlipidemia: Secondary | ICD-10-CM | POA: Diagnosis not present

## 2016-09-01 DIAGNOSIS — Z9889 Other specified postprocedural states: Secondary | ICD-10-CM

## 2016-09-01 DIAGNOSIS — I5032 Chronic diastolic (congestive) heart failure: Secondary | ICD-10-CM

## 2016-09-01 DIAGNOSIS — Z79899 Other long term (current) drug therapy: Secondary | ICD-10-CM

## 2016-09-01 DIAGNOSIS — E7849 Other hyperlipidemia: Secondary | ICD-10-CM

## 2016-09-01 LAB — POCT INR: INR: 1.5

## 2016-09-01 LAB — BASIC METABOLIC PANEL
BUN/Creatinine Ratio: 16 (ref 12–28)
BUN: 15 mg/dL (ref 8–27)
CALCIUM: 10.4 mg/dL — AB (ref 8.7–10.3)
CO2: 23 mmol/L (ref 18–29)
Chloride: 97 mmol/L (ref 96–106)
Creatinine, Ser: 0.96 mg/dL (ref 0.57–1.00)
GFR, EST AFRICAN AMERICAN: 67 mL/min/{1.73_m2} (ref >59)
GFR, EST NON AFRICAN AMERICAN: 58 mL/min/{1.73_m2} — AB (ref >59)
Glucose: 115 mg/dL — ABNORMAL HIGH (ref 65–99)
POTASSIUM: 3.8 mmol/L (ref 3.5–5.2)
Sodium: 137 mmol/L (ref 134–144)

## 2016-09-01 LAB — MAGNESIUM: Magnesium: 2.2 mg/dL (ref 1.6–2.3)

## 2016-09-01 NOTE — Patient Instructions (Addendum)
Medication Instructions:  None  Labwork: BMET and Magnesium today.  Testing/Procedures: None  Follow-Up: Your physician wants you to follow-up in: 1 year with Dr. Tamala Julian.  You will receive a reminder letter in the mail two months in advance. If you don't receive a letter, please call our office to schedule the follow-up appointment.   Any Other Special Instructions Will Be Listed Below (If Applicable).     If you need a refill on your cardiac medications before your next appointment, please call your pharmacy.

## 2016-09-01 NOTE — Progress Notes (Signed)
Cardiology Office Note    Date:  09/01/2016   ID:  Jo Hughes, DOB Jun 02, 1941, MRN [id number]  PCP:  Irven Shelling, MD  Cardiologist: Sinclair Grooms, MD   Chief Complaint  Patient presents with  . Cardiac Valve Problem  . Atrial Fibrillation    History of Present Illness:  Jo Hughes is a 76 y.o. female female who presents for remote mitral valve repair, paroxysmal atrial fibrillation, status post ablation, chronic dofetilide therapy, chronic anticoagulation therapy, chronic diastolic heart failure, and tachybradycardia syndrome with pacemaker therapy.   No specific cardiac complaints. Has had difficulty with recurring left lower extremity thrombophlebitis. She denies orthopnea, PND, chest pain, and palpitations. No bleeding on Coumadin. No episodes of syncope.    Past Medical History:  Diagnosis Date  . Anxiety   . Atrial fibrillation ([place])    persistent  . CAD (coronary artery disease)   . Chronic diastolic CHF (congestive heart failure) ([place])   . 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 Glaucoma   . H/O mitral valve repair 2005   [place].  . H/O tricuspid valve repair   . Hx of acquired endocarditis 1970; 1986   Prior to mitral valve repair   . Hyperlipidemia    cardiac cath clean coronary arties in the past.  . Hypothyroid   . Insomnia 12/11/2015  . Kidney stone 10/2013   "pass it"  . Long term (current) use of anticoagulants    No bleeding  . Macular degeneration    early , Hecker  . OSA (obstructive sleep apnea) 08/14/2015   Mild with AHI 7/hr  . Symptomatic bradycardia    s/p PPM  . Thyroid nodule     Past Surgical History:  Procedure Laterality Date  . ABDOMINAL HYSTERECTOMY  1986  . ATRIAL FIBRILLATION ABLATION  11/08/13   PVI by Dr Rayann Heman   . ATRIAL FIBRILLATION ABLATION N/A 11/08/2013   Procedure: ATRIAL FIBRILLATION ABLATION;  Surgeon: Coralyn Mark, MD;  Location: [place] CATH LAB;   Service: Cardiovascular;  Laterality: N/A;  . BACK SURGERY    . BREAST BIOPSY Left X 2   "benign"  . CARDIAC CATHETERIZATION  X 3  . CARDIOVERSION  ~ 2011; 09/18/2011   ?; Procedure: CARDIOVERSION;  Surgeon: Sinclair Grooms, MD;  Location: [place];  Service: Cardiovascular;  Laterality: N/A;  . CARDIOVERSION N/A 04/29/2013   Procedure: CARDIOVERSION;  Surgeon: Sinclair Grooms, MD;  Location: [place];  Service: Cardiovascular;  Laterality: N/A;  . CATARACT EXTRACTION W/ INTRAOCULAR LENS  IMPLANT, BILATERAL  ~ 2011  . DILATION AND CURETTAGE OF UTERUS    . INSERT / REPLACE / REMOVE PACEMAKER  01/13/2012   MDT Adapta L implanted by Dr Rayann Heman  . KNEE CARTILAGE SURGERY Left 02/2009   scope; Dr. Berenice Primas  . LUMBAR LAMINECTOMY  1997  . MITRAL VALVE REPAIR  05/2004   [facility]  . PERMANENT PACEMAKER INSERTION N/A 01/13/2012   Procedure: PERMANENT PACEMAKER INSERTION;  Surgeon: Thompson Grayer, MD;  Location: [facility] CATH LAB;  Service: Cardiovascular;  Laterality: N/A;  . TEE WITHOUT CARDIOVERSION N/A 11/07/2013   Procedure: TRANSESOPHAGEAL ECHOCARDIOGRAM (TEE);  Surgeon: Pixie Casino, MD;  Location: [facility] ENDOSCOPY;  Service: Cardiovascular;  Laterality: N/A;  . TONSILLECTOMY  1948  . TRICUSPID VALVE SURGERY  05/2004   "repair; [facility]"    Current Medications: Outpatient Medications Prior to Visit  Medication Sig Dispense  Refill  . ALPRAZolam (XANAX) 0.5 MG tablet Take 0.5 mg by mouth 2 (two) times daily as needed for anxiety.     Marland Kitchen amoxicillin (AMOXIL) 500 MG capsule Take four (4) capsules by mouth one (1) hour prior to dental procedures.    . cycloSPORINE (RESTASIS) 0.05 % ophthalmic emulsion Place 1 drop into both eyes 2 (two) times daily.     Marland Kitchen dofetilide (TIKOSYN) 250 MCG capsule Take 1 capsule (250 mcg total) by mouth 2 (two) times daily. (Take along with a 122mcg) 60 capsule 6  . dorzolamide-timolol (COSOPT) 22.3-6.8 MG/ML ophthalmic solution Place 1 drop into both eyes 2 (two)  times daily.    . furosemide (LASIX) 80 MG tablet Take 80 mg by mouth at bedtime.    Marland Kitchen HYDROcodone-acetaminophen (NORCO/VICODIN) 5-325 MG per tablet Take 1 tablet by mouth every 6 (six) hours as needed for moderate pain.    Marland Kitchen latanoprost (XALATAN) 0.005 % ophthalmic solution Place 1 drop into both eyes at bedtime.     Marland Kitchen levothyroxine (SYNTHROID, LEVOTHROID) 88 MCG tablet Take 88 mcg by mouth daily.    . metoprolol succinate (TOPROL-XL) 25 MG 24 hr tablet TAKE 2 TABLETS DAILY. 180 tablet 1  . Multiple Vitamins-Minerals (ICAPS) CAPS Take 1 capsule by mouth 2 (two) times daily.     . Omega-3 Fatty Acids (FISH OIL) 1000 MG CAPS Take 1,000 mg by mouth 4 (four) times daily - after meals and at bedtime.    Marland Kitchen PARoxetine (PAXIL) 20 MG tablet Take 1 tablet (20 mg total) by mouth daily. 90 tablet 3  . potassium chloride SA (K-DUR,KLOR-CON) 20 MEQ tablet TAKE 1 TABLET TWICE DAILY. 180 tablet 1  . rosuvastatin (CRESTOR) 40 MG tablet Take 40 mg by mouth daily.    Marland Kitchen TIKOSYN 125 MCG capsule TAKE (1) CAPSULE TWICE DAILY.(ALONG WITH A 250MCG CAPSULE) 60 capsule 10  . traMADol (ULTRAM) 50 MG tablet Take 50 mg by mouth at bedtime as needed for moderate pain. For pain    . warfarin (COUMADIN) 5 MG tablet TAKE AS DIRECTED BY COUMADIN CLINIC. 90 tablet 1  . ZIOPTAN 0.0015 % SOLN Place 1 drop into both eyes at bedtime.     Marland Kitchen zolmitriptan (ZOMIG) 5 MG tablet Take 5 mg by mouth daily as needed for migraine.      No facility-administered medications prior to visit.      Allergies:   Citalopram hydrobromide and Trazodone and nefazodone   Social History   Social History  . Marital status: Married    Spouse name: N/A  . Number of children: N/A  . Years of education: N/A   Social History Main Topics  . Smoking status: Former Smoker    Packs/day: 1.00    Years: 24.00    Types: Cigarettes    Quit date: 07/15/1987  . Smokeless tobacco: Never Used  . Alcohol use 8.4 oz/week    14 Glasses of wine per week      Comment: "probably 2, 4oz glasses of wine/night"  . Drug use: No  . Sexual activity: Not Currently   Other Topics Concern  . None   Social History Narrative   Lives in [place] with spouse.  No children.  Retired Education officer, museum.     Family History:  The patient's family history includes Heart attack (age of onset: 71) in her father; Heart disease in her father; Hyperlipidemia in her father; Hypertension in her brother and father; Macular degeneration in her mother; Stroke in her father; Transient  ischemic attack in her mother; Varicose Veins in her mother.   ROS:   Please see the history of present illness.    Stressed and depressed related to the request by her husband to divorce after 52 years of marriage. She additionally complains of excessive fatigue, difficulty sleeping, sweating, left leg pain, irregular heartbeat, anxiety, difficulty with balance, dizziness, headaches, muscle pain, back pain, depression, and change in appetite.  All other systems reviewed and are negative.   PHYSICAL EXAM:   VS:  BP 110/72 (BP Location: Left Arm)   Pulse 80   Ht 5\' 4"  (1.626 m)   Wt 142 lb 6.4 oz (64.6 kg)   BMI 24.44 kg/m    GEN: Well nourished, well developed, in no acute distress  HEENT: normal  Neck: no JVD, carotid bruits, or masses Cardiac: RRR; no murmurs, rubs, or gallops,no edema  Respiratory:  clear to auscultation bilaterally, normal work of breathing GI: soft, nontender, nondistended, + BS MS: no deformity or atrophy  Skin: warm and dry, no rash Neuro:  Alert and Oriented x 3, Strength and sensation are intact Psych: euthymic mood, full affect  Wt Readings from Last 3 Encounters:  09/01/16 142 lb 6.4 oz (64.6 kg)  12/11/15 145 lb 3.2 oz (65.9 kg)  11/05/15 146 lb 9.6 oz (66.5 kg)      Studies/Labs Reviewed:   EKG:  EKG  Atrial pacing, left axis deviation, incomplete right bundle-branch block, rightward axis.  Recent Labs: No results found for requested labs within  last 8760 hours.   Lipid Panel No results found for: CHOL, TRIG, HDL, CHOLHDL, VLDL, LDLCALC, LDLDIRECT  Additional studies/ records that were reviewed today include:  No new data    ASSESSMENT:    1. Paroxysmal atrial fibrillation (HCC)   2. Chronic diastolic heart failure ([place])   3. OSA (obstructive sleep apnea)   4. Other hyperlipidemia   5. On dofetilide therapy   6. Pacemaker-Medtronic   7. Status post mitral valve repair      PLAN:  In order of problems listed above:  1. On dofetilide for rhythm control. Currently in rhythm. No recent electrolyte evaluation 2. No evidence of volume overload. 3. Not addressed 4. Not addressed  5. Potassium and magnesium levels will be obtained today. 6. Normal function at last down load. 7. Normal function without evidence of regurgitation.  Overall doing well. Electrolytes need to be performed to ensure no  Medication Adjustments/Labs and Tests Ordered: Current medicines are reviewed at length with the patient today.  Concerns regarding medicines are outlined above.  Medication changes, Labs and Tests ordered today are listed in the Patient Instructions below. There are no Patient Instructions on file for this visit.   Signed, Sinclair Grooms, MD  09/01/2016 10:15 AM    [facility] [address], [place], [place]  [address] Phone: [phone]; Fax: [phone]

## 2016-09-11 ENCOUNTER — Other Ambulatory Visit: Payer: Self-pay | Admitting: Internal Medicine

## 2016-09-11 ENCOUNTER — Other Ambulatory Visit: Payer: Self-pay | Admitting: Interventional Cardiology

## 2016-09-12 ENCOUNTER — Ambulatory Visit (INDEPENDENT_AMBULATORY_CARE_PROVIDER_SITE_OTHER): Payer: Medicare Other | Admitting: Pharmacist

## 2016-09-12 DIAGNOSIS — I480 Paroxysmal atrial fibrillation: Secondary | ICD-10-CM | POA: Diagnosis not present

## 2016-09-12 LAB — POCT INR: INR: 2.6

## 2016-09-29 ENCOUNTER — Ambulatory Visit (INDEPENDENT_AMBULATORY_CARE_PROVIDER_SITE_OTHER): Payer: Medicare Other | Admitting: *Deleted

## 2016-09-29 DIAGNOSIS — I480 Paroxysmal atrial fibrillation: Secondary | ICD-10-CM | POA: Diagnosis not present

## 2016-09-29 LAB — POCT INR: INR: 2.1

## 2016-10-17 ENCOUNTER — Telehealth: Payer: Self-pay | Admitting: Internal Medicine

## 2016-10-17 NOTE — Telephone Encounter (Signed)
This is a migraine medication that we are not prescribing for the patient. Please advise her to follow up with her PCP/prescribing MD for clarification on dose.

## 2016-10-17 NOTE — Telephone Encounter (Signed)
New message    Patient calling    Pt c/o medication issue:  1. Name of Medication: zolmitriptan (ZOMIG) 5 MG tablet  2. How are you currently taking this medication (dosage and times per day)? 5 mg   3. Are you having a reaction (difficulty breathing--STAT)? no  4. What is your medication issue? Clarification on direction / dosages of medication

## 2016-10-20 ENCOUNTER — Encounter: Payer: Self-pay | Admitting: Internal Medicine

## 2016-10-28 ENCOUNTER — Ambulatory Visit (INDEPENDENT_AMBULATORY_CARE_PROVIDER_SITE_OTHER): Payer: Medicare Other | Admitting: Pharmacist

## 2016-10-28 DIAGNOSIS — I480 Paroxysmal atrial fibrillation: Secondary | ICD-10-CM | POA: Diagnosis not present

## 2016-10-28 LAB — POCT INR: INR: 2.9

## 2016-11-05 ENCOUNTER — Encounter: Payer: Self-pay | Admitting: Internal Medicine

## 2016-11-05 ENCOUNTER — Ambulatory Visit (INDEPENDENT_AMBULATORY_CARE_PROVIDER_SITE_OTHER): Payer: Medicare Other | Admitting: Internal Medicine

## 2016-11-05 VITALS — BP 122/82 | HR 86 | Ht 64.0 in | Wt 146.0 lb

## 2016-11-05 DIAGNOSIS — Z950 Presence of cardiac pacemaker: Secondary | ICD-10-CM | POA: Diagnosis not present

## 2016-11-05 DIAGNOSIS — I495 Sick sinus syndrome: Secondary | ICD-10-CM

## 2016-11-05 DIAGNOSIS — I480 Paroxysmal atrial fibrillation: Secondary | ICD-10-CM

## 2016-11-05 DIAGNOSIS — R001 Bradycardia, unspecified: Secondary | ICD-10-CM | POA: Diagnosis not present

## 2016-11-05 DIAGNOSIS — Z9889 Other specified postprocedural states: Secondary | ICD-10-CM

## 2016-11-05 LAB — CUP PACEART INCLINIC DEVICE CHECK
Battery Remaining Longevity: 98 mo
Battery Voltage: 2.77 V
Brady Statistic AP VP Percent: 0 %
Brady Statistic AS VP Percent: 0 %
Brady Statistic AS VS Percent: 11 %
Implantable Lead Implant Date: 20130702
Implantable Lead Location: 753859
Implantable Lead Model: 5092
Implantable Pulse Generator Implant Date: 20130702
Lead Channel Impedance Value: 541 Ohm
Lead Channel Impedance Value: 687 Ohm
Lead Channel Pacing Threshold Amplitude: 0.75 V
Lead Channel Pacing Threshold Amplitude: 0.75 V
Lead Channel Pacing Threshold Pulse Width: 0.4 ms
Lead Channel Pacing Threshold Pulse Width: 0.76 ms
Lead Channel Setting Pacing Amplitude: 2 V
Lead Channel Setting Pacing Amplitude: 2.5 V
Lead Channel Setting Pacing Pulse Width: 0.76 ms
MDC IDC LEAD IMPLANT DT: 20130702
MDC IDC LEAD LOCATION: 753860
MDC IDC MSMT BATTERY IMPEDANCE: 380 Ohm
MDC IDC MSMT LEADCHNL RA PACING THRESHOLD AMPLITUDE: 0.625 V
MDC IDC MSMT LEADCHNL RA PACING THRESHOLD PULSEWIDTH: 0.4 ms
MDC IDC MSMT LEADCHNL RV SENSING INTR AMPL: 11.2 mV
MDC IDC SESS DTM: 20180425103333
MDC IDC SET LEADCHNL RV SENSING SENSITIVITY: 5.6 mV
MDC IDC STAT BRADY AP VS PERCENT: 88 %

## 2016-11-05 LAB — BASIC METABOLIC PANEL
BUN/Creatinine Ratio: 17 (ref 12–28)
BUN: 13 mg/dL (ref 8–27)
CALCIUM: 10.7 mg/dL — AB (ref 8.7–10.3)
CO2: 26 mmol/L (ref 18–29)
Chloride: 103 mmol/L (ref 96–106)
Creatinine, Ser: 0.76 mg/dL (ref 0.57–1.00)
GFR calc Af Amer: 89 mL/min/{1.73_m2} (ref >59)
GFR calc non Af Amer: 78 mL/min/{1.73_m2} (ref >59)
GLUCOSE: 106 mg/dL — AB (ref 65–99)
Potassium: 4.4 mmol/L (ref 3.5–5.2)
SODIUM: 144 mmol/L (ref 134–144)

## 2016-11-05 LAB — MAGNESIUM: Magnesium: 2.2 mg/dL (ref 1.6–2.3)

## 2016-11-05 NOTE — Progress Notes (Signed)
PCP:  Irven Shelling, MD Primary Cardiologist:  Dr Tamala Julian  The patient presents today for routine electrophysiology followup.  She is maintaining sinus with tikosyn.  She is going through a divorce and is struggling with this.  She finds that she is fatigued and having some trouble with remembering things.  Today, she denies symptoms of chest pain, shortness of breath, orthopnea, PND, lower extremity edema, dizziness, presyncope, syncope, or neurologic sequela. Positive for back and leg pain which seems to be worse in a lying position. The patient feels that she is  tolerating medications without difficulties and is otherwise without complaint today.   Past Medical History:  Diagnosis Date  . Anxiety   . Atrial fibrillation ([place])    persistent  . CAD (coronary artery disease)   . Chronic diastolic CHF (congestive heart failure) ([place])   . 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 Glaucoma   . H/O mitral valve repair 2005   [place].  . H/O tricuspid valve repair   . Hx of acquired endocarditis 1970; 1986   Prior to mitral valve repair   . Hyperlipidemia    cardiac cath clean coronary arties in the past.  . Hypothyroid   . Insomnia 12/11/2015  . Kidney stone 10/2013   "pass it"  . Long term (current) use of anticoagulants    No bleeding  . Macular degeneration    early , Hecker  . OSA (obstructive sleep apnea) 08/14/2015   Mild with AHI 7/hr  . Symptomatic bradycardia    s/p PPM  . Thyroid nodule    Past Surgical History:  Procedure Laterality Date  . ABDOMINAL HYSTERECTOMY  1986  . ATRIAL FIBRILLATION ABLATION  11/08/13   PVI by Dr Rayann Heman   . ATRIAL FIBRILLATION ABLATION N/A 11/08/2013   Procedure: ATRIAL FIBRILLATION ABLATION;  Surgeon: Coralyn Mark, MD;  Location: [place] CATH LAB;  Service: Cardiovascular;  Laterality: N/A;  . BACK SURGERY    . BREAST BIOPSY Left X 2   "benign"  . CARDIAC CATHETERIZATION  X 3  .  CARDIOVERSION  ~ 2011; 09/18/2011   ?; Procedure: CARDIOVERSION;  Surgeon: Sinclair Grooms, MD;  Location: [place];  Service: Cardiovascular;  Laterality: N/A;  . CARDIOVERSION N/A 04/29/2013   Procedure: CARDIOVERSION;  Surgeon: Sinclair Grooms, MD;  Location: [place];  Service: Cardiovascular;  Laterality: N/A;  . CATARACT EXTRACTION W/ INTRAOCULAR LENS  IMPLANT, BILATERAL  ~ 2011  . DILATION AND CURETTAGE OF UTERUS    . INSERT / REPLACE / REMOVE PACEMAKER  01/13/2012   MDT Adapta L implanted by Dr Rayann Heman  . KNEE CARTILAGE SURGERY Left 02/2009   scope; Dr. Berenice Primas  . LUMBAR LAMINECTOMY  1997  . MITRAL VALVE REPAIR  05/2004   [facility]  . PERMANENT PACEMAKER INSERTION N/A 01/13/2012   Procedure: PERMANENT PACEMAKER INSERTION;  Surgeon: Thompson Grayer, MD;  Location: [facility] CATH LAB;  Service: Cardiovascular;  Laterality: N/A;  . TEE WITHOUT CARDIOVERSION N/A 11/07/2013   Procedure: TRANSESOPHAGEAL ECHOCARDIOGRAM (TEE);  Surgeon: Pixie Casino, MD;  Location: [facility] ENDOSCOPY;  Service: Cardiovascular;  Laterality: N/A;  . TONSILLECTOMY  1948  . TRICUSPID VALVE SURGERY  05/2004   "repair; [facility]"    Current Outpatient Prescriptions  Medication Sig Dispense Refill  . ALPRAZolam (XANAX) 0.5 MG tablet Take 0.5 mg by mouth 2 (two) times daily as needed for anxiety.     Marland Kitchen amoxicillin (  AMOXIL) 500 MG capsule Take four (4) capsules by mouth one (1) hour prior to dental procedures.    . cycloSPORINE (RESTASIS) 0.05 % ophthalmic emulsion Place 1 drop into both eyes 2 (two) times daily.     Marland Kitchen dofetilide (TIKOSYN) 125 MCG capsule Take 1 capsule (125 mcg total) by mouth 2 (two) times daily. 180 capsule 0  . dofetilide (TIKOSYN) 250 MCG capsule Take 1 capsule (250 mcg total) by mouth 2 (two) times daily. 180 capsule 0  . dorzolamide-timolol (COSOPT) 22.3-6.8 MG/ML ophthalmic solution Place 1 drop into both eyes 2 (two) times daily.    . furosemide (LASIX) 80 MG tablet Take 80 mg by mouth at  bedtime.    Marland Kitchen HYDROcodone-acetaminophen (NORCO/VICODIN) 5-325 MG per tablet Take 1 tablet by mouth every 6 (six) hours as needed for moderate pain.    Marland Kitchen latanoprost (XALATAN) 0.005 % ophthalmic solution Place 1 drop into both eyes at bedtime.     Marland Kitchen levothyroxine (SYNTHROID, LEVOTHROID) 88 MCG tablet Take 88 mcg by mouth daily.    . metoprolol succinate (TOPROL-XL) 25 MG 24 hr tablet Take 50 mg by mouth daily.    . Multiple Vitamins-Minerals (ICAPS) CAPS Take 1 capsule by mouth 2 (two) times daily.     . Omega-3 Fatty Acids (FISH OIL) 1000 MG CAPS Take 1,000 mg by mouth 4 (four) times daily - after meals and at bedtime.    Marland Kitchen PARoxetine (PAXIL) 20 MG tablet Take 1 tablet (20 mg total) by mouth daily. 90 tablet 3  . potassium chloride SA (K-DUR,KLOR-CON) 20 MEQ tablet Take 20 mEq by mouth 2 (two) times daily.    . rosuvastatin (CRESTOR) 40 MG tablet Take 40 mg by mouth daily.    . traMADol (ULTRAM) 50 MG tablet Take 50 mg by mouth at bedtime as needed for moderate pain. For pain    . warfarin (COUMADIN) 5 MG tablet TAKE AS DIRECTED BY COUMADIN CLINIC. 90 tablet 0  . ZIOPTAN 0.0015 % SOLN Place 1 drop into both eyes at bedtime.     Marland Kitchen zolmitriptan (ZOMIG) 5 MG tablet Take 5 mg by mouth daily as needed for migraine.      No current facility-administered medications for this visit.     Allergies  Allergen Reactions  . Citalopram Hydrobromide Other (See Comments)    Headache and insomnia  . Trazodone And Nefazodone Other (See Comments)    Weakness.    Social History   Social History  . Marital status: Married    Spouse name: N/A  . Number of children: N/A  . Years of education: N/A   Occupational History  . Not on file.   Social History Main Topics  . Smoking status: Former Smoker    Packs/day: 1.00    Years: 24.00    Types: Cigarettes    Quit date: 07/15/1987  . Smokeless tobacco: Never Used  . Alcohol use 8.4 oz/week    14 Glasses of wine per week     Comment: "probably 2, 4oz  glasses of wine/night"  . Drug use: No  . Sexual activity: Not Currently   Other Topics Concern  . Not on file   Social History Narrative   Lives in [place] with spouse.  No children.  Retired Education officer, museum.    Family History  Problem Relation Age of Onset  . Stroke Father   . Heart attack Father 41  . Heart disease Father   . Hyperlipidemia Father   . Hypertension Father   .  Macular degeneration Mother   . Transient ischemic attack Mother     multiple  . Varicose Veins Mother   . Hypertension Brother     ROS-  All systems are reviewed and are negative except as outlined in the HPI above  Physical Exam: Vitals:   11/05/16 1003  BP: 122/82  Pulse: 86  SpO2: 96%  Weight: 146 lb (66.2 kg)  Height: 5\' 4"  (1.626 m)    GEN- The patient is well appearing, alert and oriented x 3 today.   Head- normocephalic, atraumatic Eyes-  Sclera clear, conjunctiva pink Ears- hearing intact Oropharynx- clear Neck- supple, no JVP Lungs- Clear to ausculation bilaterally, normal work of breathing Heart- regular rate and rhythm, no murmurs, rubs or gallops, PMI not laterally displaced GI- soft, NT, ND, + BS Extremities- no clubbing, cyanosis, or edema MS- no significant deformity or atrophy Skin- no rash or lesion  Psych- euthymic mood, full affect Neuro- strength and sensation are   ekg last week reveals atrial pacing 86 bpm, PR 200 msec, incomplete RBBB, Qtc 460  Assessment and Plan:  1. Pesistent afib/ atypical atrial flutter Appears to be doing well s/p ablation / Tikosyn (AF burden 4%-->1.9%) QT is stable  2. Tachycardia/ bradycardia syndrome Normal pacemaker function See Pace Art report No changes today  3. Valvular heart disease Has not had an echo in several years Will update echo   F/u afib clinic in 3 months.  I will see in 1 year carelink Follow-up with Dr Tamala Julian as scheduled   Thompson Grayer, MD 11/05/2016 10:37 AM

## 2016-11-05 NOTE — Patient Instructions (Signed)
Medication Instructions:  Your physician recommends that you continue on your current medications as directed. Please refer to the Current Medication list given to you today.   Labwork: Your physician recommends that you return for lab work today: BMP/Mag   Testing/Procedures: Your physician has requested that you have an echocardiogram. Echocardiography is a painless test that uses sound waves to create images of your heart. It provides your doctor with information about the size and shape of your heart and how well your heart's chambers and valves are working. This procedure takes approximately one hour. There are no restrictions for this procedure.    Follow-Up:  Remote monitoring is used to monitor your Pacemaker from home. This monitoring reduces the number of office visits required to check your device to one time per year. It allows [place] to keep an eye on the functioning of your device to ensure it is working properly. You are scheduled for a device check from home on 02/04/17. You may send your transmission at any time that day. If you have a wireless device, the transmission will be sent automatically. After your physician reviews your transmission, you will receive a postcard with your next transmission date.     Your physician wants you to follow-up in: 12 months with Dr Vallery Ridge will receive a reminder letter in the mail two months in advance. If you don't receive a letter, please call our office to schedule the follow-up appointment.   Any Other Special Instructions Will Be Listed Below (If Applicable).     If you need a refill on your cardiac medications before your next appointment, please call your pharmacy.

## 2016-11-19 ENCOUNTER — Ambulatory Visit (HOSPITAL_COMMUNITY): Payer: Medicare Other | Attending: Cardiovascular Disease

## 2016-11-19 ENCOUNTER — Other Ambulatory Visit: Payer: Self-pay

## 2016-11-19 DIAGNOSIS — I2510 Atherosclerotic heart disease of native coronary artery without angina pectoris: Secondary | ICD-10-CM | POA: Insufficient documentation

## 2016-11-19 DIAGNOSIS — E785 Hyperlipidemia, unspecified: Secondary | ICD-10-CM | POA: Diagnosis not present

## 2016-11-19 DIAGNOSIS — I071 Rheumatic tricuspid insufficiency: Secondary | ICD-10-CM | POA: Diagnosis not present

## 2016-11-19 DIAGNOSIS — Z87891 Personal history of nicotine dependence: Secondary | ICD-10-CM | POA: Diagnosis not present

## 2016-11-19 DIAGNOSIS — I480 Paroxysmal atrial fibrillation: Secondary | ICD-10-CM | POA: Diagnosis not present

## 2016-12-05 ENCOUNTER — Other Ambulatory Visit: Payer: Self-pay | Admitting: Interventional Cardiology

## 2016-12-11 ENCOUNTER — Other Ambulatory Visit: Payer: Self-pay | Admitting: Interventional Cardiology

## 2016-12-11 ENCOUNTER — Ambulatory Visit (INDEPENDENT_AMBULATORY_CARE_PROVIDER_SITE_OTHER): Payer: Medicare Other

## 2016-12-11 DIAGNOSIS — I480 Paroxysmal atrial fibrillation: Secondary | ICD-10-CM

## 2016-12-11 LAB — POCT INR: INR: 2.4

## 2016-12-17 ENCOUNTER — Other Ambulatory Visit: Payer: Self-pay | Admitting: Internal Medicine

## 2016-12-17 ENCOUNTER — Other Ambulatory Visit: Payer: Self-pay | Admitting: Interventional Cardiology

## 2016-12-30 ENCOUNTER — Other Ambulatory Visit: Payer: Self-pay | Admitting: Internal Medicine

## 2017-01-27 ENCOUNTER — Ambulatory Visit (HOSPITAL_COMMUNITY)
Admission: RE | Admit: 2017-01-27 | Discharge: 2017-01-27 | Disposition: A | Discharge status: Home / Self Care | Payer: Medicare Other | Source: Ambulatory Visit | Attending: Nurse Practitioner | Admitting: Nurse Practitioner

## 2017-01-27 ENCOUNTER — Encounter (HOSPITAL_COMMUNITY): Payer: Self-pay | Admitting: Nurse Practitioner

## 2017-01-27 ENCOUNTER — Ambulatory Visit (INDEPENDENT_AMBULATORY_CARE_PROVIDER_SITE_OTHER): Payer: Medicare Other

## 2017-01-27 VITALS — BP 108/66 | HR 68 | Ht 64.0 in | Wt 134.8 lb

## 2017-01-27 DIAGNOSIS — Z950 Presence of cardiac pacemaker: Secondary | ICD-10-CM | POA: Insufficient documentation

## 2017-01-27 DIAGNOSIS — Z8249 Family history of ischemic heart disease and other diseases of the circulatory system: Secondary | ICD-10-CM | POA: Diagnosis not present

## 2017-01-27 DIAGNOSIS — Z823 Family history of stroke: Secondary | ICD-10-CM | POA: Diagnosis not present

## 2017-01-27 DIAGNOSIS — Z87891 Personal history of nicotine dependence: Secondary | ICD-10-CM | POA: Insufficient documentation

## 2017-01-27 DIAGNOSIS — Z7901 Long term (current) use of anticoagulants: Secondary | ICD-10-CM | POA: Insufficient documentation

## 2017-01-27 DIAGNOSIS — H409 Unspecified glaucoma: Secondary | ICD-10-CM | POA: Diagnosis not present

## 2017-01-27 DIAGNOSIS — E039 Hypothyroidism, unspecified: Secondary | ICD-10-CM | POA: Diagnosis not present

## 2017-01-27 DIAGNOSIS — E785 Hyperlipidemia, unspecified: Secondary | ICD-10-CM | POA: Diagnosis not present

## 2017-01-27 DIAGNOSIS — Z87442 Personal history of urinary calculi: Secondary | ICD-10-CM | POA: Diagnosis not present

## 2017-01-27 DIAGNOSIS — I481 Persistent atrial fibrillation: Secondary | ICD-10-CM | POA: Diagnosis not present

## 2017-01-27 DIAGNOSIS — H3530 Unspecified macular degeneration: Secondary | ICD-10-CM | POA: Insufficient documentation

## 2017-01-27 DIAGNOSIS — F419 Anxiety disorder, unspecified: Secondary | ICD-10-CM | POA: Insufficient documentation

## 2017-01-27 DIAGNOSIS — G4700 Insomnia, unspecified: Secondary | ICD-10-CM | POA: Insufficient documentation

## 2017-01-27 DIAGNOSIS — Z79899 Other long term (current) drug therapy: Secondary | ICD-10-CM | POA: Insufficient documentation

## 2017-01-27 DIAGNOSIS — I480 Paroxysmal atrial fibrillation: Secondary | ICD-10-CM

## 2017-01-27 DIAGNOSIS — Z888 Allergy status to other drugs, medicaments and biological substances status: Secondary | ICD-10-CM | POA: Insufficient documentation

## 2017-01-27 DIAGNOSIS — I5032 Chronic diastolic (congestive) heart failure: Secondary | ICD-10-CM | POA: Insufficient documentation

## 2017-01-27 DIAGNOSIS — G4733 Obstructive sleep apnea (adult) (pediatric): Secondary | ICD-10-CM | POA: Diagnosis not present

## 2017-01-27 DIAGNOSIS — I2510 Atherosclerotic heart disease of native coronary artery without angina pectoris: Secondary | ICD-10-CM | POA: Diagnosis not present

## 2017-01-27 LAB — BASIC METABOLIC PANEL
Anion gap: 8 (ref 5–15)
BUN: 14 mg/dL (ref 6–20)
CALCIUM: 10.6 mg/dL — AB (ref 8.9–10.3)
CO2: 25 mmol/L (ref 22–32)
CREATININE: 0.94 mg/dL (ref 0.44–1.00)
Chloride: 103 mmol/L (ref 101–111)
GFR calc non Af Amer: 58 mL/min — ABNORMAL LOW (ref >60)
Glucose, Bld: 104 mg/dL — ABNORMAL HIGH (ref 65–99)
Potassium: 3.8 mmol/L (ref 3.5–5.1)
Sodium: 136 mmol/L (ref 135–145)

## 2017-01-27 LAB — MAGNESIUM: Magnesium: 2.1 mg/dL (ref 1.7–2.4)

## 2017-01-27 LAB — POCT INR: INR: 2.2

## 2017-01-27 NOTE — Progress Notes (Signed)
Patient ID: Jo Hughes, female   DOB: Apr 06, 1941, 76 y.o.   MRN: [id number]     Primary Care Physician: Lavone Orn, MD Referring Physician: Dr. Selinda Orion is a 76 y.o. female with a h/o persistent afib s/p ablation 4/15, on tikosyn for f/u. She reports that she has not noticed any irregular heartbeat. Feels well. Paceart in April of 2018 reports afib burden of 4%.  Being compliant with tiksoyn and warfarin  Today, she denies symptoms of palpitations, chest pain, shortness of breath, orthopnea, PND, lower extremity edema, dizziness, presyncope, syncope, or neurologic sequela. The patient is tolerating medications without difficulties and is otherwise without complaint today.   Past Medical History:  Diagnosis Date  . Anxiety   . Atrial fibrillation ([place])    persistent  . CAD (coronary artery disease)   . Chronic diastolic CHF (congestive heart failure) ([place])   . 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 Glaucoma   . H/O mitral valve repair 2005   [place].  . H/O tricuspid valve repair   . Hx of acquired endocarditis 1970; 1986   Prior to mitral valve repair   . Hyperlipidemia    cardiac cath clean coronary arties in the past.  . Hypothyroid   . Insomnia 12/11/2015  . Kidney stone 10/2013   "pass it"  . Long term (current) use of anticoagulants    No bleeding  . Macular degeneration    early , [place]  . OSA (obstructive sleep apnea) 08/14/2015   Mild with AHI 7/hr  . Symptomatic bradycardia    s/p PPM  . Thyroid nodule    Past Surgical History:  Procedure Laterality Date  . ABDOMINAL HYSTERECTOMY  1986  . ATRIAL FIBRILLATION ABLATION  11/08/13   PVI by Dr Rayann Heman   . ATRIAL FIBRILLATION ABLATION N/A 11/08/2013   Procedure: ATRIAL FIBRILLATION ABLATION;  Surgeon: Coralyn Mark, MD;  Location: [facility] CATH LAB;  Service: Cardiovascular;  Laterality: N/A;  . BACK SURGERY    . BREAST BIOPSY Left X 2   "benign"  .  CARDIAC CATHETERIZATION  X 3  . CARDIOVERSION  ~ 2011; 09/18/2011   ?; Procedure: CARDIOVERSION;  Surgeon: Sinclair Grooms, MD;  Location: [facility];  Service: Cardiovascular;  Laterality: N/A;  . CARDIOVERSION N/A 04/29/2013   Procedure: CARDIOVERSION;  Surgeon: Sinclair Grooms, MD;  Location: [facility];  Service: Cardiovascular;  Laterality: N/A;  . CATARACT EXTRACTION W/ INTRAOCULAR LENS  IMPLANT, BILATERAL  ~ 2011  . DILATION AND CURETTAGE OF UTERUS    . INSERT / REPLACE / REMOVE PACEMAKER  01/13/2012   MDT Adapta L implanted by Dr Rayann Heman  . KNEE CARTILAGE SURGERY Left 02/2009   scope; Dr. Berenice Primas  . LUMBAR LAMINECTOMY  1997  . MITRAL VALVE REPAIR  05/2004   [facility]  . PERMANENT PACEMAKER INSERTION N/A 01/13/2012   Procedure: PERMANENT PACEMAKER INSERTION;  Surgeon: Thompson Grayer, MD;  Location: [facility] CATH LAB;  Service: Cardiovascular;  Laterality: N/A;  . TEE WITHOUT CARDIOVERSION N/A 11/07/2013   Procedure: TRANSESOPHAGEAL ECHOCARDIOGRAM (TEE);  Surgeon: Pixie Casino, MD;  Location: [facility] ENDOSCOPY;  Service: Cardiovascular;  Laterality: N/A;  . TONSILLECTOMY  1948  . TRICUSPID VALVE SURGERY  05/2004   "repair; [facility]"    Current Outpatient Prescriptions  Medication Sig Dispense Refill  . ALPRAZolam (XANAX) 0.5 MG tablet Take 0.5 mg by mouth 2 (two) times daily  as needed for anxiety.     Marland Kitchen amoxicillin (AMOXIL) 500 MG capsule Take four (4) capsules by mouth one (1) hour prior to dental procedures.    . cycloSPORINE (RESTASIS) 0.05 % ophthalmic emulsion Place 1 drop into both eyes 2 (two) times daily.     . dorzolamide-timolol (COSOPT) 22.3-6.8 MG/ML ophthalmic solution Place 1 drop into both eyes 2 (two) times daily.    . furosemide (LASIX) 80 MG tablet Take 80 mg by mouth daily.     Marland Kitchen HYDROcodone-acetaminophen (NORCO/VICODIN) 5-325 MG per tablet Take 1 tablet by mouth every 6 (six) hours as needed for moderate pain.    Marland Kitchen latanoprost (XALATAN) 0.005 % ophthalmic  solution Place 1 drop into both eyes at bedtime.     Marland Kitchen levothyroxine (SYNTHROID, LEVOTHROID) 88 MCG tablet Take 88 mcg by mouth daily.    . metoprolol succinate (TOPROL-XL) 25 MG 24 hr tablet Take 2 tablets (50 mg total) by mouth daily. 60 tablet 7  . Multiple Vitamins-Minerals (ICAPS) CAPS Take 1 capsule by mouth 2 (two) times daily.     . Omega-3 Fatty Acids (FISH OIL) 1000 MG CAPS Take 1,000 mg by mouth 4 (four) times daily - after meals and at bedtime.    Marland Kitchen PARoxetine (PAXIL) 20 MG tablet Take 1 tablet (20 mg total) by mouth daily. 90 tablet 3  . potassium chloride SA (K-DUR,KLOR-CON) 20 MEQ tablet TAKE 1 TABLET TWICE DAILY. 60 tablet 7  . rosuvastatin (CRESTOR) 40 MG tablet Take 40 mg by mouth daily.    Marland Kitchen TIKOSYN 125 MCG capsule TAKE (1) CAPSULE TWICE DAILY.(ALONG WITH A 250MCG CAPSULE) 60 capsule 9  . TIKOSYN 250 MCG capsule TAKE (1) CAPSULE TWICE DAILY.(TAKE ALONG WITH A 125MCG) 60 capsule 9  . traMADol (ULTRAM) 50 MG tablet Take 50 mg by mouth at bedtime as needed for moderate pain. For pain    . warfarin (COUMADIN) 5 MG tablet TAKE AS DIRECTED BY COUMADIN CLINIC. 90 tablet 1  . ZIOPTAN 0.0015 % SOLN Place 1 drop into both eyes at bedtime.     Marland Kitchen zolmitriptan (ZOMIG) 5 MG tablet Take 5 mg by mouth daily as needed for migraine.      No current facility-administered medications for this encounter.     Allergies  Allergen Reactions  . Citalopram Hydrobromide Other (See Comments)    Headache and insomnia  . Trazodone And Nefazodone Other (See Comments)    Weakness.    Social History   Social History  . Marital status: Married    Spouse name: N/A  . Number of children: N/A  . Years of education: N/A   Occupational History  . Not on file.   Social History Main Topics  . Smoking status: Former Smoker    Packs/day: 1.00    Years: 24.00    Types: Cigarettes    Quit date: 07/15/1987  . Smokeless tobacco: Never Used  . Alcohol use 8.4 oz/week    14 Glasses of wine per week      Comment: "probably 2, 4oz glasses of wine/night"  . Drug use: No  . Sexual activity: Not Currently   Other Topics Concern  . Not on file   Social History Narrative   Lives in [place] with spouse.  No children.  Retired Education officer, museum.    Family History  Problem Relation Age of Onset  . Stroke Father   . Heart attack Father 15  . Heart disease Father   . Hyperlipidemia Father   .  Hypertension Father   . Macular degeneration Mother   . Transient ischemic attack Mother        multiple  . Varicose Veins Mother   . Hypertension Brother     ROS- All systems are reviewed and negative except as per the HPI above  Physical Exam: Vitals:   01/27/17 1126  BP: 108/66  Pulse: 68  Weight: 134 lb 12.8 oz (61.1 kg)  Height: 5\' 4"  (1.626 m)    GEN- The patient is well appearing, alert and oriented x 3 today.   Head- normocephalic, atraumatic Eyes-  Sclera clear, conjunctiva pink Ears- hearing intact Oropharynx- clear Neck- supple, no JVP Lymph- no cervical lymphadenopathy Lungs- Clear to ausculation bilaterally, normal work of breathing Heart- Regular rate and rhythm, no murmurs, rubs or gallops, PMI not laterally displaced GI- soft, NT, ND, + BS Extremities- no clubbing, cyanosis, or edema MS- no significant deformity or atrophy Skin- no rash or lesion Psych- euthymic mood, full affect Neuro- strength and sensation are intact  EKG- atrial paced rhythm at 68 bpm, pr int 260 ms, qrs int 102 ms, qtc 489 ms  Epic records reviewed Paceart reviewed  Assessment and Plan: 1. afib Remaining in SR on tikosyn, low afib burden Continue metoprolol Continue warfain Bmet/mag today  F/u in With Dr. Tamala Julian 2/18 and Dr. Rayann Heman 4/18 afib clinic as needed  Geroge Baseman. Tremaine Fuhriman, [facility] [address] [place], [place] [address] [phone]

## 2017-02-04 ENCOUNTER — Encounter: Payer: Medicare Other | Admitting: *Deleted

## 2017-02-04 ENCOUNTER — Telehealth: Payer: Self-pay | Admitting: Cardiology

## 2017-02-04 NOTE — Telephone Encounter (Signed)
Attempted to confirm remote transmission with pt. No answer and was unable to leave a message.   

## 2017-02-06 ENCOUNTER — Encounter: Payer: Self-pay | Admitting: Cardiology

## 2017-02-11 ENCOUNTER — Inpatient Hospital Stay (HOSPITAL_COMMUNITY): Admission: RE | Admit: 2017-02-11 | Payer: Medicare Other | Source: Ambulatory Visit | Admitting: Nurse Practitioner

## 2017-02-16 ENCOUNTER — Ambulatory Visit (HOSPITAL_COMMUNITY)
Admission: RE | Admit: 2017-02-16 | Discharge: 2017-02-16 | Disposition: A | Discharge status: Home / Self Care | Payer: Medicare Other | Source: Ambulatory Visit | Attending: Nurse Practitioner | Admitting: Nurse Practitioner

## 2017-02-16 DIAGNOSIS — I481 Persistent atrial fibrillation: Secondary | ICD-10-CM | POA: Insufficient documentation

## 2017-02-16 LAB — BASIC METABOLIC PANEL
ANION GAP: 5 (ref 5–15)
BUN: 22 mg/dL — AB (ref 6–20)
CHLORIDE: 107 mmol/L (ref 101–111)
CO2: 27 mmol/L (ref 22–32)
Calcium: 10.6 mg/dL — ABNORMAL HIGH (ref 8.9–10.3)
Creatinine, Ser: 1.04 mg/dL — ABNORMAL HIGH (ref 0.44–1.00)
GFR calc Af Amer: 60 mL/min — ABNORMAL LOW (ref >60)
GFR, EST NON AFRICAN AMERICAN: 52 mL/min — AB (ref >60)
GLUCOSE: 103 mg/dL — AB (ref 65–99)
POTASSIUM: 4 mmol/L (ref 3.5–5.1)
Sodium: 139 mmol/L (ref 135–145)

## 2017-02-18 ENCOUNTER — Ambulatory Visit (INDEPENDENT_AMBULATORY_CARE_PROVIDER_SITE_OTHER): Payer: Medicare Other

## 2017-02-18 DIAGNOSIS — I480 Paroxysmal atrial fibrillation: Secondary | ICD-10-CM

## 2017-02-18 LAB — POCT INR: INR: 1.6

## 2017-03-12 ENCOUNTER — Ambulatory Visit (INDEPENDENT_AMBULATORY_CARE_PROVIDER_SITE_OTHER): Payer: Medicare Other | Admitting: *Deleted

## 2017-03-12 DIAGNOSIS — I480 Paroxysmal atrial fibrillation: Secondary | ICD-10-CM

## 2017-03-12 LAB — POCT INR: INR: 3.4

## 2017-03-26 ENCOUNTER — Other Ambulatory Visit: Payer: Self-pay | Admitting: Internal Medicine

## 2017-03-26 ENCOUNTER — Ambulatory Visit
Admission: RE | Admit: 2017-03-26 | Discharge: 2017-03-26 | Disposition: A | Discharge status: Home / Self Care | Payer: Medicare Other | Source: Ambulatory Visit | Attending: Internal Medicine | Admitting: Internal Medicine

## 2017-03-26 DIAGNOSIS — M542 Cervicalgia: Secondary | ICD-10-CM

## 2017-04-07 ENCOUNTER — Ambulatory Visit (INDEPENDENT_AMBULATORY_CARE_PROVIDER_SITE_OTHER): Payer: Medicare Other | Admitting: *Deleted

## 2017-04-07 DIAGNOSIS — I480 Paroxysmal atrial fibrillation: Secondary | ICD-10-CM

## 2017-04-07 DIAGNOSIS — Z5181 Encounter for therapeutic drug level monitoring: Secondary | ICD-10-CM

## 2017-04-07 LAB — POCT INR: INR: 2.1

## 2017-05-05 ENCOUNTER — Ambulatory Visit (INDEPENDENT_AMBULATORY_CARE_PROVIDER_SITE_OTHER): Payer: Medicare Other | Admitting: Pharmacist

## 2017-05-05 DIAGNOSIS — Z5181 Encounter for therapeutic drug level monitoring: Secondary | ICD-10-CM

## 2017-05-05 DIAGNOSIS — I480 Paroxysmal atrial fibrillation: Secondary | ICD-10-CM | POA: Diagnosis not present

## 2017-05-05 LAB — POCT INR: INR: 2.9

## 2017-06-02 ENCOUNTER — Ambulatory Visit (INDEPENDENT_AMBULATORY_CARE_PROVIDER_SITE_OTHER): Payer: Medicare Other | Admitting: *Deleted

## 2017-06-02 DIAGNOSIS — Z5181 Encounter for therapeutic drug level monitoring: Secondary | ICD-10-CM | POA: Diagnosis not present

## 2017-06-02 DIAGNOSIS — I484 Atypical atrial flutter: Secondary | ICD-10-CM

## 2017-06-02 DIAGNOSIS — Z9889 Other specified postprocedural states: Secondary | ICD-10-CM

## 2017-06-02 DIAGNOSIS — I480 Paroxysmal atrial fibrillation: Secondary | ICD-10-CM

## 2017-06-02 LAB — POCT INR: INR: 2.1

## 2017-06-02 NOTE — Patient Instructions (Addendum)
Continue taking 1/2 tablet on Sunday, Tuesday, and Thursday, and 1 tablet on all other days. Recheck INR in 6 weeks. Coumadin Clinic [phone]

## 2017-07-15 ENCOUNTER — Ambulatory Visit (INDEPENDENT_AMBULATORY_CARE_PROVIDER_SITE_OTHER): Payer: Medicare Other

## 2017-07-15 DIAGNOSIS — Z5181 Encounter for therapeutic drug level monitoring: Secondary | ICD-10-CM | POA: Diagnosis not present

## 2017-07-15 DIAGNOSIS — I480 Paroxysmal atrial fibrillation: Secondary | ICD-10-CM | POA: Diagnosis not present

## 2017-07-15 LAB — POCT INR: INR: 1.9

## 2017-07-15 NOTE — Patient Instructions (Signed)
Take 1.5 tablets today, then resume same dosage 1 tablet daily except 1/2 tablet on Sundays, Tuesdays, and Thursdays.  Recheck INR in 6 weeks. [facility]madin Clinic [phone]

## 2017-07-17 ENCOUNTER — Other Ambulatory Visit: Payer: Self-pay | Admitting: Interventional Cardiology

## 2017-08-13 ENCOUNTER — Other Ambulatory Visit: Payer: Self-pay | Admitting: Interventional Cardiology

## 2017-08-25 ENCOUNTER — Ambulatory Visit (INDEPENDENT_AMBULATORY_CARE_PROVIDER_SITE_OTHER): Payer: Medicare Other | Admitting: *Deleted

## 2017-08-25 DIAGNOSIS — Z5181 Encounter for therapeutic drug level monitoring: Secondary | ICD-10-CM | POA: Diagnosis not present

## 2017-08-25 DIAGNOSIS — I480 Paroxysmal atrial fibrillation: Secondary | ICD-10-CM

## 2017-08-25 LAB — POCT INR: INR: 2.2

## 2017-08-25 NOTE — Patient Instructions (Signed)
Description   Continue taking same dosage of 1 tablet daily except 1/2 tablet on Sundays, Tuesdays, and Thursdays.  Recheck INR in 6 weeks. Coumadin Clinic [phone]

## 2017-09-01 NOTE — Progress Notes (Signed)
Cardiology Office Note    Date:  09/02/2017   ID:  Jo Hughes, DOB 1941/05/04, MRN [id number]  PCP:  Lavone Orn, MD  Cardiologist: Sinclair Grooms, MD   Chief Complaint  Patient presents with  . Cardiac Valve Problem  . Atrial Fibrillation    History of Present Illness:  Jo Hughes is a 77 y.o. female female who presents for remote mitral valve repair, paroxysmal atrial fibrillation, status post ablation, chronic dofetilide therapy, chronic anticoagulation therapy, chronic diastolic heart failure, and tachybradycardia syndrome with pacemaker therapy.   She is doing well from cardiac standpoint.  She specifically denies fluid retention, orthopnea, anginal quality chest pain, racing heart, episodes of atrial fibrillation, transient neurological symptoms, and bleeding.      Past Medical History:  Diagnosis Date  . Anxiety   . Atrial fibrillation ([place])    persistent  . CAD (coronary artery disease)   . Chronic diastolic CHF (congestive heart failure) ([place])   . 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 Glaucoma   . H/O mitral valve repair 2005   [place].  . H/O tricuspid valve repair   . Hx of acquired endocarditis 1970; 1986   Prior to mitral valve repair   . Hyperlipidemia    cardiac cath clean coronary arties in the past.  . Hypothyroid   . Insomnia 12/11/2015  . Kidney stone 10/2013   "pass it"  . Long term (current) use of anticoagulants    No bleeding  . Macular degeneration    early , [place]  . OSA (obstructive sleep apnea) 08/14/2015   Mild with AHI 7/hr  . Symptomatic bradycardia    s/p PPM  . Thyroid nodule     Past Surgical History:  Procedure Laterality Date  . ABDOMINAL HYSTERECTOMY  1986  . ATRIAL FIBRILLATION ABLATION  11/08/13   PVI by Dr Rayann Heman   . ATRIAL FIBRILLATION ABLATION N/A 11/08/2013   Procedure: ATRIAL FIBRILLATION ABLATION;  Surgeon: Coralyn Mark, MD;  Location: [facility] CATH LAB;   Service: Cardiovascular;  Laterality: N/A;  . BACK SURGERY    . BREAST BIOPSY Left X 2   "benign"  . CARDIAC CATHETERIZATION  X 3  . CARDIOVERSION  ~ 2011; 09/18/2011   ?; Procedure: CARDIOVERSION;  Surgeon: Sinclair Grooms, MD;  Location: [facility];  Service: Cardiovascular;  Laterality: N/A;  . CARDIOVERSION N/A 04/29/2013   Procedure: CARDIOVERSION;  Surgeon: Sinclair Grooms, MD;  Location: [facility];  Service: Cardiovascular;  Laterality: N/A;  . CATARACT EXTRACTION W/ INTRAOCULAR LENS  IMPLANT, BILATERAL  ~ 2011  . DILATION AND CURETTAGE OF UTERUS    . INSERT / REPLACE / REMOVE PACEMAKER  01/13/2012   MDT Adapta L implanted by Dr Rayann Heman  . KNEE CARTILAGE SURGERY Left 02/2009   scope; Dr. Berenice Primas  . LUMBAR LAMINECTOMY  1997  . MITRAL VALVE REPAIR  05/2004   [facility]  . PERMANENT PACEMAKER INSERTION N/A 01/13/2012   Procedure: PERMANENT PACEMAKER INSERTION;  Surgeon: Thompson Grayer, MD;  Location: [facility] CATH LAB;  Service: Cardiovascular;  Laterality: N/A;  . TEE WITHOUT CARDIOVERSION N/A 11/07/2013   Procedure: TRANSESOPHAGEAL ECHOCARDIOGRAM (TEE);  Surgeon: Pixie Casino, MD;  Location: [facility] ENDOSCOPY;  Service: Cardiovascular;  Laterality: N/A;  . TONSILLECTOMY  1948  . TRICUSPID VALVE SURGERY  05/2004   "repair; [facility]"    Current Medications: Outpatient Medications Prior to Visit  Medication  Sig Dispense Refill  . ALPRAZolam (XANAX) 0.5 MG tablet Take 0.5 mg by mouth 2 (two) times daily as needed for anxiety.     Marland Kitchen amoxicillin (AMOXIL) 500 MG capsule Take four (4) capsules by mouth one (1) hour prior to dental procedures.    . cycloSPORINE (RESTASIS) 0.05 % ophthalmic emulsion Place 1 drop into both eyes 2 (two) times daily.     . dorzolamide-timolol (COSOPT) 22.3-6.8 MG/ML ophthalmic solution Place 1 drop into both eyes 2 (two) times daily.    . furosemide (LASIX) 80 MG tablet Take 80 mg by mouth daily.     Marland Kitchen HYDROcodone-acetaminophen (NORCO/VICODIN) 5-325 MG per  tablet Take 1 tablet by mouth every 6 (six) hours as needed for moderate pain.    Marland Kitchen latanoprost (XALATAN) 0.005 % ophthalmic solution Place 1 drop into both eyes at bedtime.     Marland Kitchen levothyroxine (SYNTHROID, LEVOTHROID) 88 MCG tablet Take 88 mcg by mouth daily.    . metoprolol succinate (TOPROL-XL) 25 MG 24 hr tablet TAKE 2 TABLETS BY MOUTH ONCE DAILY. 60 tablet 0  . Multiple Vitamins-Minerals (ICAPS) CAPS Take 1 capsule by mouth 2 (two) times daily.     . Omega-3 Fatty Acids (FISH OIL) 1000 MG CAPS Take 1,000 mg by mouth 4 (four) times daily - after meals and at bedtime.    Marland Kitchen PARoxetine (PAXIL) 20 MG tablet Take 1 tablet (20 mg total) by mouth daily. 90 tablet 3  . potassium chloride SA (K-DUR,KLOR-CON) 20 MEQ tablet TAKE 1 TABLET TWICE DAILY. 60 tablet 7  . rosuvastatin (CRESTOR) 40 MG tablet Take 40 mg by mouth daily.    Marland Kitchen TIKOSYN 125 MCG capsule TAKE (1) CAPSULE TWICE DAILY.(ALONG WITH A 250MCG CAPSULE) 60 capsule 9  . TIKOSYN 250 MCG capsule TAKE (1) CAPSULE TWICE DAILY.(TAKE ALONG WITH A 125MCG) 60 capsule 9  . traMADol (ULTRAM) 50 MG tablet Take 50 mg by mouth at bedtime as needed for moderate pain. For pain    . warfarin (COUMADIN) 5 MG tablet TAKE AS DIRECTED BY COUMADIN CLINIC. 90 tablet 1  . ZIOPTAN 0.0015 % SOLN Place 1 drop into both eyes at bedtime.     Marland Kitchen zolmitriptan (ZOMIG) 5 MG tablet Take 5 mg by mouth daily as needed for migraine.      No facility-administered medications prior to visit.      Allergies:   Citalopram hydrobromide and Trazodone and nefazodone   Social History   Socioeconomic History  . Marital status: Married    Spouse name: None  . Number of children: None  . Years of education: None  . Highest education level: None  Social Needs  . Financial resource strain: None  . Food insecurity - worry: None  . Food insecurity - inability: None  . Transportation needs - medical: None  . Transportation needs - non-medical: None  Occupational History  . None    Tobacco Use  . Smoking status: Former Smoker    Packs/day: 1.00    Years: 24.00    Pack years: 24.00    Types: Cigarettes    Last attempt to quit: 07/15/1987    Years since quitting: 30.1  . Smokeless tobacco: Never Used  Substance and Sexual Activity  . Alcohol use: Yes    Alcohol/week: 8.4 oz    Types: 14 Glasses of wine per week    Comment: "probably 2, 4oz glasses of wine/night"  . Drug use: No  . Sexual activity: Not Currently  Other Topics Concern  .  None  Social History Narrative   Lives in [place] with spouse.  No children.  Retired Education officer, museum.     Family History:  The patient's family history includes Heart attack (age of onset: 42) in her father; Heart disease in her father; Hyperlipidemia in her father; Hypertension in her brother and father; Macular degeneration in her mother; Stroke in her father; Transient ischemic attack in her mother; Varicose Veins in her mother.   ROS:   Please see the history of present illness.    Denies blood in urine and stool.  Under a lot of stress related to divorce proceedings.  She is depressed.  She has chronic back pain.  She has rare episodes of bilateral lower extremity swelling if she stands or sits too long.  Appetite has been stable.  Some leg pain.  Some anxiety some joint swelling. All other systems reviewed and are negative.   PHYSICAL EXAM:   VS:  BP 112/72   Pulse 88   Ht 5\' 4"  (1.626 m)   Wt 135 lb 6.4 oz (61.4 kg)   BMI 23.24 kg/m    GEN: Well nourished, well developed, in no acute distress  HEENT: normal  Neck: no JVD, carotid bruits, or masses Cardiac: RRR; no murmurs, rubs, or gallops,no edema  Respiratory:  clear to auscultation bilaterally, normal work of breathing GI: soft, nontender, nondistended, + BS MS: no deformity or atrophy  Skin: warm and dry, no rash Neuro:  Alert and Oriented x 3, Strength and sensation are intact Psych: euthymic mood, full affect  Wt Readings from Last 3 Encounters:   09/02/17 135 lb 6.4 oz (61.4 kg)  01/27/17 134 lb 12.8 oz (61.1 kg)  11/05/16 146 lb (66.2 kg)      Studies/Labs Reviewed:   EKG:  EKG not repeated today.  Recent Labs: 01/27/2017: Magnesium 2.1 02/16/2017: BUN 22; Creatinine, Ser 1.04; Potassium 4.0; Sodium 139   Lipid Panel No results found for: CHOL, TRIG, HDL, CHOLHDL, VLDL, LDLCALC, LDLDIRECT  Additional studies/ records that were reviewed today include:  None    ASSESSMENT:    1. Chronic diastolic heart failure ([place])   2. Atypical atrial flutter (H[facility])   3. Status post mitral valve repair   4. Bradycardia   5. Pacemaker-Medtronic   6. Other hyperlipidemia   7. On dofetilide therapy      PLAN:  In order of problems listed above:  1. Stable without volume overload 2. No clinical symptoms or evidence of presents on today's exam. 3. No murmur or clinical evidence of valve dysfunction. 4. Stable without clinical symptomatic bradycardia.  Has permanent pacemaker. 5. Normal function when last evaluated. 6. LDL target should be less than 100.  Dofetilide therapy without 7. Significant problems.  Last potassium level 4.0 magnesium 2.2.  Values have been stable over time.  Clinical follow-up in 1 year.  Magnesium and potassium were normal in August 2018.  Medication Adjustments/Labs and Tests Ordered: Current medicines are reviewed at length with the patient today.  Concerns regarding medicines are outlined above.  Medication changes, Labs and Tests ordered today are listed in the Patient Instructions below. Patient Instructions  Medication Instructions:  Your physician recommends that you continue on your current medications as directed. Please refer to the Current Medication list given to you today.  Labwork: None  Testing/Procedures: None  Follow-Up: Your physician wants you to follow-up in: 1 year with Dr. Tamala Julian.  You will receive a reminder letter in the mail two months in  advance. If you don't receive a  letter, please call our office to schedule the follow-up appointment.   Any Other Special Instructions Will Be Listed Below (If Applicable).     If you need a refill on your cardiac medications before your next appointment, please call your pharmacy.      Signed, Sinclair Grooms, MD  09/02/2017 3:43 PM    [facility] [place], [place], [place]  [address] Phone: [phone]; Fax: [phone]

## 2017-09-02 ENCOUNTER — Ambulatory Visit (INDEPENDENT_AMBULATORY_CARE_PROVIDER_SITE_OTHER): Payer: Medicare Other | Admitting: Interventional Cardiology

## 2017-09-02 ENCOUNTER — Encounter: Payer: Self-pay | Admitting: Interventional Cardiology

## 2017-09-02 VITALS — BP 112/72 | HR 88 | Ht 64.0 in | Wt 135.4 lb

## 2017-09-02 DIAGNOSIS — R001 Bradycardia, unspecified: Secondary | ICD-10-CM

## 2017-09-02 DIAGNOSIS — I484 Atypical atrial flutter: Secondary | ICD-10-CM

## 2017-09-02 DIAGNOSIS — E7849 Other hyperlipidemia: Secondary | ICD-10-CM | POA: Diagnosis not present

## 2017-09-02 DIAGNOSIS — Z950 Presence of cardiac pacemaker: Secondary | ICD-10-CM

## 2017-09-02 DIAGNOSIS — Z79899 Other long term (current) drug therapy: Secondary | ICD-10-CM | POA: Diagnosis not present

## 2017-09-02 DIAGNOSIS — Z9889 Other specified postprocedural states: Secondary | ICD-10-CM

## 2017-09-02 DIAGNOSIS — I5032 Chronic diastolic (congestive) heart failure: Secondary | ICD-10-CM

## 2017-09-02 NOTE — Patient Instructions (Signed)

## 2017-09-03 ENCOUNTER — Other Ambulatory Visit: Payer: Self-pay | Admitting: Internal Medicine

## 2017-09-11 ENCOUNTER — Other Ambulatory Visit: Payer: Self-pay | Admitting: Interventional Cardiology

## 2017-10-06 ENCOUNTER — Ambulatory Visit (INDEPENDENT_AMBULATORY_CARE_PROVIDER_SITE_OTHER): Payer: Medicare Other | Admitting: *Deleted

## 2017-10-06 ENCOUNTER — Encounter (INDEPENDENT_AMBULATORY_CARE_PROVIDER_SITE_OTHER): Payer: Self-pay

## 2017-10-06 DIAGNOSIS — Z5181 Encounter for therapeutic drug level monitoring: Secondary | ICD-10-CM | POA: Diagnosis not present

## 2017-10-06 DIAGNOSIS — I480 Paroxysmal atrial fibrillation: Secondary | ICD-10-CM | POA: Diagnosis not present

## 2017-10-06 LAB — POCT INR: INR: 2.4

## 2017-10-06 NOTE — Patient Instructions (Signed)
Description   Continue taking same dosage of 1 tablet daily except 1/2 tablet on Sundays, Tuesdays, and Thursdays.  Recheck INR in 6 weeks. Coumadin Clinic [phone]

## 2017-10-13 ENCOUNTER — Other Ambulatory Visit: Payer: Self-pay | Admitting: Internal Medicine

## 2017-10-20 ENCOUNTER — Telehealth: Payer: Self-pay | Admitting: Interventional Cardiology

## 2017-10-20 NOTE — Telephone Encounter (Signed)
Pt went to dentist yesterday and she states dentist was afraid she may be developing bacterial endocarditis again.  Pt had  previous episodes of this when she was 27 and again in her 30's or 40's.  Pt denies any sx and states she feels fine.  Pt had bleeding in her mouth in one area and it is red and swollen.  Pt says dentist said it is not an abscess.  Pt took 2 Amoxicillin tabs prior to her dental cleaning and took 2 additional tablets when she got home d/t concern of endocarditis.  Pt states with her previous episodes of endocarditis she developed a high fever and back ache.  Not currently having any of these issues.  Advised I would send message to Dr. Tamala Julian for review and advisement.

## 2017-10-20 NOTE — Telephone Encounter (Signed)
Spoke with Jo Hughes and let her know what Dr. Tamala Julian said.  Jo Hughes appreciative for call.

## 2017-10-20 NOTE — Telephone Encounter (Signed)
New Message:     Pt states she has had bleeding in her mouth and the dr is concerned it's bacterial endocarditis.

## 2017-10-20 NOTE — Telephone Encounter (Signed)
Gum bleeding is not a symptom of endocarditis.  I have no specific recommendations other than to follow those provided by the dentist.

## 2017-11-09 ENCOUNTER — Ambulatory Visit (INDEPENDENT_AMBULATORY_CARE_PROVIDER_SITE_OTHER): Payer: Medicare Other | Admitting: Internal Medicine

## 2017-11-09 ENCOUNTER — Other Ambulatory Visit: Payer: Self-pay | Admitting: Internal Medicine

## 2017-11-09 ENCOUNTER — Encounter: Payer: Self-pay | Admitting: Internal Medicine

## 2017-11-09 VITALS — BP 120/72 | HR 82 | Ht 64.0 in | Wt 134.0 lb

## 2017-11-09 DIAGNOSIS — Z79899 Other long term (current) drug therapy: Secondary | ICD-10-CM

## 2017-11-09 DIAGNOSIS — R001 Bradycardia, unspecified: Secondary | ICD-10-CM | POA: Diagnosis not present

## 2017-11-09 DIAGNOSIS — I480 Paroxysmal atrial fibrillation: Secondary | ICD-10-CM | POA: Diagnosis not present

## 2017-11-09 NOTE — Patient Instructions (Signed)
Medication Instructions:  Your physician recommends that you continue on your current medications as directed. Please refer to the Current Medication list given to you today.  *If you need a refill on your cardiac medications before your next appointment, please call your pharmacy*  Labwork: Today: Magnesium level  Testing/Procedures: None ordered  Follow-Up: Remote monitoring is used to monitor your Pacemaker or ICD from home. This monitoring reduces the number of office visits required to check your device to one time per year. It allows [place] to keep an eye on the functioning of your device to ensure it is working properly. You are scheduled for a device check from home on 02/08/2018. You may send your transmission at any time that day. If you have a wireless device, the transmission will be sent automatically. After your physician reviews your transmission, you will receive a postcard with your next transmission date.  Your physician wants you to follow-up in: 6 months with Chanetta Marshall, NP.  You will receive a reminder letter in the mail two months in advance. If you don't receive a letter, please call our office to schedule the follow-up appointment.  Thank you for choosing [facility]!!

## 2017-11-09 NOTE — Progress Notes (Signed)
PCP: Lavone Orn, MD Primary Cardiologist:  Dr Tamala Julian Primary EP:  Dr Selinda Orion is a 77 y.o. female who presents today for routine electrophysiology followup.  Since last being seen in our clinic, the patient reports doing very well.  Today, she denies symptoms of palpitations, chest pain, shortness of breath,  lower extremity edema, dizziness, presyncope, or syncope.  The patient is otherwise without complaint today.   Past Medical History:  Diagnosis Date  . Anxiety   . Atrial fibrillation ([place])    persistent  . CAD (coronary artery disease)   . Chronic diastolic CHF (congestive heart failure) ([place])   . 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 Glaucoma   . H/O mitral valve repair 2005   [facility].  . H/O tricuspid valve repair   . Hx of acquired endocarditis 1970; 1986   Prior to mitral valve repair   . Hyperlipidemia    cardiac cath clean coronary arties in the past.  . Hypothyroid   . Insomnia 12/11/2015  . Kidney stone 10/2013   "pass it"  . Long term (current) use of anticoagulants    No bleeding  . Macular degeneration    early , Hecker  . OSA (obstructive sleep apnea) 08/14/2015   Mild with AHI 7/hr  . Symptomatic bradycardia    s/p PPM  . Thyroid nodule    Past Surgical History:  Procedure Laterality Date  . ABDOMINAL HYSTERECTOMY  1986  . ATRIAL FIBRILLATION ABLATION  11/08/13   PVI by Dr Rayann Heman   . ATRIAL FIBRILLATION ABLATION N/A 11/08/2013   Procedure: ATRIAL FIBRILLATION ABLATION;  Surgeon: Coralyn Mark, MD;  Location: [place] CATH LAB;  Service: Cardiovascular;  Laterality: N/A;  . BACK SURGERY    . BREAST BIOPSY Left X 2   "benign"  . CARDIAC CATHETERIZATION  X 3  . CARDIOVERSION  ~ 2011; 09/18/2011   ?; Procedure: CARDIOVERSION;  Surgeon: Sinclair Grooms, MD;  Location: [place];  Service: Cardiovascular;  Laterality: N/A;  . CARDIOVERSION N/A 04/29/2013   Procedure: CARDIOVERSION;  Surgeon: Sinclair Grooms, MD;  Location: [place];  Service: Cardiovascular;  Laterality: N/A;  . CATARACT EXTRACTION W/ INTRAOCULAR LENS  IMPLANT, BILATERAL  ~ 2011  . DILATION AND CURETTAGE OF UTERUS    . INSERT / REPLACE / REMOVE PACEMAKER  01/13/2012   MDT Adapta L implanted by Dr Rayann Heman  . KNEE CARTILAGE SURGERY Left 02/2009   scope; Dr. Berenice Primas  . LUMBAR LAMINECTOMY  1997  . MITRAL VALVE REPAIR  05/2004   [facility]  . PERMANENT PACEMAKER INSERTION N/A 01/13/2012   Procedure: PERMANENT PACEMAKER INSERTION;  Surgeon: [place] Grayer, MD;  [facility];  Service: Cardiovascular;  Laterality: N/A;  . TEE WITHOUT CARDIOVERSION N/A 11/07/2013   Procedure: TRANSESOPHAGEAL ECHOCARDIOGRAM (TEE);  Surgeon: Pixie Casino, MD;  Location: [facility] ENDOSCOPY;  Service: Cardiovascular;  Laterality: N/A;  . TONSILLECTOMY  1948  . TRICUSPID VALVE SURGERY  05/2004   "repair; [facility]"    ROS- all systems are reviewed and negative except as per HPI above  Current Outpatient Medications  Medication Sig Dispense Refill  . ALPRAZolam (XANAX) 0.5 MG tablet Take 0.5 mg by mouth 2 (two) times daily as needed for anxiety.     Marland Kitchen amoxicillin (AMOXIL) 500 MG capsule Take four (4) capsules by mouth one (1) hour prior to dental procedures.    Marland Kitchen  cycloSPORINE (RESTASIS) 0.05 % ophthalmic emulsion Place 1 drop into both eyes 2 (two) times daily.     . dorzolamide-timolol (COSOPT) 22.3-6.8 MG/ML ophthalmic solution Place 1 drop into both eyes 2 (two) times daily.    . furosemide (LASIX) 80 MG tablet Take 80 mg by mouth daily.     Marland Kitchen HYDROcodone-acetaminophen (NORCO/VICODIN) 5-325 MG per tablet Take 1 tablet by mouth every 6 (six) hours as needed for moderate pain.    Marland Kitchen latanoprost (XALATAN) 0.005 % ophthalmic solution Place 1 drop into both eyes at bedtime.     Marland Kitchen levothyroxine (SYNTHROID, LEVOTHROID) 88 MCG tablet Take 88 mcg by mouth daily.    . metoprolol succinate (TOPROL-XL) 25 MG 24 hr tablet TAKE 2 TABLETS  BY MOUTH ONCE DAILY. 60 tablet 10  . Multiple Vitamins-Minerals (ICAPS) CAPS Take 1 capsule by mouth 2 (two) times daily.     . Omega-3 Fatty Acids (FISH OIL) 1000 MG CAPS Take 1,000 mg by mouth 4 (four) times daily - after meals and at bedtime.    Marland Kitchen PARoxetine (PAXIL) 20 MG tablet Take 1 tablet (20 mg total) by mouth daily. 90 tablet 3  . potassium chloride SA (K-DUR,KLOR-CON) 20 MEQ tablet TAKE 1 TABLET BY MOUTH TWICE DAILY. 60 tablet 11  . rosuvastatin (CRESTOR) 40 MG tablet Take 40 mg by mouth daily.    Marland Kitchen TIKOSYN 125 MCG capsule TAKE (1) CAPSULE TWICE DAILY.(ALONG WITH A 250MCG CAPSULE) 60 capsule 0  . TIKOSYN 250 MCG capsule TAKE (1) CAPSULE TWICE DAILY.(TAKE ALONG WITH A 125MCG) 60 capsule 0  . traMADol (ULTRAM) 50 MG tablet Take 50 mg by mouth at bedtime as needed for moderate pain. For pain    . warfarin (COUMADIN) 5 MG tablet TAKE AS DIRECTED BY COUMADIN CLINIC. 90 tablet 1  . ZIOPTAN 0.0015 % SOLN Place 1 drop into both eyes at bedtime.     Marland Kitchen zolmitriptan (ZOMIG) 5 MG tablet Take 5 mg by mouth daily as needed for migraine.      No current facility-administered medications for this visit.     Physical Exam: Vitals:   11/09/17 1405  BP: 120/72  Pulse: 82  Weight: 134 lb (60.8 kg)  Height: 5\' 4"  (1.626 m)    GEN- The patient is well appearing, alert and oriented x 3 today.   Head- normocephalic, atraumatic Eyes-  Sclera clear, conjunctiva pink Ears- hearing intact Oropharynx- clear Lungs- Clear to ausculation bilaterally, normal work of breathing Chest- pacemaker pocket is well healed Heart- Regular rate and rhythm, no murmurs, rubs or gallops, PMI not laterally displaced GI- soft, NT, ND, + BS Extremities- no clubbing, cyanosis, or edema  Pacemaker interrogation- reviewed in detail today,  See PACEART report  ekg tracing ordered today is personally reviewed and shows atrial paced rhythm 82 bpm, PR 332 msec, Qtc 440 msec  Assessment and Plan:  1. Symptomatic sinus  bradycardia  Normal pacemaker function See Pace Art report No changes today  2. Persistent afib/ atypical atrial flutter Doing well with tikosyn Af burden is 4.6 % today (1.9% last visit) Bmet is reviewed from 09/14/17,   Overdue for mg level.  Will check today Severe LA enlargement by echo  3. Valvular heart disease Echo 11/19/16 reviewed with her today On coumadin  [facility] Return to see EP NP in 6 months  [place] Grayer MD, [facility] 11/09/2017 2:11 PM

## 2017-11-10 LAB — CUP PACEART INCLINIC DEVICE CHECK
Date Time Interrogation Session: 20190430074607
Implantable Lead Implant Date: 20130702
Implantable Lead Location: 753859
Implantable Lead Model: 5076
Lead Channel Impedance Value: 532 Ohm
Lead Channel Impedance Value: 695 Ohm
Lead Channel Pacing Threshold Pulse Width: 0.4 ms
Lead Channel Sensing Intrinsic Amplitude: 11 mV
MDC IDC LEAD IMPLANT DT: 20130702
MDC IDC LEAD LOCATION: 753860
MDC IDC MSMT LEADCHNL RA PACING THRESHOLD AMPLITUDE: 0.75 V
MDC IDC MSMT LEADCHNL RV PACING THRESHOLD AMPLITUDE: 0.75 V
MDC IDC MSMT LEADCHNL RV PACING THRESHOLD PULSEWIDTH: 0.4 ms
MDC IDC PG IMPLANT DT: 20130702

## 2017-11-10 LAB — MAGNESIUM: Magnesium: 2.5 mg/dL — ABNORMAL HIGH (ref 1.6–2.3)

## 2017-11-23 ENCOUNTER — Ambulatory Visit (INDEPENDENT_AMBULATORY_CARE_PROVIDER_SITE_OTHER): Payer: Medicare Other | Admitting: *Deleted

## 2017-11-23 DIAGNOSIS — Z5181 Encounter for therapeutic drug level monitoring: Secondary | ICD-10-CM

## 2017-11-23 DIAGNOSIS — I480 Paroxysmal atrial fibrillation: Secondary | ICD-10-CM | POA: Diagnosis not present

## 2017-11-23 LAB — POCT INR: INR: 2.3

## 2017-11-23 NOTE — Patient Instructions (Signed)
Description   Continue taking same dosage of 1 tablet daily except 1/2 tablet on Sundays, Tuesdays, and Thursdays. Recheck INR in 6 weeks. Coumadin Clinic [phone]

## 2018-01-07 ENCOUNTER — Ambulatory Visit (INDEPENDENT_AMBULATORY_CARE_PROVIDER_SITE_OTHER): Payer: Medicare Other | Admitting: Pharmacist

## 2018-01-07 DIAGNOSIS — Z5181 Encounter for therapeutic drug level monitoring: Secondary | ICD-10-CM

## 2018-01-07 DIAGNOSIS — I480 Paroxysmal atrial fibrillation: Secondary | ICD-10-CM

## 2018-01-07 LAB — POCT INR: INR: 3.8 — AB (ref 2.0–3.0)

## 2018-01-07 NOTE — Patient Instructions (Signed)
Description   Skip your Coumadin tomorrow since you have taken your dose today, then continue taking same dosage of 1 tablet daily except 1/2 tablet on Sundays, Tuesdays, and Thursdays. Recheck INR in 3 weeks. Coumadin Clinic [phone]

## 2018-01-09 ENCOUNTER — Other Ambulatory Visit: Payer: Self-pay | Admitting: Interventional Cardiology

## 2018-02-04 ENCOUNTER — Other Ambulatory Visit: Payer: Self-pay | Admitting: Internal Medicine

## 2018-02-04 ENCOUNTER — Ambulatory Visit
Admission: RE | Admit: 2018-02-04 | Discharge: 2018-02-04 | Disposition: A | Discharge status: Home / Self Care | Payer: Medicare Other | Source: Ambulatory Visit | Attending: Internal Medicine | Admitting: Internal Medicine

## 2018-02-04 DIAGNOSIS — S4991XA Unspecified injury of right shoulder and upper arm, initial encounter: Secondary | ICD-10-CM

## 2018-02-05 ENCOUNTER — Ambulatory Visit (INDEPENDENT_AMBULATORY_CARE_PROVIDER_SITE_OTHER): Payer: Medicare Other | Admitting: Pharmacist

## 2018-02-05 DIAGNOSIS — I480 Paroxysmal atrial fibrillation: Secondary | ICD-10-CM | POA: Diagnosis not present

## 2018-02-05 DIAGNOSIS — Z5181 Encounter for therapeutic drug level monitoring: Secondary | ICD-10-CM

## 2018-02-05 LAB — POCT INR: INR: 3.1 — AB (ref 2.0–3.0)

## 2018-02-05 NOTE — Patient Instructions (Signed)
Description   Take 1/2 tablet tomorrow, then continue taking same dosage of 1 tablet daily except 1/2 tablet on Sundays, Tuesdays, and Thursdays. Recheck INR in 3 weeks. Coumadin Clinic [phone]

## 2018-02-08 ENCOUNTER — Encounter: Payer: Medicare Other | Admitting: *Deleted

## 2018-02-08 ENCOUNTER — Telehealth: Payer: Self-pay | Admitting: Cardiology

## 2018-02-08 NOTE — Telephone Encounter (Signed)
Spoke with pt and reminded pt of remote transmission that is due today. Pt verbalized understanding.   

## 2018-02-10 ENCOUNTER — Encounter: Payer: Self-pay | Admitting: Cardiology

## 2018-03-04 ENCOUNTER — Ambulatory Visit (INDEPENDENT_AMBULATORY_CARE_PROVIDER_SITE_OTHER): Payer: Medicare Other | Admitting: Pharmacist

## 2018-03-04 DIAGNOSIS — Z5181 Encounter for therapeutic drug level monitoring: Secondary | ICD-10-CM

## 2018-03-04 DIAGNOSIS — I480 Paroxysmal atrial fibrillation: Secondary | ICD-10-CM

## 2018-03-04 LAB — POCT INR: INR: 3.6 — AB (ref 2.0–3.0)

## 2018-03-04 NOTE — Patient Instructions (Signed)
Description   No warfarin tomorrow (since already took today), then change dose to 1/2 tablet daily except 1 tablet on Mondays, Wednesdays and Fridays . Recheck INR in 2 weeks. Coumadin Clinic [phone]

## 2018-03-18 ENCOUNTER — Ambulatory Visit (INDEPENDENT_AMBULATORY_CARE_PROVIDER_SITE_OTHER): Payer: Medicare Other | Admitting: Pharmacist

## 2018-03-18 DIAGNOSIS — I480 Paroxysmal atrial fibrillation: Secondary | ICD-10-CM

## 2018-03-18 DIAGNOSIS — Z5181 Encounter for therapeutic drug level monitoring: Secondary | ICD-10-CM | POA: Diagnosis not present

## 2018-03-18 LAB — POCT INR: INR: 3 (ref 2.0–3.0)

## 2018-03-18 NOTE — Patient Instructions (Signed)
Description   Continue dose to 1/2 tablet daily except 1 tablet on Mondays, Wednesdays and Fridays. Recheck INR in 3 weeks. Coumadin Clinic [phone]

## 2018-04-08 ENCOUNTER — Ambulatory Visit (INDEPENDENT_AMBULATORY_CARE_PROVIDER_SITE_OTHER): Payer: Medicare Other | Admitting: *Deleted

## 2018-04-08 ENCOUNTER — Encounter (INDEPENDENT_AMBULATORY_CARE_PROVIDER_SITE_OTHER): Payer: Self-pay

## 2018-04-08 DIAGNOSIS — I480 Paroxysmal atrial fibrillation: Secondary | ICD-10-CM | POA: Diagnosis not present

## 2018-04-08 DIAGNOSIS — Z5181 Encounter for therapeutic drug level monitoring: Secondary | ICD-10-CM | POA: Diagnosis not present

## 2018-04-08 LAB — POCT INR: INR: 2.3 (ref 2.0–3.0)

## 2018-04-08 NOTE — Patient Instructions (Signed)
Description   Continue dose to 1/2 tablet daily except 1 tablet on Mondays, Wednesdays and Fridays. Recheck INR in 4 weeks. Coumadin Clinic [phone]     

## 2018-04-23 ENCOUNTER — Encounter: Payer: Self-pay | Admitting: Nurse Practitioner

## 2018-05-10 ENCOUNTER — Other Ambulatory Visit: Payer: Self-pay | Admitting: Interventional Cardiology

## 2018-05-12 ENCOUNTER — Encounter: Payer: Self-pay | Admitting: Nurse Practitioner

## 2018-05-12 ENCOUNTER — Ambulatory Visit (INDEPENDENT_AMBULATORY_CARE_PROVIDER_SITE_OTHER): Payer: Medicare Other | Admitting: Nurse Practitioner

## 2018-05-12 VITALS — BP 128/62 | HR 85 | Ht 64.0 in | Wt 135.0 lb

## 2018-05-12 DIAGNOSIS — I495 Sick sinus syndrome: Secondary | ICD-10-CM

## 2018-05-12 DIAGNOSIS — I4819 Other persistent atrial fibrillation: Secondary | ICD-10-CM | POA: Diagnosis not present

## 2018-05-12 LAB — CUP PACEART INCLINIC DEVICE CHECK
Date Time Interrogation Session: 20191030120038
Implantable Lead Implant Date: 20130702
Implantable Lead Location: 753859
Implantable Pulse Generator Implant Date: 20130702
MDC IDC LEAD IMPLANT DT: 20130702
MDC IDC LEAD LOCATION: 753860

## 2018-05-12 NOTE — Patient Instructions (Signed)
Medication Instructions:  NONE If you need a refill on your cardiac medications before your next appointment, please call your pharmacy.   Lab work: TODAY BMET MAGNESIUM If you have labs (blood work) drawn today and your tests are completely normal, you will receive your results only by: Marland Kitchen MyChart Message (if you have MyChart) OR . A paper copy in the mail If you have any lab test that is abnormal or we need to change your treatment, we will call you to review the results.  Testing/Procedures: NONE  Follow-Up: At [facility], you and your health needs are our priority.  As part of our continuing mission to provide you with exceptional heart care, we have created designated Provider Care Teams.  These Care Teams include your primary Cardiologist (physician) and Advanced Practice Providers (APPs -  Physician Assistants and Nurse Practitioners) who all work together to provide you with the care you need, when you need it. You will need a follow up appointment in 4 weeks.  Please call our office 2 months in advance to schedule this appointment.  You may see Dr Rayann Heman or one of the following Advanced Practice Providers on your designated Care Team:   Chanetta Marshall, NP . Tommye Standard, PA-C  Any Other Special Instructions Will Be Listed Below (If Applicable).  DISCUSS ABLATION

## 2018-05-12 NOTE — Progress Notes (Signed)
Electrophysiology Office Note Date: 05/12/2018  ID:  Jo Hughes, DOB 12/29/1940, MRN [id number]  PCP: Lavone Orn, MD Primary Cardiologist: Tamala Julian Electrophysiologist: Rayann Heman  CC: Pacemaker/AF follow up  Jo Hughes is a 77 y.o. female seen today for Dr Rayann Heman.  She presents today for routine electrophysiology followup.  Since last being seen in our clinic, the patient reports doing relatively well.  She and her husband are separated and going through divorce.  This has caused significant stress.  She is more fatigued since last office visit. She has good days and bad days. She is largely unaware of palpitations and isn't sure if her bad days are when she is in AF.   She denies chest pain, palpitations, dyspnea, PND, orthopnea, nausea, vomiting, dizziness, syncope, edema, weight gain, or early satiety.  Past Medical History:  Diagnosis Date  . Anxiety   . Atrial fibrillation ([place])    persistent  . CAD (coronary artery disease)   . Chronic diastolic CHF (congestive heart failure) ([place])   . 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 Glaucoma   . H/O mitral valve repair 2005   [place].  . H/O tricuspid valve repair   . Hx of acquired endocarditis 1970; 1986   Prior to mitral valve repair   . Hyperlipidemia    cardiac cath clean coronary arties in the past.  . Hypothyroid   . Insomnia 12/11/2015  . Kidney stone 10/2013   "pass it"  . Long term (current) use of anticoagulants    No bleeding  . Macular degeneration    early , Hecker  . OSA (obstructive sleep apnea) 08/14/2015   Mild with AHI 7/hr  . Symptomatic bradycardia    s/p PPM  . Thyroid nodule    Past Surgical History:  Procedure Laterality Date  . ABDOMINAL HYSTERECTOMY  1986  . ATRIAL FIBRILLATION ABLATION  11/08/13   PVI by Dr Rayann Heman   . ATRIAL FIBRILLATION ABLATION N/A 11/08/2013   Procedure: ATRIAL FIBRILLATION ABLATION;  Surgeon: Coralyn Mark, MD;   Location: [place] CATH LAB;  Service: Cardiovascular;  Laterality: N/A;  . BACK SURGERY    . BREAST BIOPSY Left X 2   "benign"  . CARDIAC CATHETERIZATION  X 3  . CARDIOVERSION  ~ 2011; 09/18/2011   ?; Procedure: CARDIOVERSION;  Surgeon: [place] Grooms, MD;  Location: [place];  Service: Cardiovascular;  Laterality: N/A;  . CARDIOVERSION N/A 04/29/2013   Procedure: CARDIOVERSION;  Surgeon: Sinclair Grooms, MD;  Location: [place];  Service: Cardiovascular;  Laterality: N/A;  . CATARACT EXTRACTION W/ INTRAOCULAR LENS  IMPLANT, BILATERAL  ~ 2011  . DILATION AND CURETTAGE OF UTERUS    . INSERT / REPLACE / REMOVE PACEMAKER  01/13/2012   MDT Adapta L implanted by Dr Rayann Heman  . KNEE CARTILAGE SURGERY Left 02/2009   scope; Dr. Berenice Primas  . LUMBAR LAMINECTOMY  1997  . MITRAL VALVE REPAIR  05/2004   [facility]  . PERMANENT PACEMAKER INSERTION N/A 01/13/2012   Procedure: PERMANENT PACEMAKER INSERTION;  Surgeon: [place] Grayer, MD;  Location: [facility] CATH LAB;  Service: Cardiovascular;  Laterality: N/A;  . TEE WITHOUT CARDIOVERSION N/A 11/07/2013   Procedure: TRANSESOPHAGEAL ECHOCARDIOGRAM (TEE);  Surgeon: Pixie Casino, MD;  Location: [facility] ENDOSCOPY;  Service: Cardiovascular;  Laterality: N/A;  . TONSILLECTOMY  1948  . TRICUSPID VALVE SURGERY  05/2004   "repair; [facility]"    Current  Outpatient Medications  Medication Sig Dispense Refill  . ALPRAZolam (XANAX) 0.5 MG tablet Take 0.5 mg by mouth 2 (two) times daily as needed for anxiety.     Marland Kitchen amoxicillin (AMOXIL) 500 MG capsule Take four (4) capsules by mouth one (1) hour prior to dental procedures.    . cycloSPORINE (RESTASIS) 0.05 % ophthalmic emulsion Place 1 drop into both eyes 2 (two) times daily.     Marland Kitchen dofetilide (TIKOSYN) 125 MCG capsule Take 1 capsule by mouth twice daily along with a 250 mcg capsule 60 capsule 11  . dofetilide (TIKOSYN) 250 MCG capsule Take 1 capsule by mouth twice daily along with a 125 mcg capsule 60 capsule 11  .  dorzolamide-timolol (COSOPT) 22.3-6.8 MG/ML ophthalmic solution Place 1 drop into both eyes 2 (two) times daily.    . furosemide (LASIX) 80 MG tablet Take 80 mg by mouth daily.     Marland Kitchen HYDROcodone-acetaminophen (NORCO/VICODIN) 5-325 MG per tablet Take 1 tablet by mouth every 6 (six) hours as needed for moderate pain.    Marland Kitchen latanoprost (XALATAN) 0.005 % ophthalmic solution Place 1 drop into both eyes at bedtime.     Marland Kitchen levothyroxine (SYNTHROID, LEVOTHROID) 88 MCG tablet Take 88 mcg by mouth daily.    . metoprolol succinate (TOPROL-XL) 25 MG 24 hr tablet TAKE 2 TABLETS BY MOUTH ONCE DAILY. 60 tablet 10  . Multiple Vitamins-Minerals (ICAPS) CAPS Take 1 capsule by mouth 2 (two) times daily.     . Omega-3 Fatty Acids (FISH OIL) 1000 MG CAPS Take 1,000 mg by mouth 4 (four) times daily - after meals and at bedtime.    Marland Kitchen PARoxetine (PAXIL) 20 MG tablet Take 1 tablet (20 mg total) by mouth daily. 90 tablet 3  . potassium chloride SA (K-DUR,KLOR-CON) 20 MEQ tablet TAKE 1 TABLET BY MOUTH TWICE DAILY. 60 tablet 11  . rosuvastatin (CRESTOR) 40 MG tablet Take 40 mg by mouth daily.    . traMADol (ULTRAM) 50 MG tablet Take 50 mg by mouth at bedtime as needed for moderate pain. For pain    . warfarin (COUMADIN) 5 MG tablet TAKE AS DIRECTED BY COUMADIN CLINIC. 30 tablet 3  . ZIOPTAN 0.0015 % SOLN Place 1 drop into both eyes at bedtime.     Marland Kitchen zolmitriptan (ZOMIG) 5 MG tablet Take 5 mg by mouth daily as needed for migraine.      No current facility-administered medications for this visit.     Allergies:   Citalopram hydrobromide and Trazodone and nefazodone   Social History: Social History   Socioeconomic History  . Marital status: Married    Spouse name: Not on file  . Number of children: Not on file  . Years of education: Not on file  . Highest education level: Not on file  Occupational History  . Not on file  Social Needs  . Financial resource strain: Not on file  . Food insecurity:    Worry: Not on  file    Inability: Not on file  . Transportation needs:    Medical: Not on file    Non-medical: Not on file  Tobacco Use  . Smoking status: Former Smoker    Packs/day: 1.00    Years: 24.00    Pack years: 24.00    Types: Cigarettes    Last attempt to quit: 07/15/1987    Years since quitting: 30.8  . Smokeless tobacco: Never Used  Substance and Sexual Activity  . Alcohol use: Yes    Alcohol/week: 14.0  standard drinks    Types: 14 Glasses of wine per week    Comment: "probably 2, 4oz glasses of wine/night"  . Drug use: No  . Sexual activity: Not Currently  Lifestyle  . Physical activity:    Days per week: Not on file    Minutes per session: Not on file  . Stress: Not on file  Relationships  . Social connections:    Talks on phone: Not on file    Gets together: Not on file    Attends religious service: Not on file    Active member of club or organization: Not on file    Attends meetings of clubs or organizations: Not on file    Relationship status: Not on file  . Intimate partner violence:    Fear of current or ex partner: Not on file    Emotionally abused: Not on file    Physically abused: Not on file    Forced sexual activity: Not on file  Other Topics Concern  . Not on file  Social History Narrative   Lives in [place] with spouse.  No children.  Retired Education officer, museum.    Family History: Family History  Problem Relation Age of Onset  . Stroke Father   . Heart attack Father 31  . Heart disease Father   . Hyperlipidemia Father   . Hypertension Father   . Macular degeneration Mother   . Transient ischemic attack Mother        multiple  . Varicose Veins Mother   . Hypertension Brother     Review of Systems: All other systems reviewed and are otherwise negative except as noted above.   Physical Exam: VS:  BP 128/62   Pulse 85   Ht 5\' 4"  (1.626 m)   Wt 135 lb (61.2 kg)   SpO2 99%   BMI 23.17 kg/m  , BMI Body mass index is 23.17 kg/m. Wt Readings from  Last 3 Encounters:  05/12/18 135 lb (61.2 kg)  11/09/17 134 lb (60.8 kg)  09/02/17 135 lb 6.4 oz (61.4 kg)    GEN- The patient is elderly appearing, alert and oriented x 3 today.   HEENT: normocephalic, atraumatic; sclera clear, conjunctiva pink; hearing intact; oropharynx clear; neck supple  Lungs- Clear to ausculation bilaterally, normal work of breathing.  No wheezes, rales, rhonchi Heart- Regular rate and rhythm  GI- soft, non-tender, non-distended, bowel sounds present  Extremities- no clubbing, cyanosis, or edema  MS- no significant deformity or atrophy Skin- warm and dry, no rash or lesion  Psych- euthymic mood, full affect Neuro- strength and sensation are intact   EKG:  EKG is ordered today. The ekg ordered today shows atrial pacing with intrinsic ventricular conduction, QTc 415msec when calculated manually, 1st degree AV block  Recent Labs: 11/09/2017: Magnesium 2.5    Other studies Reviewed: Additional studies/ records that were reviewed today include:Dr Allred's office notes  Assessment and Plan:  1.  SSS Normal pacemaker function See PaceArt report No changes today  2.  Persistent atrial fibrillation/atypical atrial flutter Burden by device interrogation 29.2% - significantly increased from last office visit in April.  BMET, Mg today QTc stable by EKG today Continue Warfarin for CHADS2VASC of 4 We discussed treatment options for her AF. For now, she would like to continue Tikosyn. We discussed amiodarone (she was on amiodarone prior to first ablation in 2015) vs repeat ablation as options today. She will try to keep track of symptoms of fatigue between now and  follow up so we can correlate with AF episodes. She does have LAE by last echo.  Follow up with Dr Rayann Heman 4 weeks.    Current medicines are reviewed at length with the patient today.   The patient does not have concerns regarding her medicines.  The following changes were made today:  none  Labs/ tests  ordered today include: BMET, Mg Orders Placed This Encounter  Procedures  . Basic Metabolic Panel (BMET)  . Magnesium  . CUP PACEART [place]  . EKG 12-Lead     Disposition:   Follow up with [facility], Dr Rayann Heman in 4 weeks   Signed, Chanetta Marshall, NP 05/12/2018 1:44 PM   [address] [place] [address] [phone] (office) [phone] (fax)

## 2018-05-13 LAB — BASIC METABOLIC PANEL
BUN / CREAT RATIO: 24 (ref 12–28)
BUN: 22 mg/dL (ref 8–27)
CALCIUM: 10.5 mg/dL — AB (ref 8.7–10.3)
CHLORIDE: 102 mmol/L (ref 96–106)
CO2: 25 mmol/L (ref 20–29)
CREATININE: 0.93 mg/dL (ref 0.57–1.00)
GFR calc non Af Amer: 60 mL/min/{1.73_m2} (ref >59)
GFR, EST AFRICAN AMERICAN: 70 mL/min/{1.73_m2} (ref >59)
Glucose: 87 mg/dL (ref 65–99)
Potassium: 4.2 mmol/L (ref 3.5–5.2)
Sodium: 142 mmol/L (ref 134–144)

## 2018-05-13 LAB — MAGNESIUM: Magnesium: 2.3 mg/dL (ref 1.6–2.3)

## 2018-06-08 ENCOUNTER — Ambulatory Visit (INDEPENDENT_AMBULATORY_CARE_PROVIDER_SITE_OTHER): Payer: Medicare Other | Admitting: *Deleted

## 2018-06-08 DIAGNOSIS — Z5181 Encounter for therapeutic drug level monitoring: Secondary | ICD-10-CM | POA: Diagnosis not present

## 2018-06-08 DIAGNOSIS — I480 Paroxysmal atrial fibrillation: Secondary | ICD-10-CM | POA: Diagnosis not present

## 2018-06-08 LAB — POCT INR: INR: 2 (ref 2.0–3.0)

## 2018-06-08 NOTE — Patient Instructions (Signed)
Description   Continue dose to 1/2 tablet daily except 1 tablet on Mondays, Wednesdays and Fridays. Recheck INR in 4 weeks. Coumadin Clinic [phone]

## 2018-06-14 ENCOUNTER — Encounter: Payer: Self-pay | Admitting: Internal Medicine

## 2018-06-14 ENCOUNTER — Ambulatory Visit (INDEPENDENT_AMBULATORY_CARE_PROVIDER_SITE_OTHER): Payer: Medicare Other | Admitting: Internal Medicine

## 2018-06-14 VITALS — BP 110/64 | HR 81 | Ht 64.0 in | Wt 129.8 lb

## 2018-06-14 DIAGNOSIS — Z01812 Encounter for preprocedural laboratory examination: Secondary | ICD-10-CM

## 2018-06-14 DIAGNOSIS — I480 Paroxysmal atrial fibrillation: Secondary | ICD-10-CM

## 2018-06-14 DIAGNOSIS — R001 Bradycardia, unspecified: Secondary | ICD-10-CM | POA: Diagnosis not present

## 2018-06-14 LAB — CUP PACEART INCLINIC DEVICE CHECK
Battery Impedance: 733 Ohm
Brady Statistic AP VP Percent: 1 %
Brady Statistic AP VS Percent: 82 %
Brady Statistic AS VP Percent: 2 %
Brady Statistic AS VS Percent: 15 %
Date Time Interrogation Session: 20191202170544
Implantable Lead Implant Date: 20130702
Implantable Lead Implant Date: 20130702
Implantable Lead Location: 753859
Implantable Lead Model: 5076
Implantable Lead Model: 5092
Lead Channel Impedance Value: 541 Ohm
Lead Channel Impedance Value: 711 Ohm
Lead Channel Pacing Threshold Amplitude: 1 V
Lead Channel Sensing Intrinsic Amplitude: 11.2 mV
MDC IDC LEAD LOCATION: 753860
MDC IDC MSMT BATTERY REMAINING LONGEVITY: 74 mo
MDC IDC MSMT BATTERY VOLTAGE: 2.77 V
MDC IDC MSMT LEADCHNL RA PACING THRESHOLD PULSEWIDTH: 0.4 ms
MDC IDC MSMT LEADCHNL RV PACING THRESHOLD AMPLITUDE: 0.75 V
MDC IDC MSMT LEADCHNL RV PACING THRESHOLD PULSEWIDTH: 0.76 ms
MDC IDC PG IMPLANT DT: 20130702
MDC IDC SET LEADCHNL RA PACING AMPLITUDE: 2 V
MDC IDC SET LEADCHNL RV PACING AMPLITUDE: 2.5 V
MDC IDC SET LEADCHNL RV PACING PULSEWIDTH: 0.76 ms
MDC IDC SET LEADCHNL RV SENSING SENSITIVITY: 4 mV

## 2018-06-14 NOTE — Patient Instructions (Addendum)
Medication Instructions:  Your physician recommends that you continue on your current medications as directed. Please refer to the Current Medication list given to you today.  *If you need a refill on your cardiac medications before your next appointment, please call your pharmacy.  Labwork: Your physician recommends that you return for pre procedure lab work between 12/17 - 1/6 for BMP & CBC. *Will notify you of abnormal results, otherwise continue current treatment plan.  Testing/Procedures: Your physician has requested that you have cardiac CT within 7 days PRIOR to your ablation. Cardiac computed tomography (CT) is a painless test that uses an x-ray machine to take clear, detailed pictures of your heart. For further information please visit [email or web address]. Please follow instruction below located under special instructions.  Your physician has recommended that you have an ablation. Catheter ablation is a medical procedure used to treat some cardiac arrhythmias (irregular heartbeats). During catheter ablation, a long, thin, flexible tube is put into a blood vessel in your groin (upper thigh), or neck. This tube is called an ablation catheter. It is then guided to your heart through the blood vessel. Radio frequency waves destroy small areas of heart tissue where abnormal heartbeats may cause an arrhythmia to start. Please see the instructions below located under special instructions  Follow-Up: Your physician recommends that you schedule a follow-up appointment in: 4 weeks, after your procedure on 07/27/2018, with Roderic Palau NP in the AFib clinic.  Your physician recommends that you schedule a follow up appointment in: 3 months, after your procedure on 07/27/2018, with Dr. Rayann Heman.  *Please note that any paperwork needing to be filled out by the provider will need to be addressed at the front desk prior to seeing the provider. Please note that any FMLA, disability or other documents  regarding health condition is subject to a $25.00 charge that must be received prior to completion of paperwork in the form of a money order or check.  Thank you for choosing [facility]!! [phone]   Any Other Special Instructions Will Be Listed Below   CARDIAC CT INSTRUCTIONS:  Please arrive at the [facility] main entrance of [facility] at _____ AM (30-45 minutes prior to test start time) [facility], [place] [address] [phone]  Proceed to the [facility] Radiology Department (First Floor).  Please follow these instructions carefully (unless otherwise directed):  On the Night Before the Test:  Drink plenty of water.  Do not consume any caffeinated/decaffeinated beverages or chocolate 12 hours prior to your test.  Do not take any antihistamines 12 hours prior to your test.  On the Day of the Test:  Drink plenty of water. Do not drink any water within one hour of the test.  Do not eat any food 4 hours prior to the test.  You may take your regular medications prior to the test.  MAKE SURE TO TAKE YOUR METOPROLOL (TOPROL) TWO HOURS BEFORE THIS TEST  HOLD your Lasix the day of this test.  After the Test:  Drink plenty of water.  After receiving IV contrast, you may experience a mild flushed feeling. This is normal.  On occasion, you may experience a mild rash up to 24 hours after the test. This is not dangerous. If this occurs, you can take Benadryl 25 mg and increase your fluid intake.  If you experience trouble breathing, this can be serious. If it is severe call 911 IMMEDIATELY. If it is mild, please call our  office.    Instructions for your ablation: 1. Please arrive at the [facility], Main Entrance "A", of [facility] at 5:30 A.M. on 07/20/2018. 2. Do not eat or drink after midnight the night prior to the procedure. 3. Do not miss any doses of COUMADIN prior to the morning of the  procedure.  4. Do not take any medications the morning of the procedure. 5. Plan for an overnight stay in the hospital. 6. You will need someone to drive you home at discharge.    Cardiac Ablation Cardiac ablation is a procedure to disable (ablate) a small amount of heart tissue in very specific places. The heart has many electrical connections. Sometimes these connections are abnormal and can cause the heart to beat very fast or irregularly. Ablating some of the problem areas can improve the heart rhythm or return it to normal. Ablation may be done for people who:  Have Wolff-Parkinson-White syndrome.  Have fast heart rhythms (tachycardia).  Have taken medicines for an abnormal heart rhythm (arrhythmia) that were not effective or caused side effects.  Have a high-risk heartbeat that may be life-threatening.   During the procedure, a small incision is made in the neck or the groin, and a long, thin, flexible tube (catheter) is inserted into the incision and moved to the heart. Small devices (electrodes) on the tip of the catheter will send out electrical currents. A type of X-ray (fluoroscopy) will be used to help guide the catheter and to provide images of the heart. Tell a health care provider about:  Any allergies you have.  All medicines you are taking, including vitamins, herbs, eye drops, creams, and over-the-counter medicines.  Any problems you or family members have had with anesthetic medicines.  Any blood disorders you have.  Any surgeries you have had.  Any medical conditions you have, such as kidney failure.  Whether you are pregnant or may be pregnant. What are the risks? Generally, this is a safe procedure. However, problems may occur, including:  Infection.  Bruising and bleeding at the catheter insertion site.  Bleeding into the chest, especially into the sac that surrounds the heart. This is a serious complication.  Stroke or blood clots.  Damage to  other structures or organs.  Allergic reaction to medicines or dyes.  Need for a permanent pacemaker if the normal electrical system is damaged. A pacemaker is a small computer that sends electrical signals to the heart and helps your heart beat normally.  The procedure not being fully effective. This may not be recognized until months later. Repeat ablation procedures are sometimes required.  What happens before the procedure?  Follow instructions from your health care provider about eating or drinking restrictions.  Ask your health care provider about: ? Changing or stopping your regular medicines. This is especially important if you are taking diabetes medicines or blood thinners. ? Taking medicines such as aspirin and ibuprofen. These medicines can thin your blood. Do not take these medicines before your procedure if your health care provider instructs you not to.  Plan to have someone take you home from the hospital or clinic.  If you will be going home right after the procedure, plan to have someone with you for 24 hours. What happens during the procedure?  To lower your risk of infection: ? Your health care team will wash or sanitize their hands. ? Your skin will be washed with soap. ? Hair may be removed from the incision area.  An IV  tube will be inserted into one of your veins.  You will be given a medicine to help you relax (sedative).  The skin on your neck or groin will be numbed.  An incision will be made in your neck or your groin.  A needle will be inserted through the incision and into a large vein in your neck or groin.  A catheter will be inserted into the needle and moved to your heart.  Dye may be injected through the catheter to help your surgeon see the area of the heart that needs treatment.  Electrical currents will be sent from the catheter to ablate heart tissue in desired areas. There are three types of energy that may be used to ablate heart  tissue: ? Heat (radiofrequency energy). ? Laser energy. ? Extreme cold (cryoablation).  When the necessary tissue has been ablated, the catheter will be removed.  Pressure will be held on the catheter insertion area to prevent excessive bleeding.  A bandage (dressing) will be placed over the catheter insertion area. The procedure may vary among health care providers and hospitals. What happens after the procedure?  Your blood pressure, heart rate, breathing rate, and blood oxygen level will be monitored until the medicines you were given have worn off.  Your catheter insertion area will be monitored for bleeding. You will need to lie still for a few hours to ensure that you do not bleed from the catheter insertion area.  Do not drive for 24 hours or as long as directed by your health care provider. Summary  Cardiac ablation is a procedure to disable (ablate) a small amount of heart tissue in very specific places. Ablating some of the problem areas can improve the heart rhythm or return it to normal.  During the procedure, electrical currents will be sent from the catheter to ablate heart tissue in desired areas. This information is not intended to replace advice given to you by your health care provider. Make sure you discuss any questions you have with your health care provider. Document Released: 11/16/2008 Document Revised: 05/19/2016 Document Reviewed: 05/19/2016 Elsevier Interactive Patient Education  [employer].

## 2018-06-14 NOTE — Progress Notes (Signed)
PCP: Lavone Orn, MD Primary Cardiologist: Dr Tamala Julian Primary EP:  Dr Selinda Orion is a 77 y.o. female who presents today for routine electrophysiology followup.  Since last being seen in our clinic, the patient reports doing reasonably well. She continues to have afib.  She has fatigue and decreased exercise tolerance.  Today, she denies symptoms of palpitations, chest pain, shortness of breath,  lower extremity edema, dizziness, presyncope, or syncope.  The patient is otherwise without complaint today.   Past Medical History:  Diagnosis Date  . Anxiety   . Atrial fibrillation ([place])    persistent  . CAD (coronary artery disease)   . Chronic diastolic CHF (congestive heart failure) ([place])   . 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 Glaucoma   . H/O mitral valve repair 2005   [place].  . H/O tricuspid valve repair   . Hx of acquired endocarditis 1970; 1986   Prior to mitral valve repair   . Hyperlipidemia    cardiac cath clean coronary arties in the past.  . Hypothyroid   . Insomnia 12/11/2015  . Kidney stone 10/2013   "pass it"  . Long term (current) use of anticoagulants    No bleeding  . Macular degeneration    early , [place]  . OSA (obstructive sleep apnea) 08/14/2015   Mild with AHI 7/hr  . Symptomatic bradycardia    s/p PPM  . Thyroid nodule    Past Surgical History:  Procedure Laterality Date  . ABDOMINAL HYSTERECTOMY  1986  . ATRIAL FIBRILLATION ABLATION  11/08/13   PVI by Dr Rayann Heman   . ATRIAL FIBRILLATION ABLATION N/A 11/08/2013   Procedure: ATRIAL FIBRILLATION ABLATION;  Surgeon: Coralyn Mark, MD;  Location: [facility] CATH LAB;  Service: Cardiovascular;  Laterality: N/A;  . BACK SURGERY    . BREAST BIOPSY Left X 2   "benign"  . CARDIAC CATHETERIZATION  X 3  . CARDIOVERSION  ~ 2011; 09/18/2011   ?; Procedure: CARDIOVERSION;  Surgeon: Sinclair Grooms, MD;  Location: [facility];  Service: Cardiovascular;   Laterality: N/A;  . CARDIOVERSION N/A 04/29/2013   Procedure: CARDIOVERSION;  Surgeon: Sinclair Grooms, MD;  Location: [facility];  Service: Cardiovascular;  Laterality: N/A;  . CATARACT EXTRACTION W/ INTRAOCULAR LENS  IMPLANT, BILATERAL  ~ 2011  . DILATION AND CURETTAGE OF UTERUS    . INSERT / REPLACE / REMOVE PACEMAKER  01/13/2012   MDT Adapta L implanted by Dr Rayann Heman  . KNEE CARTILAGE SURGERY Left 02/2009   scope; Dr. Berenice Primas  . LUMBAR LAMINECTOMY  1997  . MITRAL VALVE REPAIR  05/2004   [facility]  . PERMANENT PACEMAKER INSERTION N/A 01/13/2012   Procedure: PERMANENT PACEMAKER INSERTION;  Surgeon: Thompson Grayer, MD;  Location: [facility] CATH LAB;  Service: Cardiovascular;  Laterality: N/A;  . TEE WITHOUT CARDIOVERSION N/A 11/07/2013   Procedure: TRANSESOPHAGEAL ECHOCARDIOGRAM (TEE);  Surgeon: Pixie Casino, MD;  Location: [facility] ENDOSCOPY;  Service: Cardiovascular;  Laterality: N/A;  . TONSILLECTOMY  1948  . TRICUSPID VALVE SURGERY  05/2004   "repair; [facility]"    ROS- all systems are reviewed and negative except as per HPI above  Current Outpatient Medications  Medication Sig Dispense Refill  . ALPRAZolam (XANAX) 0.5 MG tablet Take 0.5 mg by mouth 2 (two) times daily as needed for anxiety.     Marland Kitchen amoxicillin (AMOXIL) 500 MG capsule Take four (4) capsules  by mouth one (1) hour prior to dental procedures.    . cycloSPORINE (RESTASIS) 0.05 % ophthalmic emulsion Place 1 drop into both eyes 2 (two) times daily.     Marland Kitchen dofetilide (TIKOSYN) 125 MCG capsule Take 1 capsule by mouth twice daily along with a 250 mcg capsule 60 capsule 11  . dofetilide (TIKOSYN) 250 MCG capsule Take 1 capsule by mouth twice daily along with a 125 mcg capsule 60 capsule 11  . dorzolamide-timolol (COSOPT) 22.3-6.8 MG/ML ophthalmic solution Place 1 drop into both eyes 2 (two) times daily.    . furosemide (LASIX) 80 MG tablet Take 80 mg by mouth daily.     Marland Kitchen HYDROcodone-acetaminophen (NORCO/VICODIN) 5-325 MG per  tablet Take 1 tablet by mouth every 6 (six) hours as needed for moderate pain.    Marland Kitchen latanoprost (XALATAN) 0.005 % ophthalmic solution Place 1 drop into both eyes at bedtime.     Marland Kitchen levothyroxine (SYNTHROID, LEVOTHROID) 88 MCG tablet Take 88 mcg by mouth daily.    . metoprolol succinate (TOPROL-XL) 25 MG 24 hr tablet TAKE 2 TABLETS BY MOUTH ONCE DAILY. 60 tablet 10  . Multiple Vitamins-Minerals (ICAPS) CAPS Take 1 capsule by mouth 2 (two) times daily.     . Omega-3 Fatty Acids (FISH OIL) 1000 MG CAPS Take 1,000 mg by mouth 4 (four) times daily - after meals and at bedtime.    Marland Kitchen PARoxetine (PAXIL) 20 MG tablet Take 1 tablet (20 mg total) by mouth daily. 90 tablet 3  . potassium chloride SA (K-DUR,KLOR-CON) 20 MEQ tablet TAKE 1 TABLET BY MOUTH TWICE DAILY. 60 tablet 11  . rosuvastatin (CRESTOR) 40 MG tablet Take 40 mg by mouth daily.    . traMADol (ULTRAM) 50 MG tablet Take 50 mg by mouth at bedtime as needed for moderate pain. For pain    . warfarin (COUMADIN) 5 MG tablet TAKE AS DIRECTED BY COUMADIN CLINIC. 30 tablet 3  . ZIOPTAN 0.0015 % SOLN Place 1 drop into both eyes at bedtime.     Marland Kitchen zolmitriptan (ZOMIG) 5 MG tablet Take 5 mg by mouth daily as needed for migraine.      No current facility-administered medications for this visit.     Physical Exam: Vitals:   06/14/18 1423  BP: 110/64  Pulse: 81  SpO2: 99%  Weight: 129 lb 12.8 oz (58.9 kg)  Height: 5\' 4"  (1.626 m)    GEN- The patient is well appearing, alert and oriented x 3 today.   Head- normocephalic, atraumatic Eyes-  Sclera clear, conjunctiva pink Ears- hearing intact Oropharynx- clear Lungs- Clear to ausculation bilaterally, normal work of breathing Chest- pacemaker pocket is well healed Heart- Regular rate and rhythm, no murmurs, rubs or gallops, PMI not laterally displaced GI- soft, NT, ND, + BS Extremities- no clubbing, cyanosis, or edema  Pacemaker interrogation- reviewed in detail today,  See PACEART report  ekg  tracing ordered today is personally reviewed and shows atrial paced rhythm, RBBB, LAHB, Qtc 474 msec  Assessment and Plan:  1. Symptomatic sinus bradycardia  Normal pacemaker function See Pace Art report No changes today  2. Persistent afib/ atypical atrial flutter AF burden is 20% (4% upon last visit with me) She is on tikosyn Severe LA enlargement noted (64mm, 35ml) She is on coumadin The patient has symptomatic, recurrent paroxysmal atrial fibrillation and atypical atrial flutter.  Therapeutic strategies for afib including medicine and ablation were discussed in detail with the patient today. Risk, benefits, and alternatives to EP study  and radiofrequency ablation for afib were also discussed in detail today. These risks include but are not limited to stroke, bleeding, vascular damage, tamponade, perforation, damage to the esophagus, lungs, and other structures, pulmonary vein stenosis, worsening renal function, and death. The patient understands these risk and wishes to proceed.  We will therefore proceed with catheter ablation at the next available time.  Carto, ICE, anesthesia are requested for the procedure.  Will also obtain cardiac CT prior to the procedure to exclude LAA thrombus and further evaluate atrial anatomy.  Weekly INRs for 2-3 weeks prior to ablation as well as INR the day before is advised.  3. Valvular heart disease S/p MVR On coumadin  [facility]   Thompson Grayer MD, [facility] 06/14/2018 2:35 PM

## 2018-06-29 ENCOUNTER — Other Ambulatory Visit: Payer: Medicare Other

## 2018-06-29 ENCOUNTER — Ambulatory Visit (INDEPENDENT_AMBULATORY_CARE_PROVIDER_SITE_OTHER): Payer: Medicare Other

## 2018-06-29 DIAGNOSIS — Z5181 Encounter for therapeutic drug level monitoring: Secondary | ICD-10-CM | POA: Diagnosis not present

## 2018-06-29 DIAGNOSIS — I4891 Unspecified atrial fibrillation: Secondary | ICD-10-CM | POA: Diagnosis not present

## 2018-06-29 DIAGNOSIS — I480 Paroxysmal atrial fibrillation: Secondary | ICD-10-CM

## 2018-06-29 DIAGNOSIS — Z01812 Encounter for preprocedural laboratory examination: Secondary | ICD-10-CM

## 2018-06-29 LAB — POCT INR: INR: 2.9 (ref 2.0–3.0)

## 2018-06-29 NOTE — Patient Instructions (Signed)
Description   Continue dose to 1/2 tablet daily except 1 tablet on Mondays, Wednesdays and Fridays. Recheck INR in 1 week, pending ablation on 07/27/18. Coumadin Clinic [phone]

## 2018-06-30 LAB — BASIC METABOLIC PANEL
BUN / CREAT RATIO: 19 (ref 12–28)
BUN: 20 mg/dL (ref 8–27)
CO2: 25 mmol/L (ref 20–29)
CREATININE: 1.04 mg/dL — AB (ref 0.57–1.00)
Calcium: 10.5 mg/dL — ABNORMAL HIGH (ref 8.7–10.3)
Chloride: 101 mmol/L (ref 96–106)
GFR calc Af Amer: 60 mL/min/{1.73_m2} (ref >59)
GFR, EST NON AFRICAN AMERICAN: 52 mL/min/{1.73_m2} — AB (ref >59)
GLUCOSE: 87 mg/dL (ref 65–99)
Potassium: 4.4 mmol/L (ref 3.5–5.2)
Sodium: 139 mmol/L (ref 134–144)

## 2018-06-30 LAB — CBC
Hematocrit: 43.1 % (ref 34.0–46.6)
Hemoglobin: 14.6 g/dL (ref 11.1–15.9)
MCH: 28.9 pg (ref 26.6–33.0)
MCHC: 33.9 g/dL (ref 31.5–35.7)
MCV: 85 fL (ref 79–97)
PLATELETS: 200 10*3/uL (ref 150–450)
RBC: 5.06 x10E6/uL (ref 3.77–5.28)
RDW: 12.7 % (ref 12.3–15.4)
WBC: 7.9 10*3/uL (ref 3.4–10.8)

## 2018-07-09 ENCOUNTER — Ambulatory Visit (INDEPENDENT_AMBULATORY_CARE_PROVIDER_SITE_OTHER): Payer: Medicare Other | Admitting: *Deleted

## 2018-07-09 DIAGNOSIS — I480 Paroxysmal atrial fibrillation: Secondary | ICD-10-CM

## 2018-07-09 DIAGNOSIS — Z5181 Encounter for therapeutic drug level monitoring: Secondary | ICD-10-CM | POA: Diagnosis not present

## 2018-07-09 LAB — POCT INR: INR: 2.3 (ref 2.0–3.0)

## 2018-07-09 NOTE — Patient Instructions (Addendum)
Description   Today and tomorrow take 1 tablet, then Continue dose to 1/2 tablet daily except 1 tablet on Mondays, Wednesdays and Fridays. EAT 1 serving of leafy green vegetable early next week.  Recheck INR in 1 week, pending ablation on 07/27/18. Coumadin Clinic [phone]

## 2018-07-15 ENCOUNTER — Ambulatory Visit (INDEPENDENT_AMBULATORY_CARE_PROVIDER_SITE_OTHER): Payer: Medicare Other

## 2018-07-15 DIAGNOSIS — I480 Paroxysmal atrial fibrillation: Secondary | ICD-10-CM | POA: Diagnosis not present

## 2018-07-15 DIAGNOSIS — Z5181 Encounter for therapeutic drug level monitoring: Secondary | ICD-10-CM | POA: Diagnosis not present

## 2018-07-15 LAB — POCT INR: INR: 2.1 (ref 2.0–3.0)

## 2018-07-15 NOTE — Patient Instructions (Signed)
Description   Take 1 tablet today, then start taking 1/2 tablet daily except 1 tablet on Mondays, Wednesdays, Fridays, and Sundays. Recheck INR in 1 week, pending ablation on 07/27/18. Coumadin Clinic [phone]

## 2018-07-21 ENCOUNTER — Telehealth (HOSPITAL_COMMUNITY): Payer: Self-pay | Admitting: Emergency Medicine

## 2018-07-21 NOTE — Telephone Encounter (Signed)
Reaching out to patient to offer assistance regarding upcoming cardiac imaging study; pt verbalizes understanding of appt date/time, parking situation and where to check in, pre-test NPO status and medications ordered, and verified current allergies; name and call back number provided for further questions should they arise Scotlynn Noyes RN Navigator Cardiac Imaging [phone] 

## 2018-07-22 ENCOUNTER — Ambulatory Visit (INDEPENDENT_AMBULATORY_CARE_PROVIDER_SITE_OTHER): Payer: Medicare Other

## 2018-07-22 DIAGNOSIS — Z5181 Encounter for therapeutic drug level monitoring: Secondary | ICD-10-CM | POA: Diagnosis not present

## 2018-07-22 DIAGNOSIS — I480 Paroxysmal atrial fibrillation: Secondary | ICD-10-CM | POA: Diagnosis not present

## 2018-07-22 LAB — POCT INR: INR: 4.8 — AB (ref 2.0–3.0)

## 2018-07-22 NOTE — Patient Instructions (Addendum)
Description   Skip today and tomorrow, then start taking 1/2 tablet daily except 1 tablet on Mondays, Wednesdays, and Fridays. Recheck INR on Monday, pending ablation on 07/27/18. Eat a serving of green leafy vegetables today, then resume your normal weekly green leafy intake.  Coumadin Clinic [phone]

## 2018-07-23 ENCOUNTER — Ambulatory Visit (HOSPITAL_COMMUNITY)
Admission: RE | Admit: 2018-07-23 | Discharge: 2018-07-23 | Disposition: A | Discharge status: Home / Self Care | Payer: Medicare Other | Source: Ambulatory Visit | Attending: Internal Medicine | Admitting: Internal Medicine

## 2018-07-23 ENCOUNTER — Ambulatory Visit (HOSPITAL_COMMUNITY): Admission: RE | Admit: 2018-07-23 | Payer: Medicare Other | Source: Ambulatory Visit

## 2018-07-23 DIAGNOSIS — I480 Paroxysmal atrial fibrillation: Secondary | ICD-10-CM

## 2018-07-23 MED ORDER — IOPAMIDOL (ISOVUE-370) INJECTION 76%
80.0000 mL | Freq: Once | INTRAVENOUS | Status: AC | PRN
  Administered 2018-07-23: 80 mL via INTRAVENOUS

## 2018-07-26 ENCOUNTER — Ambulatory Visit (INDEPENDENT_AMBULATORY_CARE_PROVIDER_SITE_OTHER): Payer: Medicare Other

## 2018-07-26 DIAGNOSIS — Z5181 Encounter for therapeutic drug level monitoring: Secondary | ICD-10-CM

## 2018-07-26 DIAGNOSIS — I480 Paroxysmal atrial fibrillation: Secondary | ICD-10-CM

## 2018-07-26 LAB — POCT INR: INR: 2.2 (ref 2.0–3.0)

## 2018-07-26 NOTE — Patient Instructions (Signed)
Description   Continue on same dosage 1/2 tablet daily except 1 tablet on Mondays, Wednesdays, and Fridays. Recheck in 1 week after ablation.  Coumadin Clinic [phone]

## 2018-07-27 ENCOUNTER — Ambulatory Visit (HOSPITAL_COMMUNITY): Payer: Medicare Other | Admitting: Certified Registered"

## 2018-07-27 ENCOUNTER — Ambulatory Visit (HOSPITAL_COMMUNITY)
Admission: RE | Admit: 2018-07-27 | Discharge: 2018-07-28 | Disposition: A | Discharge status: Home / Self Care | Payer: Medicare Other | Attending: Internal Medicine | Admitting: Internal Medicine

## 2018-07-27 ENCOUNTER — Encounter (HOSPITAL_COMMUNITY): Payer: Self-pay | Admitting: Certified Registered"

## 2018-07-27 ENCOUNTER — Other Ambulatory Visit: Payer: Self-pay

## 2018-07-27 ENCOUNTER — Encounter (HOSPITAL_COMMUNITY)
Admission: RE | Disposition: A | Discharge status: Home / Self Care | Payer: Self-pay | Source: Home / Self Care | Attending: Internal Medicine

## 2018-07-27 DIAGNOSIS — E039 Hypothyroidism, unspecified: Secondary | ICD-10-CM | POA: Insufficient documentation

## 2018-07-27 DIAGNOSIS — Z79899 Other long term (current) drug therapy: Secondary | ICD-10-CM | POA: Insufficient documentation

## 2018-07-27 DIAGNOSIS — Z950 Presence of cardiac pacemaker: Secondary | ICD-10-CM | POA: Diagnosis not present

## 2018-07-27 DIAGNOSIS — R001 Bradycardia, unspecified: Secondary | ICD-10-CM | POA: Diagnosis not present

## 2018-07-27 DIAGNOSIS — Z79890 Hormone replacement therapy (postmenopausal): Secondary | ICD-10-CM | POA: Insufficient documentation

## 2018-07-27 DIAGNOSIS — G4733 Obstructive sleep apnea (adult) (pediatric): Secondary | ICD-10-CM | POA: Diagnosis not present

## 2018-07-27 DIAGNOSIS — I484 Atypical atrial flutter: Secondary | ICD-10-CM | POA: Diagnosis not present

## 2018-07-27 DIAGNOSIS — Z90710 Acquired absence of both cervix and uterus: Secondary | ICD-10-CM | POA: Insufficient documentation

## 2018-07-27 DIAGNOSIS — I5032 Chronic diastolic (congestive) heart failure: Secondary | ICD-10-CM | POA: Diagnosis not present

## 2018-07-27 DIAGNOSIS — Z7901 Long term (current) use of anticoagulants: Secondary | ICD-10-CM | POA: Diagnosis not present

## 2018-07-27 DIAGNOSIS — G4700 Insomnia, unspecified: Secondary | ICD-10-CM | POA: Insufficient documentation

## 2018-07-27 DIAGNOSIS — H409 Unspecified glaucoma: Secondary | ICD-10-CM | POA: Diagnosis not present

## 2018-07-27 DIAGNOSIS — I2510 Atherosclerotic heart disease of native coronary artery without angina pectoris: Secondary | ICD-10-CM | POA: Insufficient documentation

## 2018-07-27 DIAGNOSIS — Z955 Presence of coronary angioplasty implant and graft: Secondary | ICD-10-CM | POA: Diagnosis not present

## 2018-07-27 DIAGNOSIS — E785 Hyperlipidemia, unspecified: Secondary | ICD-10-CM | POA: Insufficient documentation

## 2018-07-27 DIAGNOSIS — I4819 Other persistent atrial fibrillation: Secondary | ICD-10-CM | POA: Diagnosis present

## 2018-07-27 HISTORY — PX: ATRIAL FIBRILLATION ABLATION: EP1191

## 2018-07-27 LAB — POCT ACTIVATED CLOTTING TIME
Activated Clotting Time: 329 seconds
Activated Clotting Time: 158 seconds
Activated Clotting Time: 329 seconds

## 2018-07-27 LAB — PROTIME-INR
INR: 1.88
PROTHROMBIN TIME: 21.4 s — AB (ref 11.4–15.2)

## 2018-07-27 SURGERY — ATRIAL FIBRILLATION ABLATION
Anesthesia: General

## 2018-07-27 MED ORDER — WARFARIN - PHYSICIAN DOSING INPATIENT
Freq: Every day | Status: DC
  Administered 2018-07-27: 19:00:00

## 2018-07-27 MED ORDER — BUPIVACAINE HCL (PF) 0.25 % IJ SOLN
INTRAMUSCULAR | Status: AC
  Filled 2018-07-27: qty 30

## 2018-07-27 MED ORDER — METOPROLOL SUCCINATE ER 50 MG PO TB24
50.0000 mg | ORAL_TABLET | Freq: Every day | ORAL | Status: DC
  Administered 2018-07-28: 50 mg via ORAL
  Filled 2018-07-27: qty 1

## 2018-07-27 MED ORDER — LIDOCAINE 2% (20 MG/ML) 5 ML SYRINGE
INTRAMUSCULAR | Status: DC | PRN
  Administered 2018-07-27: 60 mg via INTRAVENOUS

## 2018-07-27 MED ORDER — WARFARIN SODIUM 5 MG PO TABS
5.0000 mg | ORAL_TABLET | Freq: Once | ORAL | Status: AC
  Administered 2018-07-27: 19:00:00 5 mg via ORAL
  Filled 2018-07-27: qty 1

## 2018-07-27 MED ORDER — MIDAZOLAM HCL 5 MG/5ML IJ SOLN
INTRAMUSCULAR | Status: DC | PRN
  Administered 2018-07-27: 2 mg via INTRAVENOUS

## 2018-07-27 MED ORDER — TAFLUPROST (PF) 0.0015 % OP SOLN: 1.0000 [drp] | Freq: Every day | OPHTHALMIC | Status: DC

## 2018-07-27 MED ORDER — PROTAMINE SULFATE 10 MG/ML IV SOLN
INTRAVENOUS | Status: DC | PRN
  Administered 2018-07-27: 40 mg via INTRAVENOUS

## 2018-07-27 MED ORDER — PAROXETINE HCL 20 MG PO TABS
20.0000 mg | ORAL_TABLET | Freq: Every day | ORAL | Status: DC
  Administered 2018-07-28: 20 mg via ORAL
  Filled 2018-07-27: qty 1

## 2018-07-27 MED ORDER — ACETAMINOPHEN 325 MG PO TABS: 650.0000 mg | ORAL_TABLET | ORAL | Status: DC | PRN

## 2018-07-27 MED ORDER — HEPARIN (PORCINE) IN NACL 1000-0.9 UT/500ML-% IV SOLN
INTRAVENOUS | Status: AC
  Filled 2018-07-27: qty 500

## 2018-07-27 MED ORDER — ALPRAZOLAM 0.5 MG PO TABS
0.5000 mg | ORAL_TABLET | Freq: Two times a day (BID) | ORAL | Status: DC | PRN
  Administered 2018-07-27: 0.5 mg via ORAL
  Filled 2018-07-27: qty 1

## 2018-07-27 MED ORDER — HEPARIN SODIUM (PORCINE) 1000 UNIT/ML IJ SOLN
INTRAMUSCULAR | Status: DC | PRN
  Administered 2018-07-27 (×2): 1000 [IU] via INTRAVENOUS

## 2018-07-27 MED ORDER — DOFETILIDE 250 MCG PO CAPS
375.0000 ug | ORAL_CAPSULE | Freq: Two times a day (BID) | ORAL | Status: DC
  Administered 2018-07-27 – 2018-07-28 (×2): 375 ug via ORAL
  Filled 2018-07-27 (×3): qty 1

## 2018-07-27 MED ORDER — POTASSIUM CHLORIDE CRYS ER 20 MEQ PO TBCR
20.0000 meq | EXTENDED_RELEASE_TABLET | Freq: Two times a day (BID) | ORAL | Status: DC
  Administered 2018-07-27 – 2018-07-28 (×3): 20 meq via ORAL
  Filled 2018-07-27 (×3): qty 1

## 2018-07-27 MED ORDER — TRAMADOL HCL 50 MG PO TABS: 50.0000 mg | ORAL_TABLET | Freq: Two times a day (BID) | ORAL | Status: DC | PRN

## 2018-07-27 MED ORDER — SODIUM CHLORIDE 0.9 % IV SOLN
INTRAVENOUS | Status: DC | PRN
  Administered 2018-07-27: 25 ug/min via INTRAVENOUS

## 2018-07-27 MED ORDER — SUGAMMADEX SODIUM 200 MG/2ML IV SOLN
INTRAVENOUS | Status: DC | PRN
  Administered 2018-07-27: 200 mg via INTRAVENOUS

## 2018-07-27 MED ORDER — HYDROCODONE-ACETAMINOPHEN 5-325 MG PO TABS
1.0000 | ORAL_TABLET | Freq: Four times a day (QID) | ORAL | Status: DC | PRN
  Administered 2018-07-27: 1 via ORAL
  Filled 2018-07-27: qty 1

## 2018-07-27 MED ORDER — DORZOLAMIDE HCL-TIMOLOL MAL 2-0.5 % OP SOLN
1.0000 [drp] | Freq: Two times a day (BID) | OPHTHALMIC | Status: DC
  Administered 2018-07-27 – 2018-07-28 (×2): 1 [drp] via OPHTHALMIC
  Filled 2018-07-27: qty 10

## 2018-07-27 MED ORDER — BUPIVACAINE HCL (PF) 0.25 % IJ SOLN
INTRAMUSCULAR | Status: DC | PRN
  Administered 2018-07-27: 30 mL

## 2018-07-27 MED ORDER — HEPARIN (PORCINE) IN NACL 1000-0.9 UT/500ML-% IV SOLN
INTRAVENOUS | Status: DC | PRN
  Administered 2018-07-27: 500 mL

## 2018-07-27 MED ORDER — DEXAMETHASONE SODIUM PHOSPHATE 10 MG/ML IJ SOLN
INTRAMUSCULAR | Status: DC | PRN
  Administered 2018-07-27: 5 mg via INTRAVENOUS

## 2018-07-27 MED ORDER — WARFARIN - PHARMACIST DOSING INPATIENT: Freq: Every day | Status: DC

## 2018-07-27 MED ORDER — SODIUM CHLORIDE 0.9 % IV SOLN
INTRAVENOUS | Status: DC
  Administered 2018-07-27: 06:00:00 via INTRAVENOUS

## 2018-07-27 MED ORDER — OFF THE BEAT BOOK
Freq: Once | Status: AC
  Administered 2018-07-27: 21:00:00
  Filled 2018-07-27: qty 1

## 2018-07-27 MED ORDER — LATANOPROST 0.005 % OP SOLN
1.0000 [drp] | Freq: Every day | OPHTHALMIC | Status: DC
  Filled 2018-07-27: qty 2.5

## 2018-07-27 MED ORDER — SODIUM CHLORIDE 0.9% FLUSH: 3.0000 mL | INTRAVENOUS | Status: DC | PRN

## 2018-07-27 MED ORDER — FUROSEMIDE 80 MG PO TABS: 80.0000 mg | ORAL_TABLET | Freq: Every evening | ORAL | Status: DC

## 2018-07-27 MED ORDER — PHENYLEPHRINE 40 MCG/ML (10ML) SYRINGE FOR IV PUSH (FOR BLOOD PRESSURE SUPPORT)
PREFILLED_SYRINGE | INTRAVENOUS | Status: DC | PRN
  Administered 2018-07-27: 80 ug via INTRAVENOUS
  Administered 2018-07-27: 40 ug via INTRAVENOUS
  Administered 2018-07-27 (×2): 80 ug via INTRAVENOUS
  Administered 2018-07-27: 40 ug via INTRAVENOUS

## 2018-07-27 MED ORDER — HEPARIN SODIUM (PORCINE) 1000 UNIT/ML IJ SOLN
INTRAMUSCULAR | Status: AC
  Filled 2018-07-27: qty 2

## 2018-07-27 MED ORDER — PROPOFOL 10 MG/ML IV BOLUS
INTRAVENOUS | Status: DC | PRN
  Administered 2018-07-27: 20 mg via INTRAVENOUS
  Administered 2018-07-27: 80 mg via INTRAVENOUS
  Administered 2018-07-27: 20 mg via INTRAVENOUS

## 2018-07-27 MED ORDER — HEPARIN SODIUM (PORCINE) 1000 UNIT/ML IJ SOLN
INTRAMUSCULAR | Status: DC | PRN
  Administered 2018-07-27: 12000 [IU] via INTRAVENOUS
  Administered 2018-07-27: 1000 [IU] via INTRAVENOUS

## 2018-07-27 MED ORDER — LEVOTHYROXINE SODIUM 75 MCG PO TABS
75.0000 ug | ORAL_TABLET | Freq: Every day | ORAL | Status: DC
  Administered 2018-07-28: 07:00:00 75 ug via ORAL
  Filled 2018-07-27: qty 1

## 2018-07-27 MED ORDER — WARFARIN SODIUM 5 MG PO TABS
5.0000 mg | ORAL_TABLET | Freq: Once | ORAL | Status: AC
  Administered 2018-07-27: 5 mg via ORAL
  Filled 2018-07-27: qty 1

## 2018-07-27 MED ORDER — ONDANSETRON HCL 4 MG/2ML IJ SOLN: 4.0000 mg | Freq: Four times a day (QID) | INTRAMUSCULAR | Status: DC | PRN

## 2018-07-27 MED ORDER — ENSURE ENLIVE PO LIQD
237.0000 mL | Freq: Two times a day (BID) | ORAL | Status: DC
  Filled 2018-07-27 (×2): qty 237

## 2018-07-27 MED ORDER — FENTANYL CITRATE (PF) 100 MCG/2ML IJ SOLN
INTRAMUSCULAR | Status: DC | PRN
  Administered 2018-07-27: 50 ug via INTRAVENOUS
  Administered 2018-07-27: 100 ug via INTRAVENOUS

## 2018-07-27 MED ORDER — ONDANSETRON HCL 4 MG/2ML IJ SOLN
INTRAMUSCULAR | Status: DC | PRN
  Administered 2018-07-27: 4 mg via INTRAVENOUS

## 2018-07-27 MED ORDER — CYCLOSPORINE 0.05 % OP EMUL
1.0000 [drp] | Freq: Two times a day (BID) | OPHTHALMIC | Status: DC
  Administered 2018-07-27 – 2018-07-28 (×2): 1 [drp] via OPHTHALMIC
  Filled 2018-07-27 (×3): qty 1

## 2018-07-27 MED ORDER — SODIUM CHLORIDE 0.9% FLUSH
3.0000 mL | Freq: Two times a day (BID) | INTRAVENOUS | Status: DC
  Administered 2018-07-27: 3 mL via INTRAVENOUS

## 2018-07-27 MED ORDER — ROCURONIUM BROMIDE 10 MG/ML (PF) SYRINGE
PREFILLED_SYRINGE | INTRAVENOUS | Status: DC | PRN
  Administered 2018-07-27: 50 mg via INTRAVENOUS

## 2018-07-27 MED ORDER — DIAZEPAM 5 MG PO TABS: 5.0000 mg | ORAL_TABLET | Freq: Once | ORAL | Status: DC

## 2018-07-27 MED ORDER — SODIUM CHLORIDE 0.9 % IV SOLN: 250.0000 mL | INTRAVENOUS | Status: DC | PRN

## 2018-07-27 SURGICAL SUPPLY — 18 items
BLANKET WARM UNDERBOD FULL ACC (MISCELLANEOUS) ×2 IMPLANT
CATH MAPPNG PENTARAY F 2-6-2MM (CATHETERS) IMPLANT
CATH NAVISTAR SMARTTOUCH DF (ABLATOR) ×2 IMPLANT
CATH SOUNDSTAR 3D IMAGING (CATHETERS) ×2 IMPLANT
CATH WEBSTER BI DIR CS D-F CRV (CATHETERS) ×2 IMPLANT
COVER SWIFTLINK CONNECTOR (BAG) ×2 IMPLANT
NDL BAYLIS TRANSSEPTAL 71CM (NEEDLE) IMPLANT
NEEDLE BAYLIS TRANSSEPTAL 71CM (NEEDLE) ×3 IMPLANT
PACK EP LATEX FREE (CUSTOM PROCEDURE TRAY) ×3
PACK EP LF (CUSTOM PROCEDURE TRAY) ×1 IMPLANT
PAD PRO RADIOLUCENT 2001M-C (PAD) ×3 IMPLANT
PATCH CARTO3 (PAD) ×2 IMPLANT
PENTARAY F 2-6-2MM (CATHETERS) ×3
SHEATH AVANTI 11F 11CM (SHEATH) ×2 IMPLANT
SHEATH BAYLIS TORFLEX (SHEATH) ×2 IMPLANT
SHEATH PINNACLE 7F 10CM (SHEATH) ×4 IMPLANT
SHEATH PINNACLE 9F 10CM (SHEATH) ×2 IMPLANT
TUBING SMART ABLATE COOLFLOW (TUBING) ×2 IMPLANT

## 2018-07-27 NOTE — Progress Notes (Signed)
QTc is 0.47 s, checked prior to administration of dofetilide.

## 2018-07-27 NOTE — Anesthesia Preprocedure Evaluation (Addendum)
Anesthesia Evaluation  Patient identified by MRN, date of birth, ID band Patient awake    Reviewed: Allergy & Precautions, NPO status , Patient's Chart, lab work & pertinent test results, reviewed documented beta blocker date and time   History of Anesthesia Complications Negative for: history of anesthetic complications  Airway Mallampati: I  TM Distance: >3 FB Neck ROM: Full    Dental  (+) Teeth Intact   Pulmonary neg shortness of breath, sleep apnea , neg COPD, neg recent URI, former smoker,    breath sounds clear to auscultation       Cardiovascular + CAD and +CHF  + pacemaker + Valvular Problems/Murmurs  Rhythm:Regular  S/p mvr/tvr   Neuro/Psych Anxiety negative neurological ROS     GI/Hepatic negative GI ROS, Neg liver ROS,   Endo/Other  Hypothyroidism   Renal/GU Renal disease     Musculoskeletal  (+) Arthritis ,   Abdominal   Peds  Hematology   Anesthesia Other Findings Left ventricle: The cavity size was normal. Wall thickness was   increased in a pattern of mild LVH. Systolic function was normal.   The estimated ejection fraction was in the range of 60% to 65%.   Wall motion was normal; there were no regional wall motion   abnormalities. Features are consistent with a pseudonormal left   ventricular filling pattern, with concomitant abnormal relaxation   and increased filling pressure (grade 2 diastolic dysfunction).   Doppler parameters are consistent with high ventricular filling   pressure. - Aortic valve: Transvalvular velocity was within the normal range.   There was no stenosis. There was no regurgitation. - Mitral valve: An annular ring prosthesis was present. Mobility of   the posterior leaflet was restricted. Valve area by continuity   equation (using LVOT flow): 1.26 cm^2. - Left atrium: The atrium was severely dilated. - Right ventricle: The cavity size was normal. Wall thickness was  normal. Systolic function was normal. - Atrial septum: No defect or patent foramen ovale was identified. - Tricuspid valve: There was mild regurgitation. - Pulmonary arteries: Systolic pressure was within the normal   range. PA peak pressure: 30 mm Hg (S).  Reproductive/Obstetrics                            Anesthesia Physical Anesthesia Plan  ASA: III  Anesthesia Plan: General   Post-op Pain Management:    Induction: Intravenous  PONV Risk Score and Plan: 3 and Ondansetron and Dexamethasone  Airway Management Planned: Oral ETT  Additional Equipment: None  Intra-op Plan:   Post-operative Plan: Extubation in OR  Informed Consent: I have reviewed the patients History and Physical, chart, labs and discussed the procedure including the risks, benefits and alternatives for the proposed anesthesia with the patient or authorized representative who has indicated his/her understanding and acceptance.   Dental advisory given  Plan Discussed with: CRNA and Surgeon  Anesthesia Plan Comments:         Anesthesia Quick Evaluation

## 2018-07-27 NOTE — Discharge Instructions (Signed)
Post procedure care instructions No driving for 4 days. No lifting over 5 lbs for 1 week. No vigorous or sexual activity for 1 week. You may return to work on 08/04/2018. Keep procedure site clean & dry. If you notice increased pain, swelling, bleeding or pus, call/return!  You may shower, but no soaking baths/hot tubs/pools for 1 week.     You have an appointment set up with the [facility].  Multiple studies have shown that being followed by a dedicated atrial fibrillation clinic in addition to the standard care you receive from your other physicians improves health. We believe that enrollment in the atrial fibrillation clinic will allow [place] to better care for you.   The phone number to the [facility] is [phone]. The clinic is staffed Monday through Friday from 8:30am to 5pm.  Parking Directions: The clinic is located in the Heart and Vascular Building connected to [facility] 1)From [address] turn on to [address] and go to the 3rd entrance  (Heart and Vascular entrance) on the right. 2)Look to the right for Heart &Vascular Parking Garage. 3)A code for the entrance is required please call the clinic to receive this.   4)Take the elevators to the 1st floor. Registration is in the room with the glass walls at the end of the hallway.  If you have any trouble parking or locating the clinic, please dont hesitate to call [phone].

## 2018-07-27 NOTE — Progress Notes (Signed)
Oriented to place, year, month, why she's here, self. Thinks it is Wednesday.

## 2018-07-27 NOTE — Anesthesia Procedure Notes (Signed)
Procedure Name: Intubation Date/Time: 07/27/2018 7:58 AM Performed by: Moshe Salisbury, CRNA Pre-anesthesia Checklist: Patient identified, Emergency Drugs available, Suction available and Patient being monitored Patient Re-evaluated:Patient Re-evaluated prior to induction Oxygen Delivery Method: Circle System Utilized Preoxygenation: Pre-oxygenation with 100% oxygen Induction Type: IV induction Ventilation: Mask ventilation without difficulty Laryngoscope Size: Mac and 3 Grade View: Grade II Tube type: Oral Tube size: 7.5 mm Number of attempts: 2 (First att into esoph and immed recognized and removed. Second attempt - AOI) Airway Equipment and Method: Stylet Placement Confirmation: ETT inserted through vocal cords under direct vision,  positive ETCO2 and breath sounds checked- equal and bilateral Secured at: 20 cm Tube secured with: Tape Dental Injury: Teeth and Oropharynx as per pre-operative assessment

## 2018-07-27 NOTE — Progress Notes (Signed)
Patient confused to time and self and place. Reorients easily. Hand grips equally strong, dorsiflexion equal bilaterally. Speech clear, smile symmetrical, tongue midline. Paged Dr. Rayann Heman.

## 2018-07-27 NOTE — Progress Notes (Signed)
Site area:  3 rt  Groin  fv sheaths Site Prior to Removal:  Level 0 Pressure Applied For:  20 minutes Manual:   yes Patient Status During Pull:  stable Post Pull Site:  Level 0 Post Pull Instructions Given:  yes Post Pull Pulses Present: rt dp palpable Dressing Applied:  Gauze and tegaderm Bedrest begins @ 9242 Comments: IV saline locked

## 2018-07-27 NOTE — H&P (Signed)
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 female who presents today for electrophysiology study and ablation for afib.  Since last being seen in our clinic, the patient reports doing reasonably well. She continues to have afib.  She has fatigue and decreased exercise tolerance.  Today, she denies symptoms of palpitations, chest pain, shortness of breath,  lower extremity edema, dizziness, presyncope, or syncope.  The patient is otherwise without complaint today.       Past Medical History:  Diagnosis Date  . Anxiety   . Atrial fibrillation ([place])    persistent  . CAD (coronary artery disease)   . Chronic diastolic CHF (congestive heart failure) ([place])   . 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 Glaucoma   . H/O mitral valve repair 2005   [place].  . H/O tricuspid valve repair   . Hx of acquired endocarditis 1970; 1986   Prior to mitral valve repair   . Hyperlipidemia    cardiac cath clean coronary arties in the past.  . Hypothyroid   . Insomnia 12/11/2015  . Kidney stone 10/2013   "pass it"  . Long term (current) use of anticoagulants    No bleeding  . Macular degeneration    early , [place]  . OSA (obstructive sleep apnea) 08/14/2015   Mild with AHI 7/hr  . Symptomatic bradycardia    s/p PPM  . Thyroid nodule         Past Surgical History:  Procedure Laterality Date  . ABDOMINAL HYSTERECTOMY  1986  . ATRIAL FIBRILLATION ABLATION  11/08/13   PVI by Dr Rayann Heman   . ATRIAL FIBRILLATION ABLATION N/A 11/08/2013   Procedure: ATRIAL FIBRILLATION ABLATION;  Surgeon: Coralyn Mark, MD;  Location: [facility] CATH LAB;  Service: Cardiovascular;  Laterality: N/A;  . BACK SURGERY    . BREAST BIOPSY Left X 2   "benign"  . CARDIAC CATHETERIZATION  X 3  . CARDIOVERSION  ~ 2011; 09/18/2011   ?; Procedure: CARDIOVERSION;  Surgeon: Sinclair Grooms, MD;   Location: [facility];  Service: Cardiovascular;  Laterality: N/A;  . CARDIOVERSION N/A 04/29/2013   Procedure: CARDIOVERSION;  Surgeon: Sinclair Grooms, MD;  Location: [facility];  Service: Cardiovascular;  Laterality: N/A;  . CATARACT EXTRACTION W/ INTRAOCULAR LENS  IMPLANT, BILATERAL  ~ 2011  . DILATION AND CURETTAGE OF UTERUS    . INSERT / REPLACE / REMOVE PACEMAKER  01/13/2012   MDT Adapta L implanted by Dr Rayann Heman  . KNEE CARTILAGE SURGERY Left 02/2009   scope; Dr. Berenice Primas  . LUMBAR LAMINECTOMY  1997  . MITRAL VALVE REPAIR  05/2004   [facility]  . PERMANENT PACEMAKER INSERTION N/A 01/13/2012   Procedure: PERMANENT PACEMAKER INSERTION;  Surgeon: Thompson Grayer, MD;  Location: [facility] CATH LAB;  Service: Cardiovascular;  Laterality: N/A;  . TEE WITHOUT CARDIOVERSION N/A 11/07/2013   Procedure: TRANSESOPHAGEAL ECHOCARDIOGRAM (TEE);  Surgeon: Pixie Casino, MD;  Location: [facility] ENDOSCOPY;  Service: Cardiovascular;  Laterality: N/A;  . TONSILLECTOMY  1948  . TRICUSPID VALVE SURGERY  05/2004   "repair; [facility]"    ROS- all systems are reviewed and negative except as per HPI above        Current Outpatient Medications  Medication Sig Dispense Refill  . ALPRAZolam (XANAX) 0.5 MG tablet Take 0.5 mg by mouth 2 (two) times daily as needed  for anxiety.     Marland Kitchen amoxicillin (AMOXIL) 500 MG capsule Take four (4) capsules by mouth one (1) hour prior to dental procedures.    . cycloSPORINE (RESTASIS) 0.05 % ophthalmic emulsion Place 1 drop into both eyes 2 (two) times daily.     Marland Kitchen dofetilide (TIKOSYN) 125 MCG capsule Take 1 capsule by mouth twice daily along with a 250 mcg capsule 60 capsule 11  . dofetilide (TIKOSYN) 250 MCG capsule Take 1 capsule by mouth twice daily along with a 125 mcg capsule 60 capsule 11  . dorzolamide-timolol (COSOPT) 22.3-6.8 MG/ML ophthalmic solution Place 1 drop into both eyes 2 (two) times daily.    . furosemide (LASIX) 80 MG tablet Take 80 mg  by mouth daily.     Marland Kitchen HYDROcodone-acetaminophen (NORCO/VICODIN) 5-325 MG per tablet Take 1 tablet by mouth every 6 (six) hours as needed for moderate pain.    Marland Kitchen latanoprost (XALATAN) 0.005 % ophthalmic solution Place 1 drop into both eyes at bedtime.     Marland Kitchen levothyroxine (SYNTHROID, LEVOTHROID) 88 MCG tablet Take 88 mcg by mouth daily.    . metoprolol succinate (TOPROL-XL) 25 MG 24 hr tablet TAKE 2 TABLETS BY MOUTH ONCE DAILY. 60 tablet 10  . Multiple Vitamins-Minerals (ICAPS) CAPS Take 1 capsule by mouth 2 (two) times daily.     . Omega-3 Fatty Acids (FISH OIL) 1000 MG CAPS Take 1,000 mg by mouth 4 (four) times daily - after meals and at bedtime.    Marland Kitchen PARoxetine (PAXIL) 20 MG tablet Take 1 tablet (20 mg total) by mouth daily. 90 tablet 3  . potassium chloride SA (K-DUR,KLOR-CON) 20 MEQ tablet TAKE 1 TABLET BY MOUTH TWICE DAILY. 60 tablet 11  . rosuvastatin (CRESTOR) 40 MG tablet Take 40 mg by mouth daily.    . traMADol (ULTRAM) 50 MG tablet Take 50 mg by mouth at bedtime as needed for moderate pain. For pain    . warfarin (COUMADIN) 5 MG tablet TAKE AS DIRECTED BY COUMADIN CLINIC. 30 tablet 3  . ZIOPTAN 0.0015 % SOLN Place 1 drop into both eyes at bedtime.     Marland Kitchen zolmitriptan (ZOMIG) 5 MG tablet Take 5 mg by mouth daily as needed for migraine.      No current facility-administered medications for this visit.     Physical Exam:    Vitals:   06/14/18 1423  BP: 110/64  Pulse: 81  SpO2: 99%  Weight: 129 lb 12.8 oz (58.9 kg)  Height: 5\' 4"  (1.626 m)    GEN- The patient is well appearing, alert and oriented x 3 today.   Head- normocephalic, atraumatic Eyes-  Sclera clear, conjunctiva pink Ears- hearing intact Oropharynx- clear Lungs- Clear to ausculation bilaterally, normal work of breathing Chest- pacemaker pocket is well healed Heart- Regular rate and rhythm, no murmurs, rubs or gallops, PMI not laterally displaced GI- soft, NT, ND, + BS Extremities- no  clubbing, cyanosis, or edema  Assessment and Plan:  1. Symptomatic sinus bradycardia  S/p MDT PPM previous  2. Persistent afib/ atypical atrial flutter She is on coumadin.  INR today is 1.88.  I will therefore give coumadin 5mg  once now and then give additionally tonight. Therapeutic strategies for afib including medicine and ablation were discussed in detail with the patient today. Risk, benefits, and alternatives to EP study and radiofrequency ablation for afib were also discussed in detail today. These risks include but are not limited to stroke, bleeding, vascular damage, tamponade, perforation, damage to the esophagus, lungs,  and other structures, pulmonary vein stenosis, worsening renal function, and death. The patient understands these risk and wishes to proceed at this time.  Cardiac CT reviewed with her today.  Thompson Grayer MD, [facility] 07/27/2018 7:11 AM

## 2018-07-27 NOTE — Transfer of Care (Signed)
Immediate Anesthesia Transfer of Care Note  Patient: Jo Hughes  Procedure(s) Performed: ATRIAL FIBRILLATION ABLATION (N/A )  Patient Location: PACU  Anesthesia Type:General  Level of Consciousness: awake, oriented and patient cooperative  Airway & Oxygen Therapy: Patient Spontanous Breathing and Patient connected to nasal cannula oxygen  Post-op Assessment: Report given to RN, Post -op Vital signs reviewed and stable and Patient moving all extremities  Post vital signs: Reviewed and stable  Last Vitals:  Vitals Value Taken Time  BP 99/55 07/27/2018 10:17 AM  Temp 36.7 C 07/27/2018 10:16 AM  Pulse 59 07/27/2018 10:18 AM  Resp 18 07/27/2018 10:18 AM  SpO2 95 % 07/27/2018 10:18 AM  Vitals shown include unvalidated device data.  Last Pain:  Vitals:   07/27/18 1016  TempSrc: Temporal  PainSc: 0-No pain         Complications: No apparent anesthesia complications

## 2018-07-27 NOTE — Progress Notes (Signed)
Dr. Rayann Heman in to see patient

## 2018-07-28 ENCOUNTER — Encounter (HOSPITAL_COMMUNITY): Payer: Self-pay | Admitting: Internal Medicine

## 2018-07-28 DIAGNOSIS — I4819 Other persistent atrial fibrillation: Secondary | ICD-10-CM | POA: Diagnosis not present

## 2018-07-28 DIAGNOSIS — I484 Atypical atrial flutter: Secondary | ICD-10-CM | POA: Diagnosis not present

## 2018-07-28 DIAGNOSIS — R001 Bradycardia, unspecified: Secondary | ICD-10-CM | POA: Diagnosis not present

## 2018-07-28 DIAGNOSIS — I5032 Chronic diastolic (congestive) heart failure: Secondary | ICD-10-CM | POA: Diagnosis not present

## 2018-07-28 LAB — BASIC METABOLIC PANEL
Anion gap: 7 (ref 5–15)
BUN: 11 mg/dL (ref 8–23)
CO2: 25 mmol/L (ref 22–32)
CREATININE: 0.89 mg/dL (ref 0.44–1.00)
Calcium: 9.9 mg/dL (ref 8.9–10.3)
Chloride: 108 mmol/L (ref 98–111)
GFR calc non Af Amer: >60 mL/min (ref >60)
Glucose, Bld: 121 mg/dL — ABNORMAL HIGH (ref 70–99)
Potassium: 5.1 mmol/L (ref 3.5–5.1)
Sodium: 140 mmol/L (ref 135–145)

## 2018-07-28 LAB — PROTIME-INR
INR: 2.99
PROTHROMBIN TIME: 30.6 s — AB (ref 11.4–15.2)

## 2018-07-28 LAB — MAGNESIUM: Magnesium: 2.1 mg/dL (ref 1.7–2.4)

## 2018-07-28 MED ORDER — PANTOPRAZOLE SODIUM 40 MG PO TBEC: 40.0000 mg | DELAYED_RELEASE_TABLET | Freq: Every day | ORAL | 0 refills | Status: AC

## 2018-07-28 NOTE — Discharge Summary (Addendum)
ELECTROPHYSIOLOGY PROCEDURE DISCHARGE SUMMARY    Patient ID: Jo Hughes,  MRN: [id number], DOB/AGE: [birth year] 78 y.o.  Admit date: 07/27/2018 Discharge date: 07/28/2018  Primary Care Physician: Sinclair Grooms, MD  Primary Cardiologist: Dr. Tamala Julian Electrophysiologist: Thompson Grayer, MD  Primary Discharge Diagnosis:  1. Persistent AFib     CHA2DS2Vasc is 4, on warfarin, monitored and managed by coumadin clinic  Secondary Discharge Diagnosis:  1. CAD 2. Chronic diastolic CHF 3. VHD h/o MV/TV repairs 4. Hypothyroid 5. Bradycardia w/PPM (MDT)   Procedures This Admission:  1.  Electrophysiology study and radiofrequency catheter ablation on 07/27/2018 by Dr Thompson Grayer.   This study demonstrated  CONCLUSIONS: 1. Sinus rhythm upon presentation.   2. Intracardiac echo reveals no evidence of pulmonary vein stenosis. 3. The right and left PVs were predominantly isolated from a prior ablation procedure.  There was very slight voltage along the anterior portion of the carina on left and right sides.   4. Ablation was performed along the anterior portion of the carina between the left superior and inferior pulmonary veins and also between the right superior and inferior pulmonary veins in order to isolate these veins.  No other ablation was required along the pulmonary veins.   5. With catheter manipulation, the patient developed atrial fibrillation.  Additional left atrial ablation was performed with a standard box lesion created along the posterior wall of the left atrium 6. Atrial fibrillation successfully cardioverted to sinus rhythm. 7. No early apparent complications.    Brief HPI: Jo Hughes is a 78 y.o. female with a history of persistent atrial fibrillation. She has failed medical therapy with Tikosyn. Risks, benefits, and alternatives to catheter ablation of atrial fibrillation were reviewed with the patient who wished to proceed.  The patient underwent cardiac CT prior  to the procedure which demonstrated LAA closure with no LAA thrombus in the remnant of the appendage.    Hospital Course:  The patient was admitted and underwent EPS/RFCA of atrial fibrillation with details as outlined above.  They were monitored on telemetry overnight which demonstrated A paced/V sensing.  R groin was without complication on the day of discharge.  The patient feels well this morning, slight pleuritic discomfort only, no SOB, no site pain, she was examined by Dr. Rayann Heman and considered to be stable for discharge.  Wound care and restrictions were reviewed with the patient.  The patient will be seen back by Roderic Palau, NP in 4 weeks and Dr Rayann Heman in 12 weeks for post ablation follow up.      Physical Exam: Vitals:   07/27/18 1517 07/27/18 2053 07/28/18 0118 07/28/18 0545  BP: (!) 107/55 114/61  103/80  Pulse: 70 79  71  Resp: 18 17  18   Temp: 98.2 F (36.8 C) 98.4 F (36.9 C)  98.2 F (36.8 C)  TempSrc: Oral Oral  Oral  SpO2: 97% 96%  94%  Weight:   58.6 kg   Height:        GEN- The patient is well appearing, alert and oriented x 3 today.   HEENT: normocephalic, atraumatic; sclera clear, conjunctiva pink; hearing intact; oropharynx clear; neck supple  Lungs- CTA b/l, normal work of breathing.  No wheezes, rales, rhonchi Heart- RRR, no murmurs, rubs or gallops  GI- soft, non-tender, non-distended  Extremities- no clubbing, cyanosis, or edema; DP/PT pulses 2+ bilaterally, R groin without hematoma/bruit/bleeding MS- no significant deformity or atrophy Skin- warm and dry, no rash or lesion Psych-  euthymic mood, full affect Neuro- strength and sensation are intact   Labs:   Lab Results  Component Value Date   WBC 7.9 06/29/2018   HGB 14.6 06/29/2018   HCT 43.1 06/29/2018   MCV 85 06/29/2018   PLT 200 06/29/2018    Recent Labs  Lab 07/28/18 0346  NA 140  K 5.1  CL 108  CO2 25  BUN 11  CREATININE 0.89  CALCIUM 9.9  GLUCOSE 121*     Discharge  Medications:  Allergies as of 07/28/2018      Reactions   Citalopram Hydrobromide Other (See Comments)   Headache and insomnia   Trazodone And Nefazodone Other (See Comments)   Weakness.      Medication List    TAKE these medications   ALPRAZolam 0.5 MG tablet Commonly known as:  XANAX Take 0.5 mg by mouth 2 (two) times daily as needed for anxiety.   amoxicillin 500 MG capsule Commonly known as:  AMOXIL Take 2,000 mg by mouth See admin instructions. Take four 4 capsules (2000 mg) by mouth one (1) hour prior to dental procedures.   cycloSPORINE 0.05 % ophthalmic emulsion Commonly known as:  RESTASIS Place 1 drop into both eyes 2 (two) times daily.   dofetilide 125 MCG capsule Commonly known as:  TIKOSYN Take 1 capsule by mouth twice daily along with a 250 mcg capsule What changed:    how much to take  how to take this  when to take this  additional instructions   dofetilide 250 MCG capsule Commonly known as:  TIKOSYN Take 1 capsule by mouth twice daily along with a 125 mcg capsule What changed:    how much to take  how to take this  when to take this  additional instructions   dorzolamide-timolol 22.3-6.8 MG/ML ophthalmic solution Commonly known as:  COSOPT Place 1 drop into both eyes 2 (two) times daily.   Fish Oil 1000 MG Caps Take 1,000 mg by mouth 2 (two) times daily.   furosemide 80 MG tablet Commonly known as:  LASIX Take 80 mg by mouth every evening.   HYDROcodone-acetaminophen 5-325 MG tablet Commonly known as:  NORCO/VICODIN Take 1 tablet by mouth every 6 (six) hours as needed for moderate pain. Notes to patient:  Only take as instructed by the original prescribing doctor.  Do not take with the Tramadol.  Confirm with your primary doctor this medicine   ICAPS Caps Take 1 capsule by mouth 2 (two) times daily.   latanoprost 0.005 % ophthalmic solution Commonly known as:  XALATAN Place 1 drop into both eyes at bedtime.   metoprolol  succinate 25 MG 24 hr tablet Commonly known as:  TOPROL-XL TAKE 2 TABLETS BY MOUTH ONCE DAILY. What changed:    how much to take  when to take this   pantoprazole 40 MG tablet Commonly known as:  PROTONIX Take 1 tablet (40 mg total) by mouth daily.   PARoxetine 20 MG tablet Commonly known as:  PAXIL Take 1 tablet (20 mg total) by mouth daily. What changed:  when to take this   potassium chloride SA 20 MEQ tablet Commonly known as:  K-DUR,KLOR-CON TAKE 1 TABLET BY MOUTH TWICE DAILY.   rosuvastatin 40 MG tablet Commonly known as:  CRESTOR Take 40 mg by mouth every evening.   SYNTHROID 75 MCG tablet Generic drug:  levothyroxine Take 75 mcg by mouth daily before breakfast.   traMADol 50 MG tablet Commonly known as:  ULTRAM Take 50 mg  by mouth every 12 (twelve) hours as needed for moderate pain. Notes to patient:  Take only as you were instructed by the original prescribing doctor, do not take with the Hydrocodone/acetaminophen.  Confirm this medicine with your primary doctor   warfarin 5 MG tablet Commonly known as:  COUMADIN Take as directed. If you are unsure how to take this medication, talk to your nurse or doctor. Original instructions:  TAKE AS DIRECTED BY COUMADIN CLINIC. What changed:  See the new instructions. Notes to patient:  No warfarin today.  Tomorrow 07/29/18 take 2.5mg  (1/2 tab), Friday 07/30/18 resume your usual dose/schedule   ZIOPTAN 0.0015 % Soln Generic drug:  Tafluprost (PF) Place 1 drop into both eyes at bedtime.   zolmitriptan 5 MG tablet Commonly known as:  ZOMIG Take 5 mg by mouth daily as needed for migraine.       Disposition:  Home  Discharge Instructions    Diet - low sodium heart healthy   Complete by:  As directed    Increase activity slowly   Complete by:  As directed      Follow-up Information    [place] [facility] Follow up.   Specialty:  Cardiology Why:  08/03/2018 @ 11:00AM, coumadin clinic Contact  information: [address], [address] [place] Follow up.   Specialty:  Cardiology Why:  08/30/2018 @ 2:30PM Contact information: [address] [id number] [place] [address] [phone]       Thompson Grayer, MD Follow up.   Specialty:  Cardiology Why:  11/01/2018 @ 10:45AM Contact information: [place] [address] [phone]           Duration of Discharge Encounter: Greater than 30 minutes including physician time.  Signed, Tommye Standard, PA-C 07/28/2018 10:14 AM   I have seen, examined the patient, and reviewed the above assessment and plan.  Changes to above are made where necessary.  On exam, RRR.  Doing well s/p ablation.  DC to home with routine wound care and follow-up.  Hold coumadin tonight, give 2.5 mg tomorrow night, then resume normal dosing.  Will need close follow-up in coumadin clinic.  Co Sign: Thompson Grayer, MD 07/28/2018 8:35 PM

## 2018-07-29 NOTE — Anesthesia Postprocedure Evaluation (Signed)
Anesthesia Post Note  Patient: Jo Hughes  Procedure(s) Performed: ATRIAL FIBRILLATION ABLATION (N/A )     Patient location during evaluation: PACU Anesthesia Type: General Level of consciousness: awake and alert Pain management: pain level controlled Vital Signs Assessment: post-procedure vital signs reviewed and stable Respiratory status: spontaneous breathing, nonlabored ventilation, respiratory function stable and patient connected to nasal cannula oxygen Cardiovascular status: blood pressure returned to baseline and stable Postop Assessment: no apparent nausea or vomiting Anesthetic complications: no    Last Vitals:  Vitals:   07/27/18 2053 07/28/18 0545  BP: 114/61 103/80  Pulse: 79 71  Resp: 17 18  Temp: 36.9 C 36.8 C  SpO2: 96% 94%    Last Pain:  Vitals:   07/28/18 0800  TempSrc:   PainSc: 0-No pain                 Rashod Gougeon

## 2018-08-03 ENCOUNTER — Ambulatory Visit (INDEPENDENT_AMBULATORY_CARE_PROVIDER_SITE_OTHER): Payer: Medicare Other

## 2018-08-03 DIAGNOSIS — I480 Paroxysmal atrial fibrillation: Secondary | ICD-10-CM

## 2018-08-03 DIAGNOSIS — Z5181 Encounter for therapeutic drug level monitoring: Secondary | ICD-10-CM

## 2018-08-03 LAB — POCT INR: INR: 2.5 (ref 2.0–3.0)

## 2018-08-03 NOTE — Patient Instructions (Signed)
Continue on same dosage 1/2 tablet daily except 1 tablet on Mondays, Wednesdays, and Fridays. Recheck in 2 weeks.  Coumadin Clinic [phone]

## 2018-08-12 ENCOUNTER — Telehealth: Payer: Self-pay | Admitting: Internal Medicine

## 2018-08-12 NOTE — Telephone Encounter (Signed)
New Message   Pt c/o of Chest Pain: STAT if CP now or developed within 24 hours  1. Are you having CP right now? Yes, been having bad pain can't sleep or rest.  2. Are you experiencing any other symptoms (ex. SOB, nausea, vomiting, sweating)? No   3. How long have you been experiencing CP? 3days   4. Is your CP continuous or coming and going? Comes and goes worse at night when she lays down   5. Have you taken Nitroglycerin? No  ?

## 2018-08-12 NOTE — Telephone Encounter (Signed)
Talked with patient - per Jo carroll np will try taking naproxen now and repeat in 12 hours to help with inflammation. Pt denies issues with swallowing or shortness of breath. Patient was very anxious over the phone. Offered appointment this afternoon but patient stated she didn't have transportation today. Pt states this pain started about 4 days after her ablation and has continued without relief.

## 2018-08-12 NOTE — Telephone Encounter (Signed)
Spoke with the patient, she stated that 4 days after her ablation she has had chest soreness that wraps around her neck, that occurs when she is laying down and it remains constant. If the patient is upright the pain goes away. He pain is 8/10 when it occurs. She has had trouble sleeping for 3 or 4 days due to sleeping in a recliner and the pain. She stated that her tramadol helps with the pain. She stated this morning the soreness has not gone away even though she is not reclined flat. Spoke with Dr. Jackalyn Lombard nurse, she advised the patient go to afib clinic to be seen. Scheduled the patient with afib clinic tomorrow, 1/31 at 10:30.

## 2018-08-13 ENCOUNTER — Encounter (HOSPITAL_COMMUNITY): Payer: Self-pay | Admitting: Nurse Practitioner

## 2018-08-13 ENCOUNTER — Ambulatory Visit (HOSPITAL_BASED_OUTPATIENT_CLINIC_OR_DEPARTMENT_OTHER)
Admission: RE | Admit: 2018-08-13 | Discharge: 2018-08-13 | Disposition: A | Discharge status: Home / Self Care | Payer: Medicare Other | Source: Ambulatory Visit | Attending: Nurse Practitioner | Admitting: Nurse Practitioner

## 2018-08-13 ENCOUNTER — Other Ambulatory Visit (HOSPITAL_COMMUNITY): Payer: Self-pay | Admitting: Nurse Practitioner

## 2018-08-13 ENCOUNTER — Ambulatory Visit (HOSPITAL_COMMUNITY)
Admission: RE | Admit: 2018-08-13 | Discharge: 2018-08-13 | Disposition: A | Discharge status: Home / Self Care | Payer: Medicare Other | Source: Ambulatory Visit | Attending: Nurse Practitioner | Admitting: Nurse Practitioner

## 2018-08-13 VITALS — BP 102/64 | HR 83 | Ht 64.0 in | Wt 127.0 lb

## 2018-08-13 DIAGNOSIS — I2510 Atherosclerotic heart disease of native coronary artery without angina pectoris: Secondary | ICD-10-CM | POA: Diagnosis not present

## 2018-08-13 DIAGNOSIS — Z87891 Personal history of nicotine dependence: Secondary | ICD-10-CM | POA: Insufficient documentation

## 2018-08-13 DIAGNOSIS — I4819 Other persistent atrial fibrillation: Secondary | ICD-10-CM

## 2018-08-13 DIAGNOSIS — Z792 Long term (current) use of antibiotics: Secondary | ICD-10-CM | POA: Diagnosis not present

## 2018-08-13 DIAGNOSIS — I480 Paroxysmal atrial fibrillation: Secondary | ICD-10-CM | POA: Insufficient documentation

## 2018-08-13 DIAGNOSIS — Q211 Atrial septal defect: Secondary | ICD-10-CM | POA: Insufficient documentation

## 2018-08-13 DIAGNOSIS — I5032 Chronic diastolic (congestive) heart failure: Secondary | ICD-10-CM | POA: Insufficient documentation

## 2018-08-13 DIAGNOSIS — H3530 Unspecified macular degeneration: Secondary | ICD-10-CM | POA: Insufficient documentation

## 2018-08-13 DIAGNOSIS — G4733 Obstructive sleep apnea (adult) (pediatric): Secondary | ICD-10-CM | POA: Diagnosis not present

## 2018-08-13 DIAGNOSIS — Z888 Allergy status to other drugs, medicaments and biological substances status: Secondary | ICD-10-CM | POA: Insufficient documentation

## 2018-08-13 DIAGNOSIS — I081 Rheumatic disorders of both mitral and tricuspid valves: Secondary | ICD-10-CM | POA: Insufficient documentation

## 2018-08-13 DIAGNOSIS — E785 Hyperlipidemia, unspecified: Secondary | ICD-10-CM | POA: Insufficient documentation

## 2018-08-13 DIAGNOSIS — I4892 Unspecified atrial flutter: Secondary | ICD-10-CM | POA: Insufficient documentation

## 2018-08-13 DIAGNOSIS — E039 Hypothyroidism, unspecified: Secondary | ICD-10-CM | POA: Insufficient documentation

## 2018-08-13 DIAGNOSIS — Z7901 Long term (current) use of anticoagulants: Secondary | ICD-10-CM | POA: Diagnosis not present

## 2018-08-13 DIAGNOSIS — Z8249 Family history of ischemic heart disease and other diseases of the circulatory system: Secondary | ICD-10-CM | POA: Diagnosis not present

## 2018-08-13 DIAGNOSIS — R0789 Other chest pain: Secondary | ICD-10-CM | POA: Diagnosis not present

## 2018-08-13 DIAGNOSIS — F419 Anxiety disorder, unspecified: Secondary | ICD-10-CM | POA: Diagnosis not present

## 2018-08-13 DIAGNOSIS — Z79899 Other long term (current) drug therapy: Secondary | ICD-10-CM | POA: Diagnosis not present

## 2018-08-13 DIAGNOSIS — M170 Bilateral primary osteoarthritis of knee: Secondary | ICD-10-CM | POA: Insufficient documentation

## 2018-08-13 DIAGNOSIS — R9431 Abnormal electrocardiogram [ECG] [EKG]: Secondary | ICD-10-CM | POA: Diagnosis not present

## 2018-08-13 DIAGNOSIS — Z8349 Family history of other endocrine, nutritional and metabolic diseases: Secondary | ICD-10-CM | POA: Insufficient documentation

## 2018-08-13 MED ORDER — COLCHICINE 0.6 MG PO TABS: 0.6000 mg | ORAL_TABLET | Freq: Two times a day (BID) | ORAL | 0 refills | Status: AC

## 2018-08-13 NOTE — Patient Instructions (Signed)
colchine 0.6mg  twice a day for the next 2 weeks.  Hold your crestor while you are taking the colchine

## 2018-08-13 NOTE — Progress Notes (Signed)
  Echocardiogram 2D Echocardiogram has been performed.  Jo Hughes 08/13/2018, 2:19 PM

## 2018-08-13 NOTE — Progress Notes (Signed)
Primary Care Physician: Lavone Orn, MD Referring Physician:Dr. Rayann Heman   Jo Hughes is a 78 y.o. female with a h/o CAD, hx of MV/TR repair 2005, at [facility]  CHF, persistent afib that is in the afib clinic for persistent anterior chest discomfort since ablation, 07/23/2018. She describes this a constant pain, 7/10, that does not vary with intensity.  It does not change with exertion or position. She denies swallowing or shortness of breath. No groin issues. She denies feeling any palpitations. Device interrogated today and was in atrial flutter, but broke  to SR during interrogation. She has been rate controlled with afib/flutter episodes. She did not notice any change in her chest discomfort with change in rhythm.  Today, she denies symptoms of palpitations, chest pain, shortness of breath, orthopnea, PND, lower extremity edema, dizziness, presyncope, syncope, or neurologic sequela. The patient is tolerating medications without difficulties and is otherwise without complaint today.   Past Medical History:  Diagnosis Date  . Anxiety   . Atrial fibrillation ([place])    persistent  . CAD (coronary artery disease)   . Chronic diastolic CHF (congestive heart failure) ([place])   . 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 Glaucoma   . H/O mitral valve repair 2005   [place].  . H/O tricuspid valve repair   . Hx of acquired endocarditis 1970; 1986   Prior to mitral valve repair   . Hyperlipidemia    cardiac cath clean coronary arties in the past.  . Hypothyroid   . Insomnia 12/11/2015  . Kidney stone 10/2013   "pass it"  . Long term (current) use of anticoagulants    No bleeding  . Macular degeneration    early , Hecker  . OSA (obstructive sleep apnea) 08/14/2015   Mild with AHI 7/hr  . Symptomatic bradycardia    s/p PPM  . Thyroid nodule    Past Surgical History:  Procedure Laterality Date  . ABDOMINAL HYSTERECTOMY  1986  .  ATRIAL FIBRILLATION ABLATION N/A 11/08/2013   Procedure: ATRIAL FIBRILLATION ABLATION;  Surgeon: Coralyn Mark, MD;  Location: [facility] CATH LAB;  Service: Cardiovascular;  Laterality: N/A;  . ATRIAL FIBRILLATION ABLATION N/A 07/27/2018   Procedure: ATRIAL FIBRILLATION ABLATION;  Surgeon: Thompson Grayer, MD;  Location: [facility] CV LAB;  Service: Cardiovascular;  Laterality: N/A;  . BACK SURGERY    . BREAST BIOPSY Left X 2   "benign"  . CARDIAC CATHETERIZATION  X 3  . CARDIOVERSION  ~ 2011; 09/18/2011   ?; Procedure: CARDIOVERSION;  Surgeon: Sinclair Grooms, MD;  Location: [place];  Service: Cardiovascular;  Laterality: N/A;  . CARDIOVERSION N/A 04/29/2013   Procedure: CARDIOVERSION;  Surgeon: Sinclair Grooms, MD;  Location: [facility];  Service: Cardiovascular;  Laterality: N/A;  . CATARACT EXTRACTION W/ INTRAOCULAR LENS  IMPLANT, BILATERAL  ~ 2011  . DILATION AND CURETTAGE OF UTERUS    . INSERT / REPLACE / REMOVE PACEMAKER  01/13/2012   MDT Adapta L implanted by Dr Rayann Heman  . KNEE CARTILAGE SURGERY Left 02/2009   scope; Dr. Berenice Primas  . LUMBAR LAMINECTOMY  1997  . MITRAL VALVE REPAIR  05/2004   [facility]  . PERMANENT PACEMAKER INSERTION N/A 01/13/2012   Procedure: PERMANENT PACEMAKER INSERTION;  Surgeon: Thompson Grayer, MD;  Location: [facility] CATH LAB;  Service: Cardiovascular;  Laterality: N/A;  . TEE WITHOUT CARDIOVERSION N/A 11/07/2013   Procedure: TRANSESOPHAGEAL ECHOCARDIOGRAM (TEE);  Surgeon: Pixie Casino, MD;  Location: [facility] ENDOSCOPY;  Service: Cardiovascular;  Laterality: N/A;  . TONSILLECTOMY  1948  . TRICUSPID VALVE SURGERY  05/2004   "repair; [facility]"    Current Outpatient Medications  Medication Sig Dispense Refill  . ALPRAZolam (XANAX) 0.5 MG tablet Take 0.5 mg by mouth 2 (two) times daily as needed for anxiety.     Marland Kitchen amoxicillin (AMOXIL) 500 MG capsule Take 2,000 mg by mouth See admin instructions. Take four 4 capsules (2000 mg) by mouth one (1) hour prior to dental  procedures.    . cycloSPORINE (RESTASIS) 0.05 % ophthalmic emulsion Place 1 drop into both eyes 2 (two) times daily.     Marland Kitchen dofetilide (TIKOSYN) 250 MCG capsule Take 1 capsule by mouth twice daily along with a 125 mcg capsule (Patient taking differently: Take 250 mcg by mouth 2 (two) times daily. ) 60 capsule 11  . dorzolamide-timolol (COSOPT) 22.3-6.8 MG/ML ophthalmic solution Place 1 drop into both eyes 2 (two) times daily.    . furosemide (LASIX) 80 MG tablet Take 80 mg by mouth every evening.     Marland Kitchen HYDROcodone-acetaminophen (NORCO/VICODIN) 5-325 MG per tablet Take 1 tablet by mouth every 6 (six) hours as needed for moderate pain.    Marland Kitchen latanoprost (XALATAN) 0.005 % ophthalmic solution Place 1 drop into both eyes at bedtime.     . metoprolol succinate (TOPROL-XL) 25 MG 24 hr tablet TAKE 2 TABLETS BY MOUTH ONCE DAILY. 60 tablet 10  . Multiple Vitamins-Minerals (ICAPS) CAPS Take 1 capsule by mouth 2 (two) times daily.     . Omega-3 Fatty Acids (FISH OIL) 1000 MG CAPS Take 1,000 mg by mouth 2 (two) times daily.     . pantoprazole (PROTONIX) 40 MG tablet Take 1 tablet (40 mg total) by mouth daily. 45 tablet 0  . PARoxetine (PAXIL) 20 MG tablet Take 1 tablet (20 mg total) by mouth daily. 90 tablet 3  . potassium chloride SA (K-DUR,KLOR-CON) 20 MEQ tablet TAKE 1 TABLET BY MOUTH TWICE DAILY. 60 tablet 11  . rosuvastatin (CRESTOR) 40 MG tablet Take 40 mg by mouth every evening.     Marland Kitchen SYNTHROID 75 MCG tablet Take 75 mcg by mouth daily before breakfast.    . traMADol (ULTRAM) 50 MG tablet Take 50 mg by mouth every 12 (twelve) hours as needed for moderate pain.     Marland Kitchen warfarin (COUMADIN) 5 MG tablet TAKE AS DIRECTED BY COUMADIN CLINIC. (Patient taking differently: Take 2.5-5 mg by mouth See admin instructions. Take as directed by Coumadin Clinic.  Take 0.5 tablet (2.5 mg) by mouth on Saturday & Tuesday; take 1 tablet (5 mg) by mouth on all other days of the week. (07/19/2018)) 30 tablet 3  . ZIOPTAN 0.0015 %  SOLN Place 1 drop into both eyes at bedtime.     Marland Kitchen zolmitriptan (ZOMIG) 5 MG tablet Take 5 mg by mouth daily as needed for migraine.     . colchicine 0.6 MG tablet Take 1 tablet (0.6 mg total) by mouth 2 (two) times daily. 28 tablet 0  . dofetilide (TIKOSYN) 125 MCG capsule Take 1 capsule by mouth twice daily along with a 250 mcg capsule (Patient taking differently: Take 125 mcg by mouth 2 (two) times daily. ) 60 capsule 11   No current facility-administered medications for this encounter.     Allergies  Allergen Reactions  . Citalopram Hydrobromide Other (See Comments)    Headache and insomnia  . Trazodone And Nefazodone  Other (See Comments)    Weakness.    Social History   Socioeconomic History  . Marital status: Legally Separated    Spouse name: Not on file  . Number of children: Not on file  . Years of education: Not on file  . Highest education level: Not on file  Occupational History  . Not on file  Social Needs  . Financial resource strain: Not on file  . Food insecurity:    Worry: Not on file    Inability: Not on file  . Transportation needs:    Medical: Not on file    Non-medical: Not on file  Tobacco Use  . Smoking status: Former Smoker    Packs/day: 1.00    Years: 24.00    Pack years: 24.00    Types: Cigarettes    Last attempt to quit: 07/15/1987    Years since quitting: 31.1  . Smokeless tobacco: Never Used  Substance and Sexual Activity  . Alcohol use: Yes    Comment: 07/27/2018 "probably 2 glasses of wine/month  . Drug use: No  . Sexual activity: Not Currently  Lifestyle  . Physical activity:    Days per week: Not on file    Minutes per session: Not on file  . Stress: Not on file  Relationships  . Social connections:    Talks on phone: Not on file    Gets together: Not on file    Attends religious service: Not on file    Active member of club or organization: Not on file    Attends meetings of clubs or organizations: Not on file    Relationship  status: Not on file  . Intimate partner violence:    Fear of current or ex partner: Not on file    Emotionally abused: Not on file    Physically abused: Not on file    Forced sexual activity: Not on file  Other Topics Concern  . Not on file  Social History Narrative   Lives in [place] with spouse.  No children.  Retired Education officer, museum.    Family History  Problem Relation Age of Onset  . Stroke Father   . Heart attack Father 70  . Heart disease Father   . Hyperlipidemia Father   . Hypertension Father   . Macular degeneration Mother   . Transient ischemic attack Mother        multiple  . Varicose Veins Mother   . Hypertension Brother     ROS- All systems are reviewed and negative except as per the HPI above  Physical Exam: Vitals:   08/13/18 1050  BP: 102/64  Pulse: 83  Weight: 57.6 kg  Height: 5\' 4"  (1.626 m)   Wt Readings from Last 3 Encounters:  08/13/18 57.6 kg  07/28/18 58.6 kg  06/14/18 58.9 kg    Labs: Lab Results  Component Value Date   NA 140 07/28/2018   K 5.1 07/28/2018   CL 108 07/28/2018   CO2 25 07/28/2018   GLUCOSE 121 (H) 07/28/2018   BUN 11 07/28/2018   CREATININE 0.89 07/28/2018   CALCIUM 9.9 07/28/2018   MG 2.1 07/28/2018   Lab Results  Component Value Date   INR 2.5 08/03/2018   No results found for: CHOL, HDL, LDLCALC, TRIG   GEN- The patient is well appearing, alert and oriented x 3 today.   Head- normocephalic, atraumatic Eyes-  Sclera clear, conjunctiva pink Ears- hearing intact Oropharynx- clear Neck- supple, no JVP Lymph- no cervical  lymphadenopathy Lungs- Clear to ausculation bilaterally, normal work of breathing Heart- Regular rate and rhythm, no murmurs, rubs or gallops, PMI not laterally displaced. Chest wall tender on palpitation GI- soft, NT, ND, + BS Extremities- no clubbing, cyanosis, or edema MS- no significant deformity or atrophy Skin- no rash or lesion Psych- euthymic mood, full affect Neuro- strength  and sensation are intact  EKG-v paced at 83 bpm  Interrogation of device showed intermittent afib/flutter with controlled v rates since ablation. She did presnt in flutter but did  break into SR during the interrogation. Labs reviewed from 07/28/18 with  K+at 5.1 creatinine at 0.89 and mag at 2.1    Assessment and Plan: 1.Paroxysmal afib/flutter Continue Tikosyn and metoprolol without change  Continue warfarin, last INR 2.5 1/21  2. Constant chest discomfort  Discussed with Dr. Rayann Heman  Suspect pericarditis Will get a limited echo today Will start colchicine 0.6 mg bid x 2 weeks, she may have to reduce to one a day if diarrhea occurs Advil one today and tomorrow with food Will bring  back on Monday for f/u To the ER this weekend if condition worsens  Butch Penny C. Jaime Grizzell, [facility] [address] [place], [place] [address] [phone]

## 2018-08-14 DIAGNOSIS — R160 Hepatomegaly, not elsewhere classified: Secondary | ICD-10-CM

## 2018-08-14 HISTORY — DX: Hepatomegaly, not elsewhere classified: R16.0

## 2018-08-16 ENCOUNTER — Ambulatory Visit (HOSPITAL_COMMUNITY)
Admission: RE | Admit: 2018-08-16 | Discharge: 2018-08-16 | Disposition: A | Discharge status: Home / Self Care | Payer: Medicare Other | Source: Ambulatory Visit | Attending: Nurse Practitioner | Admitting: Nurse Practitioner

## 2018-08-16 ENCOUNTER — Encounter (HOSPITAL_COMMUNITY): Payer: Self-pay | Admitting: Nurse Practitioner

## 2018-08-16 VITALS — BP 110/72 | HR 87 | Ht 64.0 in | Wt 124.6 lb

## 2018-08-16 DIAGNOSIS — Z79899 Other long term (current) drug therapy: Secondary | ICD-10-CM | POA: Insufficient documentation

## 2018-08-16 DIAGNOSIS — I2510 Atherosclerotic heart disease of native coronary artery without angina pectoris: Secondary | ICD-10-CM | POA: Diagnosis not present

## 2018-08-16 DIAGNOSIS — I480 Paroxysmal atrial fibrillation: Secondary | ICD-10-CM | POA: Diagnosis not present

## 2018-08-16 DIAGNOSIS — Z8249 Family history of ischemic heart disease and other diseases of the circulatory system: Secondary | ICD-10-CM | POA: Diagnosis not present

## 2018-08-16 DIAGNOSIS — Z7901 Long term (current) use of anticoagulants: Secondary | ICD-10-CM | POA: Insufficient documentation

## 2018-08-16 DIAGNOSIS — G4733 Obstructive sleep apnea (adult) (pediatric): Secondary | ICD-10-CM | POA: Insufficient documentation

## 2018-08-16 DIAGNOSIS — E785 Hyperlipidemia, unspecified: Secondary | ICD-10-CM | POA: Insufficient documentation

## 2018-08-16 DIAGNOSIS — F419 Anxiety disorder, unspecified: Secondary | ICD-10-CM | POA: Insufficient documentation

## 2018-08-16 DIAGNOSIS — Z87891 Personal history of nicotine dependence: Secondary | ICD-10-CM | POA: Diagnosis not present

## 2018-08-16 DIAGNOSIS — I5032 Chronic diastolic (congestive) heart failure: Secondary | ICD-10-CM | POA: Diagnosis not present

## 2018-08-16 DIAGNOSIS — R0789 Other chest pain: Secondary | ICD-10-CM | POA: Diagnosis not present

## 2018-08-16 DIAGNOSIS — R079 Chest pain, unspecified: Secondary | ICD-10-CM | POA: Diagnosis not present

## 2018-08-16 DIAGNOSIS — E039 Hypothyroidism, unspecified: Secondary | ICD-10-CM | POA: Diagnosis not present

## 2018-08-16 DIAGNOSIS — Z79890 Hormone replacement therapy (postmenopausal): Secondary | ICD-10-CM | POA: Diagnosis not present

## 2018-08-16 LAB — BASIC METABOLIC PANEL
Anion gap: 10 (ref 5–15)
BUN: 19 mg/dL (ref 8–23)
CO2: 23 mmol/L (ref 22–32)
Calcium: 10.6 mg/dL — ABNORMAL HIGH (ref 8.9–10.3)
Chloride: 101 mmol/L (ref 98–111)
Creatinine, Ser: 1.06 mg/dL — ABNORMAL HIGH (ref 0.44–1.00)
GFR calc Af Amer: 59 mL/min — ABNORMAL LOW (ref >60)
GFR calc non Af Amer: 51 mL/min — ABNORMAL LOW (ref >60)
Glucose, Bld: 108 mg/dL — ABNORMAL HIGH (ref 70–99)
POTASSIUM: 4.1 mmol/L (ref 3.5–5.1)
Sodium: 134 mmol/L — ABNORMAL LOW (ref 135–145)

## 2018-08-16 LAB — ECHOCARDIOGRAM COMPLETE
Height: 64 in
Weight: 2032 oz

## 2018-08-16 LAB — CBC
HCT: 40 % (ref 36.0–46.0)
Hemoglobin: 13.1 g/dL (ref 12.0–15.0)
MCH: 27.5 pg (ref 26.0–34.0)
MCHC: 32.8 g/dL (ref 30.0–36.0)
MCV: 84 fL (ref 80.0–100.0)
Platelets: 201 10*3/uL (ref 150–400)
RBC: 4.76 MIL/uL (ref 3.87–5.11)
RDW: 13.3 % (ref 11.5–15.5)
WBC: 9.3 10*3/uL (ref 4.0–10.5)
nRBC: 0 % (ref 0.0–0.2)

## 2018-08-16 NOTE — Progress Notes (Signed)
Primary Care Physician: Lavone Orn, MD Referring Physician:Dr. Rayann Heman   Jo Hughes is a 78 y.o. female with a h/o CAD, hx of MV/TR repair 2005, at [facility]  CHF, persistent afib that is in the afib clinic for persistent anterior chest discomfort since ablation, 07/23/2018. She describes this a constant pain, 7/10, that does not vary with intensity.  It does not change with exertion or position. She denies swallowing or shortness of breath. No groin issues. She denies feeling any palpitations. Device interrogated today and was in atrial flutter, but broke  to SR during interrogation. She has been rate controlled with afib/flutter episodes. She did not notice any change in her chest discomfort with change in rhythm.  F/u in afib clinic, 2/3, seeing her for ongoing constant chest discomfort since ablation. She did get the colchicine from the drugstore but did not get the Aleve. She states no difference in her chest discomfort, still at 7/10 without change in regard to  her activity. She has been taking tramadol on a regular basis, but has not helped. Her echo done Friday did not show any pericardial effusion, echo reviewed with DrRayann Heman and he did not feel that there was anything to be concerned about.  Today, she denies symptoms of palpitations, chest pain, shortness of breath, orthopnea, PND, lower extremity edema, dizziness, presyncope, syncope, or neurologic sequela. The patient is tolerating medications without difficulties and is otherwise without complaint today.   Past Medical History:  Diagnosis Date  . Anxiety   . Atrial fibrillation ([place])    persistent  . CAD (coronary artery disease)   . Chronic diastolic CHF (congestive heart failure) ([place])   . 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 Glaucoma   . H/O mitral valve repair 2005   [place].  . H/O tricuspid valve repair   . Hx of acquired endocarditis 1970; 1986   Prior  to mitral valve repair   . Hyperlipidemia    cardiac cath clean coronary arties in the past.  . Hypothyroid   . Insomnia 12/11/2015  . Kidney stone 10/2013   "pass it"  . Long term (current) use of anticoagulants    No bleeding  . Macular degeneration    early , [facility]  . OSA (obstructive sleep apnea) 08/14/2015   Mild with AHI 7/hr  . Symptomatic bradycardia    s/p PPM  . Thyroid nodule    Past Surgical History:  Procedure Laterality Date  . ABDOMINAL HYSTERECTOMY  1986  . ATRIAL FIBRILLATION ABLATION N/A 11/08/2013   Procedure: ATRIAL FIBRILLATION ABLATION;  Surgeon: Coralyn Mark, MD;  Location: [place] CATH LAB;  Service: Cardiovascular;  Laterality: N/A;  . ATRIAL FIBRILLATION ABLATION N/A 07/27/2018   Procedure: ATRIAL FIBRILLATION ABLATION;  Surgeon: Thompson Grayer, MD;  Location: [place] CV LAB;  Service: Cardiovascular;  Laterality: N/A;  . BACK SURGERY    . BREAST BIOPSY Left X 2   "benign"  . CARDIAC CATHETERIZATION  X 3  . CARDIOVERSION  ~ 2011; 09/18/2011   ?; Procedure: CARDIOVERSION;  Surgeon: Sinclair Grooms, MD;  Location: [place];  Service: Cardiovascular;  Laterality: N/A;  . CARDIOVERSION N/A 04/29/2013   Procedure: CARDIOVERSION;  Surgeon: Sinclair Grooms, MD;  Location: [facility];  Service: Cardiovascular;  Laterality: N/A;  . CATARACT EXTRACTION W/ INTRAOCULAR LENS  IMPLANT, BILATERAL  ~ 2011  . DILATION AND CURETTAGE OF UTERUS    .  INSERT / REPLACE / REMOVE PACEMAKER  01/13/2012   MDT Adapta L implanted by Dr Rayann Heman  . KNEE CARTILAGE SURGERY Left 02/2009   scope; Dr. Berenice Primas  . LUMBAR LAMINECTOMY  1997  . MITRAL VALVE REPAIR  05/2004   [facility]  . PERMANENT PACEMAKER INSERTION N/A 01/13/2012   Procedure: PERMANENT PACEMAKER INSERTION;  Surgeon: Thompson Grayer, MD;  Location: [facility] CATH LAB;  Service: Cardiovascular;  Laterality: N/A;  . TEE WITHOUT CARDIOVERSION N/A 11/07/2013   Procedure: TRANSESOPHAGEAL ECHOCARDIOGRAM (TEE);  Surgeon: Pixie Casino,  MD;  Location: [facility] ENDOSCOPY;  Service: Cardiovascular;  Laterality: N/A;  . TONSILLECTOMY  1948  . TRICUSPID VALVE SURGERY  05/2004   "repair; [facility]"    Current Outpatient Medications  Medication Sig Dispense Refill  . ALPRAZolam (XANAX) 0.5 MG tablet Take 0.5 mg by mouth 2 (two) times daily as needed for anxiety.     Marland Kitchen amoxicillin (AMOXIL) 500 MG capsule Take 2,000 mg by mouth See admin instructions. Take four 4 capsules (2000 mg) by mouth one (1) hour prior to dental procedures.    . colchicine 0.6 MG tablet Take 1 tablet (0.6 mg total) by mouth 2 (two) times daily. 28 tablet 0  . cycloSPORINE (RESTASIS) 0.05 % ophthalmic emulsion Place 1 drop into both eyes 2 (two) times daily.     Marland Kitchen dofetilide (TIKOSYN) 125 MCG capsule Take 1 capsule by mouth twice daily along with a 250 mcg capsule (Patient taking differently: Take 125 mcg by mouth 2 (two) times daily. ) 60 capsule 11  . dofetilide (TIKOSYN) 250 MCG capsule Take 1 capsule by mouth twice daily along with a 125 mcg capsule (Patient taking differently: Take 250 mcg by mouth 2 (two) times daily. ) 60 capsule 11  . dorzolamide-timolol (COSOPT) 22.3-6.8 MG/ML ophthalmic solution Place 1 drop into both eyes 2 (two) times daily.    . furosemide (LASIX) 80 MG tablet Take 80 mg by mouth every evening.     Marland Kitchen HYDROcodone-acetaminophen (NORCO/VICODIN) 5-325 MG per tablet Take 1 tablet by mouth every 6 (six) hours as needed for moderate pain.    Marland Kitchen latanoprost (XALATAN) 0.005 % ophthalmic solution Place 1 drop into both eyes at bedtime.     . metoprolol succinate (TOPROL-XL) 25 MG 24 hr tablet TAKE 2 TABLETS BY MOUTH ONCE DAILY. 60 tablet 10  . Multiple Vitamins-Minerals (ICAPS) CAPS Take 1 capsule by mouth 2 (two) times daily.     . Omega-3 Fatty Acids (FISH OIL) 1000 MG CAPS Take 1,000 mg by mouth 2 (two) times daily.     . pantoprazole (PROTONIX) 40 MG tablet Take 1 tablet (40 mg total) by mouth daily. 45 tablet 0  . PARoxetine (PAXIL) 20  MG tablet Take 1 tablet (20 mg total) by mouth daily. 90 tablet 3  . potassium chloride SA (K-DUR,KLOR-CON) 20 MEQ tablet TAKE 1 TABLET BY MOUTH TWICE DAILY. 60 tablet 11  . rosuvastatin (CRESTOR) 40 MG tablet Take 40 mg by mouth every evening.     Marland Kitchen SYNTHROID 75 MCG tablet Take 75 mcg by mouth daily before breakfast.    . traMADol (ULTRAM) 50 MG tablet Take 50 mg by mouth every 12 (twelve) hours as needed for moderate pain.     Marland Kitchen warfarin (COUMADIN) 5 MG tablet TAKE AS DIRECTED BY COUMADIN CLINIC. (Patient taking differently: Take 2.5-5 mg by mouth See admin instructions. Take as directed by Coumadin Clinic.  Take 0.5 tablet (2.5 mg) by mouth on Saturday & Tuesday; take  1 tablet (5 mg) by mouth on all other days of the week. (07/19/2018)) 30 tablet 3  . ZIOPTAN 0.0015 % SOLN Place 1 drop into both eyes at bedtime.     Marland Kitchen zolmitriptan (ZOMIG) 5 MG tablet Take 5 mg by mouth daily as needed for migraine.      No current facility-administered medications for this encounter.     Allergies  Allergen Reactions  . Citalopram Hydrobromide Other (See Comments)    Headache and insomnia  . Trazodone And Nefazodone Other (See Comments)    Weakness.    Social History   Socioeconomic History  . Marital status: Legally Separated    Spouse name: Not on file  . Number of children: Not on file  . Years of education: Not on file  . Highest education level: Not on file  Occupational History  . Not on file  Social Needs  . Financial resource strain: Not on file  . Food insecurity:    Worry: Not on file    Inability: Not on file  . Transportation needs:    Medical: Not on file    Non-medical: Not on file  Tobacco Use  . Smoking status: Former Smoker    Packs/day: 1.00    Years: 24.00    Pack years: 24.00    Types: Cigarettes    Last attempt to quit: 07/15/1987    Years since quitting: 31.1  . Smokeless tobacco: Never Used  Substance and Sexual Activity  . Alcohol use: Yes    Comment:  07/27/2018 "probably 2 glasses of wine/month  . Drug use: No  . Sexual activity: Not Currently  Lifestyle  . Physical activity:    Days per week: Not on file    Minutes per session: Not on file  . Stress: Not on file  Relationships  . Social connections:    Talks on phone: Not on file    Gets together: Not on file    Attends religious service: Not on file    Active member of club or organization: Not on file    Attends meetings of clubs or organizations: Not on file    Relationship status: Not on file  . Intimate partner violence:    Fear of current or ex partner: Not on file    Emotionally abused: Not on file    Physically abused: Not on file    Forced sexual activity: Not on file  Other Topics Concern  . Not on file  Social History Narrative   Lives in [place] with spouse.  No children.  Retired Education officer, museum.    Family History  Problem Relation Age of Onset  . Stroke Father   . Heart attack Father 64  . Heart disease Father   . Hyperlipidemia Father   . Hypertension Father   . Macular degeneration Mother   . Transient ischemic attack Mother        multiple  . Varicose Veins Mother   . Hypertension Brother     ROS- All systems are reviewed and negative except as per the HPI above  Physical Exam: Vitals:   08/16/18 1412  BP: 110/72  Pulse: 87  Weight: 56.5 kg  Height: 5\' 4"  (1.626 m)   Wt Readings from Last 3 Encounters:  08/16/18 56.5 kg  08/13/18 57.6 kg  07/28/18 58.6 kg    Labs: Lab Results  Component Value Date   NA 140 07/28/2018   K 5.1 07/28/2018   CL 108 07/28/2018  CO2 25 07/28/2018   GLUCOSE 121 (H) 07/28/2018   BUN 11 07/28/2018   CREATININE 0.89 07/28/2018   CALCIUM 9.9 07/28/2018   MG 2.1 07/28/2018   Lab Results  Component Value Date   INR 2.5 08/03/2018   No results found for: CHOL, HDL, LDLCALC, TRIG   GEN- The patient is well appearing, alert and oriented x 3 today.   Head- normocephalic, atraumatic Eyes-  Sclera  clear, conjunctiva pink Ears- hearing intact Oropharynx- clear Neck- supple, no JVP Lymph- no cervical lymphadenopathy Lungs- Clear to ausculation bilaterally, normal work of breathing Heart- Regular rate and rhythm, no murmurs, rubs or gallops, PMI not laterally displaced. Chest wall tender on palpitation GI- soft, NT, ND, + BS Extremities- no clubbing, cyanosis, or edema MS- no significant deformity or atrophy Skin- no rash or lesion Psych- euthymic mood, full affect Neuro- strength and sensation are intact  EKG-v paced at 83 bpm  Interrogation of device showed intermittent afib/flutter with controlled v rates since ablation. She did presnt in flutter but did  break into SR during the interrogation. Labs reviewed from 07/28/18 with  K+at 5.1 creatinine at 0.89 and mag at 2.1    Assessment and Plan: 1.Paroxysmal afib/flutter Continue Tikosyn and metoprolol without change  Continue warfarin, last INR 2.5 1/21  2. Constant chest discomfort  Echo stable, no pericardial effusion CBC/BMET Started colchicine 0.6 mg bid but did not get any Aleve at the drugstore She will get today and take with food until I see her back Thursday Cut back on tramadol  See back on Thursday  Lorisa Scheid C. Glenda Spelman, [facility] [address] [place], [place] [address] [phone]

## 2018-08-16 NOTE — Patient Instructions (Addendum)
Please take aleve twice a day for the next 3 days

## 2018-08-17 ENCOUNTER — Ambulatory Visit (INDEPENDENT_AMBULATORY_CARE_PROVIDER_SITE_OTHER): Payer: Medicare Other | Admitting: *Deleted

## 2018-08-17 DIAGNOSIS — Z5181 Encounter for therapeutic drug level monitoring: Secondary | ICD-10-CM | POA: Diagnosis not present

## 2018-08-17 DIAGNOSIS — I480 Paroxysmal atrial fibrillation: Secondary | ICD-10-CM | POA: Diagnosis not present

## 2018-08-17 LAB — PROTIME-INR
INR: 7.8 (ref 0.8–1.2)
Prothrombin Time: 73.4 s — ABNORMAL HIGH (ref 9.1–12.0)

## 2018-08-17 LAB — POCT INR: INR: 6.8 — AB (ref 2.0–3.0)

## 2018-08-17 NOTE — Patient Instructions (Signed)
Description   Do not take any Coumadin until we call you with results. Report to ER with bleeding, falls, or accidents. 2/4 @ 410pm called pt with results and instructed pt to hold Coumadin dose 2/5, 2/6, 2/7, and 2/8 then resume taking normal dose: 1/2 tablet daily except 1 tablet on Mondays, Wednesdays, and Fridays. Recheck in 1 week.  Coumadin Clinic [phone]

## 2018-08-18 ENCOUNTER — Other Ambulatory Visit: Payer: Self-pay | Admitting: Interventional Cardiology

## 2018-08-19 ENCOUNTER — Ambulatory Visit (HOSPITAL_COMMUNITY): Payer: Medicare Other | Admitting: Nurse Practitioner

## 2018-08-20 ENCOUNTER — Ambulatory Visit (HOSPITAL_COMMUNITY): Payer: Medicare Other | Admitting: Nurse Practitioner

## 2018-08-20 ENCOUNTER — Telehealth (HOSPITAL_COMMUNITY): Payer: Self-pay | Admitting: *Deleted

## 2018-08-20 NOTE — Telephone Encounter (Signed)
Patient called in this morning stating she could not come in for her follow up appointment today because she was more short of breath and "bleeding" patient seemed extremely anxious on the phone. Questioned patient on where she was bleeding stated her INR was over 7 earlier in the week so shes sure shes bleeding somewhere but has no noticed blood anywhere. Instructed patient if she is feeling more short of breath and couldn't come for appointment today she should proceed to ER to be further evaluated. Pt refused stating her friend was coming to see her and would be fine and moved her appointment to Monday. Instructed pt if her shortness of breath is worse the ER would be the quickest form of assessment and treatment.

## 2018-08-23 ENCOUNTER — Emergency Department (HOSPITAL_COMMUNITY): Payer: Medicare Other

## 2018-08-23 ENCOUNTER — Inpatient Hospital Stay (HOSPITAL_COMMUNITY)
Admission: EM | Admit: 2018-08-23 | Discharge: 2018-08-27 | DRG: 436 | Disposition: A | Discharge status: Skilled Nursing Facility | Payer: Medicare Other | Attending: Internal Medicine | Admitting: Internal Medicine

## 2018-08-23 ENCOUNTER — Encounter (HOSPITAL_COMMUNITY): Payer: Self-pay | Admitting: *Deleted

## 2018-08-23 ENCOUNTER — Other Ambulatory Visit: Payer: Self-pay

## 2018-08-23 ENCOUNTER — Ambulatory Visit (HOSPITAL_COMMUNITY): Payer: Medicare Other | Admitting: Nurse Practitioner

## 2018-08-23 DIAGNOSIS — H3530 Unspecified macular degeneration: Secondary | ICD-10-CM | POA: Diagnosis present

## 2018-08-23 DIAGNOSIS — I495 Sick sinus syndrome: Secondary | ICD-10-CM | POA: Diagnosis present

## 2018-08-23 DIAGNOSIS — E785 Hyperlipidemia, unspecified: Secondary | ICD-10-CM | POA: Diagnosis present

## 2018-08-23 DIAGNOSIS — Z950 Presence of cardiac pacemaker: Secondary | ICD-10-CM

## 2018-08-23 DIAGNOSIS — Z8349 Family history of other endocrine, nutritional and metabolic diseases: Secondary | ICD-10-CM

## 2018-08-23 DIAGNOSIS — I4819 Other persistent atrial fibrillation: Secondary | ICD-10-CM | POA: Diagnosis not present

## 2018-08-23 DIAGNOSIS — Z9889 Other specified postprocedural states: Secondary | ICD-10-CM | POA: Diagnosis not present

## 2018-08-23 DIAGNOSIS — I484 Atypical atrial flutter: Secondary | ICD-10-CM | POA: Diagnosis present

## 2018-08-23 DIAGNOSIS — E039 Hypothyroidism, unspecified: Secondary | ICD-10-CM | POA: Diagnosis present

## 2018-08-23 DIAGNOSIS — C787 Secondary malignant neoplasm of liver and intrahepatic bile duct: Secondary | ICD-10-CM | POA: Diagnosis present

## 2018-08-23 DIAGNOSIS — F419 Anxiety disorder, unspecified: Secondary | ICD-10-CM | POA: Diagnosis present

## 2018-08-23 DIAGNOSIS — G4700 Insomnia, unspecified: Secondary | ICD-10-CM | POA: Diagnosis present

## 2018-08-23 DIAGNOSIS — G893 Neoplasm related pain (acute) (chronic): Secondary | ICD-10-CM | POA: Diagnosis present

## 2018-08-23 DIAGNOSIS — Z79890 Hormone replacement therapy (postmenopausal): Secondary | ICD-10-CM

## 2018-08-23 DIAGNOSIS — K219 Gastro-esophageal reflux disease without esophagitis: Secondary | ICD-10-CM | POA: Diagnosis present

## 2018-08-23 DIAGNOSIS — C799 Secondary malignant neoplasm of unspecified site: Secondary | ICD-10-CM

## 2018-08-23 DIAGNOSIS — R160 Hepatomegaly, not elsewhere classified: Secondary | ICD-10-CM

## 2018-08-23 DIAGNOSIS — Z888 Allergy status to other drugs, medicaments and biological substances status: Secondary | ICD-10-CM

## 2018-08-23 DIAGNOSIS — H409 Unspecified glaucoma: Secondary | ICD-10-CM | POA: Diagnosis present

## 2018-08-23 DIAGNOSIS — C7951 Secondary malignant neoplasm of bone: Secondary | ICD-10-CM | POA: Diagnosis present

## 2018-08-23 DIAGNOSIS — M899 Disorder of bone, unspecified: Secondary | ICD-10-CM | POA: Diagnosis not present

## 2018-08-23 DIAGNOSIS — Z9842 Cataract extraction status, left eye: Secondary | ICD-10-CM | POA: Diagnosis not present

## 2018-08-23 DIAGNOSIS — M1710 Unilateral primary osteoarthritis, unspecified knee: Secondary | ICD-10-CM | POA: Diagnosis present

## 2018-08-23 DIAGNOSIS — Z961 Presence of intraocular lens: Secondary | ICD-10-CM | POA: Diagnosis present

## 2018-08-23 DIAGNOSIS — R7989 Other specified abnormal findings of blood chemistry: Secondary | ICD-10-CM | POA: Diagnosis present

## 2018-08-23 DIAGNOSIS — R52 Pain, unspecified: Secondary | ICD-10-CM | POA: Diagnosis not present

## 2018-08-23 DIAGNOSIS — Z87442 Personal history of urinary calculi: Secondary | ICD-10-CM | POA: Diagnosis not present

## 2018-08-23 DIAGNOSIS — R945 Abnormal results of liver function studies: Secondary | ICD-10-CM | POA: Diagnosis not present

## 2018-08-23 DIAGNOSIS — Z7901 Long term (current) use of anticoagulants: Secondary | ICD-10-CM

## 2018-08-23 DIAGNOSIS — E440 Moderate protein-calorie malnutrition: Secondary | ICD-10-CM | POA: Diagnosis present

## 2018-08-23 DIAGNOSIS — I2510 Atherosclerotic heart disease of native coronary artery without angina pectoris: Secondary | ICD-10-CM | POA: Diagnosis present

## 2018-08-23 DIAGNOSIS — Z9841 Cataract extraction status, right eye: Secondary | ICD-10-CM

## 2018-08-23 DIAGNOSIS — Z87891 Personal history of nicotine dependence: Secondary | ICD-10-CM

## 2018-08-23 DIAGNOSIS — Z79899 Other long term (current) drug therapy: Secondary | ICD-10-CM

## 2018-08-23 DIAGNOSIS — I5032 Chronic diastolic (congestive) heart failure: Secondary | ICD-10-CM | POA: Diagnosis not present

## 2018-08-23 DIAGNOSIS — Z6820 Body mass index (BMI) 20.0-20.9, adult: Secondary | ICD-10-CM

## 2018-08-23 DIAGNOSIS — C220 Liver cell carcinoma: Secondary | ICD-10-CM | POA: Diagnosis present

## 2018-08-23 DIAGNOSIS — G4733 Obstructive sleep apnea (adult) (pediatric): Secondary | ICD-10-CM | POA: Diagnosis present

## 2018-08-23 DIAGNOSIS — C801 Malignant (primary) neoplasm, unspecified: Secondary | ICD-10-CM

## 2018-08-23 HISTORY — DX: Hepatomegaly, not elsewhere classified: R16.0

## 2018-08-23 LAB — URINALYSIS, ROUTINE W REFLEX MICROSCOPIC
Bacteria, UA: NONE SEEN
Bilirubin Urine: NEGATIVE
GLUCOSE, UA: NEGATIVE mg/dL
Ketones, ur: NEGATIVE mg/dL
Leukocytes, UA: NEGATIVE
Nitrite: NEGATIVE
Protein, ur: 30 mg/dL — AB
Specific Gravity, Urine: >1.046 — ABNORMAL HIGH (ref 1.005–1.030)
pH: 5 (ref 5.0–8.0)

## 2018-08-23 LAB — CBC
HCT: 45.6 % (ref 36.0–46.0)
Hemoglobin: 15.1 g/dL — ABNORMAL HIGH (ref 12.0–15.0)
MCH: 27.3 pg (ref 26.0–34.0)
MCHC: 33.1 g/dL (ref 30.0–36.0)
MCV: 82.5 fL (ref 80.0–100.0)
Platelets: 193 10*3/uL (ref 150–400)
RBC: 5.53 MIL/uL — ABNORMAL HIGH (ref 3.87–5.11)
RDW: 13.8 % (ref 11.5–15.5)
WBC: 9.1 10*3/uL (ref 4.0–10.5)
nRBC: 0 % (ref 0.0–0.2)

## 2018-08-23 LAB — BASIC METABOLIC PANEL
Anion gap: 16 — ABNORMAL HIGH (ref 5–15)
BUN: 21 mg/dL (ref 8–23)
CHLORIDE: 99 mmol/L (ref 98–111)
CO2: 23 mmol/L (ref 22–32)
Calcium: 11.9 mg/dL — ABNORMAL HIGH (ref 8.9–10.3)
Creatinine, Ser: 1.12 mg/dL — ABNORMAL HIGH (ref 0.44–1.00)
GFR calc Af Amer: 55 mL/min — ABNORMAL LOW (ref >60)
GFR calc non Af Amer: 48 mL/min — ABNORMAL LOW (ref >60)
Glucose, Bld: 146 mg/dL — ABNORMAL HIGH (ref 70–99)
POTASSIUM: 3.8 mmol/L (ref 3.5–5.1)
Sodium: 138 mmol/L (ref 135–145)

## 2018-08-23 LAB — HEPATIC FUNCTION PANEL
ALBUMIN: 3.5 g/dL (ref 3.5–5.0)
ALT: 88 U/L — ABNORMAL HIGH (ref 0–44)
AST: 103 U/L — ABNORMAL HIGH (ref 15–41)
Alkaline Phosphatase: 388 U/L — ABNORMAL HIGH (ref 38–126)
BILIRUBIN TOTAL: 1 mg/dL (ref 0.3–1.2)
Bilirubin, Direct: 0.3 mg/dL — ABNORMAL HIGH (ref 0.0–0.2)
Indirect Bilirubin: 0.7 mg/dL (ref 0.3–0.9)
TOTAL PROTEIN: 7.6 g/dL (ref 6.5–8.1)

## 2018-08-23 LAB — LIPASE, BLOOD: Lipase: 40 U/L (ref 11–51)

## 2018-08-23 LAB — PROTIME-INR
INR: 2.58
Prothrombin Time: 27.3 seconds — ABNORMAL HIGH (ref 11.4–15.2)

## 2018-08-23 LAB — I-STAT TROPONIN, ED: Troponin i, poc: 0.01 ng/mL (ref 0.00–0.08)

## 2018-08-23 MED ORDER — METOPROLOL SUCCINATE ER 50 MG PO TB24
50.0000 mg | ORAL_TABLET | Freq: Every day | ORAL | Status: DC
  Administered 2018-08-23 – 2018-08-27 (×5): 50 mg via ORAL
  Filled 2018-08-23 (×5): qty 1

## 2018-08-23 MED ORDER — IOHEXOL 300 MG/ML  SOLN
100.0000 mL | Freq: Once | INTRAMUSCULAR | Status: AC | PRN
  Administered 2018-08-23: 100 mL via INTRAVENOUS

## 2018-08-23 MED ORDER — ONDANSETRON HCL 4 MG PO TABS: 4.0000 mg | ORAL_TABLET | Freq: Four times a day (QID) | ORAL | Status: DC | PRN

## 2018-08-23 MED ORDER — POTASSIUM CHLORIDE CRYS ER 20 MEQ PO TBCR
20.0000 meq | EXTENDED_RELEASE_TABLET | Freq: Two times a day (BID) | ORAL | Status: DC
  Administered 2018-08-23 – 2018-08-24 (×3): 20 meq via ORAL
  Filled 2018-08-23 (×3): qty 1

## 2018-08-23 MED ORDER — SODIUM CHLORIDE 0.9 % IV BOLUS
500.0000 mL | Freq: Once | INTRAVENOUS | Status: AC
  Administered 2018-08-23: 500 mL via INTRAVENOUS

## 2018-08-23 MED ORDER — MORPHINE SULFATE (PF) 2 MG/ML IV SOLN
2.0000 mg | INTRAVENOUS | Status: DC | PRN
  Administered 2018-08-24 – 2018-08-26 (×4): 2 mg via INTRAVENOUS
  Filled 2018-08-23 (×4): qty 1

## 2018-08-23 MED ORDER — DOFETILIDE 250 MCG PO CAPS: 250.0000 ug | ORAL_CAPSULE | Freq: Two times a day (BID) | ORAL | Status: DC

## 2018-08-23 MED ORDER — HYDROCODONE-ACETAMINOPHEN 5-325 MG PO TABS
1.0000 | ORAL_TABLET | Freq: Four times a day (QID) | ORAL | Status: DC | PRN
  Administered 2018-08-23 – 2018-08-27 (×4): 1 via ORAL
  Filled 2018-08-23 (×4): qty 1

## 2018-08-23 MED ORDER — ONDANSETRON HCL 4 MG/2ML IJ SOLN: 4.0000 mg | Freq: Four times a day (QID) | INTRAMUSCULAR | Status: DC | PRN

## 2018-08-23 MED ORDER — FUROSEMIDE 80 MG PO TABS
80.0000 mg | ORAL_TABLET | Freq: Every evening | ORAL | Status: DC
  Administered 2018-08-24 – 2018-08-25 (×2): 80 mg via ORAL
  Filled 2018-08-23 (×3): qty 1

## 2018-08-23 MED ORDER — MORPHINE SULFATE (PF) 2 MG/ML IV SOLN
2.0000 mg | Freq: Once | INTRAVENOUS | Status: AC
  Administered 2018-08-23: 2 mg via INTRAVENOUS
  Filled 2018-08-23: qty 1

## 2018-08-23 MED ORDER — ACETAMINOPHEN 325 MG PO TABS
650.0000 mg | ORAL_TABLET | Freq: Four times a day (QID) | ORAL | Status: DC | PRN
  Filled 2018-08-23: qty 2

## 2018-08-23 MED ORDER — PAROXETINE HCL 20 MG PO TABS
20.0000 mg | ORAL_TABLET | Freq: Every day | ORAL | Status: DC
  Administered 2018-08-23 – 2018-08-27 (×5): 20 mg via ORAL
  Filled 2018-08-23 (×5): qty 1

## 2018-08-23 MED ORDER — LATANOPROST 0.005 % OP SOLN
1.0000 [drp] | Freq: Every day | OPHTHALMIC | Status: DC
  Administered 2018-08-23 – 2018-08-26 (×4): 1 [drp] via OPHTHALMIC
  Filled 2018-08-23: qty 2.5

## 2018-08-23 MED ORDER — TRAMADOL HCL 50 MG PO TABS
50.0000 mg | ORAL_TABLET | Freq: Two times a day (BID) | ORAL | Status: DC | PRN
  Administered 2018-08-24 – 2018-08-26 (×2): 50 mg via ORAL
  Filled 2018-08-23 (×2): qty 1

## 2018-08-23 MED ORDER — PANTOPRAZOLE SODIUM 40 MG PO TBEC
40.0000 mg | DELAYED_RELEASE_TABLET | Freq: Every day | ORAL | Status: DC
  Administered 2018-08-23 – 2018-08-27 (×5): 40 mg via ORAL
  Filled 2018-08-23 (×5): qty 1

## 2018-08-23 MED ORDER — ROSUVASTATIN CALCIUM 20 MG PO TABS
40.0000 mg | ORAL_TABLET | Freq: Every evening | ORAL | Status: DC
  Administered 2018-08-23 – 2018-08-26 (×4): 40 mg via ORAL
  Filled 2018-08-23 (×4): qty 2

## 2018-08-23 MED ORDER — DOFETILIDE 125 MCG PO CAPS: 125.0000 ug | ORAL_CAPSULE | Freq: Two times a day (BID) | ORAL | Status: DC

## 2018-08-23 MED ORDER — CYCLOSPORINE 0.05 % OP EMUL
1.0000 [drp] | Freq: Two times a day (BID) | OPHTHALMIC | Status: DC
  Administered 2018-08-23 – 2018-08-27 (×8): 1 [drp] via OPHTHALMIC
  Filled 2018-08-23 (×8): qty 30

## 2018-08-23 MED ORDER — BISACODYL 5 MG PO TBEC: 5.0000 mg | DELAYED_RELEASE_TABLET | Freq: Every day | ORAL | Status: DC | PRN

## 2018-08-23 MED ORDER — LEVOTHYROXINE SODIUM 75 MCG PO TABS
75.0000 ug | ORAL_TABLET | Freq: Every day | ORAL | Status: DC
  Administered 2018-08-24 – 2018-08-27 (×4): 75 ug via ORAL
  Filled 2018-08-23 (×5): qty 1

## 2018-08-23 MED ORDER — ALPRAZOLAM 0.5 MG PO TABS
0.5000 mg | ORAL_TABLET | Freq: Two times a day (BID) | ORAL | Status: DC | PRN
  Administered 2018-08-26 – 2018-08-27 (×2): 0.5 mg via ORAL
  Filled 2018-08-23 (×2): qty 1

## 2018-08-23 MED ORDER — DOFETILIDE 250 MCG PO CAPS
375.0000 ug | ORAL_CAPSULE | Freq: Two times a day (BID) | ORAL | Status: DC
  Administered 2018-08-23 – 2018-08-27 (×8): 375 ug via ORAL
  Filled 2018-08-23 (×11): qty 1

## 2018-08-23 MED ORDER — ACETAMINOPHEN 650 MG RE SUPP: 650.0000 mg | Freq: Four times a day (QID) | RECTAL | Status: DC | PRN

## 2018-08-23 MED ORDER — ICAPS PO CAPS: 1.0000 | ORAL_CAPSULE | Freq: Two times a day (BID) | ORAL | Status: DC

## 2018-08-23 MED ORDER — DORZOLAMIDE HCL-TIMOLOL MAL 2-0.5 % OP SOLN
1.0000 [drp] | Freq: Two times a day (BID) | OPHTHALMIC | Status: DC
  Administered 2018-08-23 – 2018-08-27 (×8): 1 [drp] via OPHTHALMIC
  Filled 2018-08-23: qty 10

## 2018-08-23 MED ORDER — PROSIGHT PO TABS
1.0000 | ORAL_TABLET | Freq: Two times a day (BID) | ORAL | Status: DC
  Administered 2018-08-23 – 2018-08-27 (×8): 1 via ORAL
  Filled 2018-08-23 (×8): qty 1

## 2018-08-23 NOTE — ED Notes (Signed)
Patient transported to X-ray 

## 2018-08-23 NOTE — ED Triage Notes (Signed)
Patient c/o bilateral lower back pain with urinary freq since she had her ablation on 1/14, c/o chest pain states she has been going on since the ablation as well, no change today just still hurting.

## 2018-08-23 NOTE — ED Notes (Signed)
Family at bedside. 

## 2018-08-23 NOTE — ED Provider Notes (Signed)
[facility] Provider Note   CSN: [id number] Arrival date & time: 08/23/18  2751     History   Chief Complaint Chief Complaint  Patient presents with  . Chest Pain    HPI Jo [facility] is a 78 y.o. female with past medical history of A. fib status post ablation 1/14, CHF, CAD, status post mitral and tricuspid valve repair, chronically anticoagulated with warfarin, who presents today for evaluation of bilateral lower back pain, left worse than right and urinary frequency since her ablation.  She reports generally feeling unwell since then and says that this is worsening.  Her back pain is on both sides from her hips to her inferior margin of the scapula bilaterally, left worse than right.  She says for her ablation they accessed her left groin.  She denies any left leg pain or bilateral leg swelling.  She also reports abdominal pain that has worsened over the past few days, her last bowel movement was yesterday which was normal for her.  She reports occasional nausea with no vomiting.  Denies any diarrhea.  She denies dysuria.  Does report increased frequency and urgency.      Since the ablation she has had constant 7 out of 10 chest pain.  She has been seen for this by cardiology with echo without acute abnormalities, and cardiology has initiated treatment for presumed pericarditis with colchicine and Aleve.  She has been supratherapeutic on her Coumadin, with an INR of 6.8 on 08/17/18.  She denies any fevers.  No coughing.  She reports that she has generally been feeling weak.  She has had difficulties with her pain and weakness getting up to the bathroom and moving around her house.  HPI  Past Medical History:  Diagnosis Date  . Anxiety   . Atrial fibrillation ([place])    persistent  . CAD (coronary artery disease)   . Chronic diastolic CHF (congestive heart failure) ([place])   . 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  Glaucoma   . H/O mitral valve repair 2005   [facility].  . H/O tricuspid valve repair   . Hx of acquired endocarditis 1970; 1986   Prior to mitral valve repair   . Hyperlipidemia    cardiac cath clean coronary arties in the past.  . Hypothyroid   . Insomnia 12/11/2015  . Kidney stone 10/2013   "pass it"  . Long term (current) use of anticoagulants    No bleeding  . Macular degeneration    early , [place]  . OSA (obstructive sleep apnea) 08/14/2015   Mild with AHI 7/hr  . Symptomatic bradycardia    s/p PPM  . Thyroid nodule     Patient Active Problem List   Diagnosis Date Noted  . Persistent atrial fibrillation 07/27/2018  . Encounter for therapeutic drug monitoring 04/07/2017  . Insomnia 12/11/2015  . OSA (obstructive sleep apnea) 08/14/2015  . Snoring 05/03/2015  . On dofetilide therapy 10/27/2014  . Hyperlipidemia   . Hypothyroid   . Macular degeneration   . Status post mitral valve repair 05/17/2013    Class: Chronic  . Chronic diastolic heart failure ([place]) 05/17/2013    Class: Diagnosis of  . 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 Bradycardia 12/18/2011  . Atypical atrial flutter ([place]) 08/21/2011  . Atrial fibrillation ([place]) 08/21/2011    Past Surgical History:  Procedure Laterality Date  . ABDOMINAL HYSTERECTOMY  1986  . ATRIAL  FIBRILLATION ABLATION N/A 11/08/2013   Procedure: ATRIAL FIBRILLATION ABLATION;  Surgeon: Coralyn Mark, MD;  Location: [facility] CATH LAB;  Service: Cardiovascular;  Laterality: N/A;  . ATRIAL FIBRILLATION ABLATION N/A 07/27/2018   Procedure: ATRIAL FIBRILLATION ABLATION;  Surgeon: Thompson Grayer, MD;  Location: [facility] CV LAB;  Service: Cardiovascular;  Laterality: N/A;  . BACK SURGERY    . BREAST BIOPSY Left X 2   "benign"  . CARDIAC CATHETERIZATION  X 3  . CARDIOVERSION  ~ 2011; 09/18/2011   ?; Procedure: CARDIOVERSION;  Surgeon: Sinclair Grooms, MD;  Location: [facility];  Service: Cardiovascular;  Laterality: N/A;  . CARDIOVERSION  N/A 04/29/2013   Procedure: CARDIOVERSION;  Surgeon: Sinclair Grooms, MD;  Location: [facility];  Service: Cardiovascular;  Laterality: N/A;  . CATARACT EXTRACTION W/ INTRAOCULAR LENS  IMPLANT, BILATERAL  ~ 2011  . DILATION AND CURETTAGE OF UTERUS    . INSERT / REPLACE / REMOVE PACEMAKER  01/13/2012   MDT Adapta L implanted by Dr Rayann Heman  . KNEE CARTILAGE SURGERY Left 02/2009   scope; Dr. Berenice Primas  . LUMBAR LAMINECTOMY  1997  . MITRAL VALVE REPAIR  05/2004   [facility]  . PERMANENT PACEMAKER INSERTION N/A 01/13/2012   Procedure: PERMANENT PACEMAKER INSERTION;  Surgeon: Thompson Grayer, MD;  Location: [facility] CATH LAB;  Service: Cardiovascular;  Laterality: N/A;  . TEE WITHOUT CARDIOVERSION N/A 11/07/2013   Procedure: TRANSESOPHAGEAL ECHOCARDIOGRAM (TEE);  Surgeon: Pixie Casino, MD;  Location: [facility] ENDOSCOPY;  Service: Cardiovascular;  Laterality: N/A;  . TONSILLECTOMY  1948  . TRICUSPID VALVE SURGERY  05/2004   "repair; [facility]"     OB History   No obstetric history on file.      Home Medications    Prior to Admission medications   Medication Sig Start Date End Date Taking? Authorizing Provider  ALPRAZolam Duanne Moron) 0.5 MG tablet Take 0.5 mg by mouth 2 (two) times daily as needed for anxiety.     [provider]  amoxicillin (AMOXIL) 500 MG capsule Take 2,000 mg by mouth See admin instructions. Take four 4 capsules (2000 mg) by mouth one (1) hour prior to dental procedures. 08/22/14   [provider]  colchicine 0.6 MG tablet Take 1 tablet (0.6 mg total) by mouth 2 (two) times daily. 08/13/18 08/13/19  Sherran Needs, NP  cycloSPORINE (RESTASIS) 0.05 % ophthalmic emulsion Place 1 drop into both eyes 2 (two) times daily.     [provider]  dofetilide (TIKOSYN) 125 MCG capsule Take 1 capsule by mouth twice daily along with a 250 mcg capsule Patient taking differently: Take 125 mcg by mouth 2 (two) times daily.  11/11/17   Allred, Jeneen Rinks, MD  dofetilide  (TIKOSYN) 250 MCG capsule Take 1 capsule by mouth twice daily along with a 125 mcg capsule Patient taking differently: Take 250 mcg by mouth 2 (two) times daily.  11/11/17   Allred, Jeneen Rinks, MD  dorzolamide-timolol (COSOPT) 22.3-6.8 MG/ML ophthalmic solution Place 1 drop into both eyes 2 (two) times daily.    [provider]  furosemide (LASIX) 80 MG tablet Take 80 mg by mouth every evening.  10/29/14   [provider]  HYDROcodone-acetaminophen (NORCO/VICODIN) 5-325 MG per tablet Take 1 tablet by mouth every 6 (six) hours as needed for moderate pain.    [provider]  latanoprost (XALATAN) 0.005 % ophthalmic solution Place 1 drop into both eyes at bedtime.     [provider]  metoprolol succinate (  TOPROL-XL) 25 MG 24 hr tablet TAKE 2 TABLETS BY MOUTH ONCE DAILY. 08/18/18   Belva Crome, MD  Multiple Vitamins-Minerals (ICAPS) CAPS Take 1 capsule by mouth 2 (two) times daily.     [provider]  Omega-3 Fatty Acids (FISH OIL) 1000 MG CAPS Take 1,000 mg by mouth 2 (two) times daily.     [provider]  pantoprazole (PROTONIX) 40 MG tablet Take 1 tablet (40 mg total) by mouth daily. 07/28/18 09/11/18  Baldwin Jamaica, PA-C  PARoxetine (PAXIL) 20 MG tablet Take 1 tablet (20 mg total) by mouth daily. 06/14/15   Allred, Jeneen Rinks, MD  potassium chloride SA (K-DUR,KLOR-CON) 20 MEQ tablet TAKE 1 TABLET BY MOUTH TWICE DAILY. 09/04/17   Allred, Jeneen Rinks, MD  rosuvastatin (CRESTOR) 40 MG tablet Take 40 mg by mouth every evening.     [provider]  SYNTHROID 75 MCG tablet Take 75 mcg by mouth daily before breakfast. 05/17/18   [provider]  traMADol (ULTRAM) 50 MG tablet Take 50 mg by mouth every 12 (twelve) hours as needed for moderate pain.     [provider]  warfarin (COUMADIN) 5 MG tablet TAKE AS DIRECTED BY COUMADIN CLINIC. Patient taking differently: Take 2.5-5 mg by mouth See admin instructions. Take as directed by Coumadin  Clinic.  Take 0.5 tablet (2.5 mg) by mouth on Saturday & Tuesday; take 1 tablet (5 mg) by mouth on all other days of the week. (07/19/2018) 05/10/18   Belva Crome, MD  ZIOPTAN 0.0015 % SOLN Place 1 drop into both eyes at bedtime.  03/28/13   [provider]  zolmitriptan (ZOMIG) 5 MG tablet Take 5 mg by mouth daily as needed for migraine.     [provider]    Family History Family History  Problem Relation Age of Onset  . Stroke Father   . Heart attack Father 16  . Heart disease Father   . Hyperlipidemia Father   . Hypertension Father   . Macular degeneration Mother   . Transient ischemic attack Mother        multiple  . Varicose Veins Mother   . Hypertension Brother     Social History Social History   Tobacco Use  . Smoking status: Former Smoker    Packs/day: 1.00    Years: 24.00    Pack years: 24.00    Types: Cigarettes    Last attempt to quit: 07/15/1987    Years since quitting: 31.1  . Smokeless tobacco: Never Used  Substance Use Topics  . Alcohol use: Yes    Comment: 07/27/2018 "probably 2 glasses of wine/month  . Drug use: No     Allergies   Citalopram hydrobromide and Trazodone and nefazodone   Review of Systems Review of Systems  Constitutional: Negative for chills, fever and unexpected weight change.  Eyes: Negative for visual disturbance.  Respiratory: Negative for cough, chest tightness and shortness of breath.   Cardiovascular: Positive for chest pain. Negative for palpitations and leg swelling.  Gastrointestinal: Positive for abdominal pain and nausea. Negative for abdominal distention, blood in stool, constipation, diarrhea and vomiting.  Genitourinary: Positive for frequency and urgency. Negative for dysuria.  Musculoskeletal: Positive for back pain. Negative for neck pain and neck stiffness.  Skin: Negative for color change and wound.  Neurological: Negative for light-headedness and headaches.  All other systems reviewed and  are negative.    Physical Exam Updated Vital Signs BP 130/79   Pulse 79  Temp 97.6 F (36.4 C) (Oral)   Resp (!) 24   Ht 5' 4"  (1.626 m)   Wt 58.5 kg   SpO2 90%   BMI 22.14 kg/m   Physical Exam Vitals signs and nursing note reviewed.  Constitutional:      General: She is not in acute distress.    Appearance: She is well-developed.  HENT:     Head: Normocephalic and atraumatic.  Eyes:     Conjunctiva/sclera: Conjunctivae normal.  Neck:     Musculoskeletal: Normal range of motion and neck supple.     Vascular: No JVD.     Trachea: No tracheal deviation.  Cardiovascular:     Rate and Rhythm: Normal rate and regular rhythm.     Pulses:          Femoral pulses are 2+ on the right side and 2+ on the left side.      Dorsalis pedis pulses are 1+ on the right side and 1+ on the left side.       Posterior tibial pulses are 1+ on the right side and 1+ on the left side.     Heart sounds: Murmur present.     Comments: Catheterization site in left groin appears well-healed. Pulmonary:     Effort: Pulmonary effort is normal. No accessory muscle usage or respiratory distress.     Breath sounds: Normal breath sounds. No decreased breath sounds, wheezing or rhonchi.  Abdominal:     General: Bowel sounds are normal.     Palpations: Abdomen is soft. There is no mass.     Tenderness: There is abdominal tenderness (Generally, worse in LLQ. ).  Musculoskeletal:     Right lower leg: She exhibits no tenderness. No edema.     Left lower leg: She exhibits no tenderness. No edema.  Skin:    General: Skin is warm and dry.  Neurological:     General: No focal deficit present.     Mental Status: She is alert.     Cranial Nerves: No cranial nerve deficit.  Psychiatric:        Mood and Affect: Mood normal.        Behavior: Behavior normal.      ED Treatments / Results  Labs (all labs ordered are listed, but only abnormal results are displayed) Labs Reviewed  BASIC METABOLIC PANEL -  Abnormal; Notable for the following components:      Result Value   Glucose, Bld 146 (*)    Creatinine, Ser 1.12 (*)    Calcium 11.9 (*)    GFR calc non Af Amer 48 (*)    GFR calc Af Amer 55 (*)    Anion gap 16 (*)    All other components within normal limits  CBC - Abnormal; Notable for the following components:   RBC 5.53 (*)    Hemoglobin 15.1 (*)    All other components within normal limits  PROTIME-INR - Abnormal; Notable for the following components:   Prothrombin Time 27.3 (*)    All other components within normal limits  HEPATIC FUNCTION PANEL - Abnormal; Notable for the following components:   AST 103 (*)    ALT 88 (*)    Alkaline Phosphatase 388 (*)    Bilirubin, Direct 0.3 (*)    All other components within normal limits  URINALYSIS, ROUTINE W REFLEX MICROSCOPIC - Abnormal; Notable for the following components:   Specific Gravity, Urine >1.046 (*)    Hgb urine dipstick SMALL (*)  Protein, ur 30 (*)    All other components within normal limits  URINE CULTURE  LIPASE, BLOOD  I-STAT TROPONIN, ED    EKG EKG Interpretation  Date/Time:  Monday August 23 2018 06:28:16 EST Ventricular Rate:  94 PR Interval:    QRS Duration: 108 QT Interval:  433 QTC Calculation: 542 R Axis:   -113 Text Interpretation:  Atrial flutter Left anterior fascicular block Low voltage, precordial leads Right ventricular hypertrophy Prolonged QT interval No significant change since 2009 No longer paced as in previous EKG Confirmed by Ward, Cyril Mourning [phone]) on 08/23/2018 6:37:40 AM Also confirmed by Malvin Johns [phone])  on 08/23/2018 7:51:02 AM Also confirmed by Malvin Johns [phone]), editor Lynder Parents [phone])  on 08/23/2018 12:32:54 PM   Radiology Dg Chest 2 View  Result Date: 08/23/2018 CLINICAL DATA:  Chest and back pain EXAM: CHEST - 2 VIEW COMPARISON:  08/31/2013 FINDINGS: Normal heart size. Prior median sternotomy. Dual-chamber pacer leads in stable position. Trace pleural  fluid. No pulmonary edema or air bronchogram. No pneumothorax. Posterior left fourth rib appears to be newly eroded. This area was not covered on a January 2020 CT. IMPRESSION: The posterior left fourth rib is diffusely eroded, concerning for an aggressive/malignant process. Recommend chest CT with contrast if possible. Electronically Signed   By: Monte Fantasia M.D.   On: 08/23/2018 07:19   Ct Chest W Contrast  Result Date: 08/23/2018 CLINICAL DATA:  Recent atrial ablation, persistent back and chest discomfort, abnormal chest radiograph with left fourth rib lesion EXAM: CT CHEST, ABDOMEN, AND PELVIS WITH CONTRAST TECHNIQUE: Multidetector CT imaging of the chest, abdomen and pelvis was performed following the standard protocol during bolus administration of intravenous contrast. CONTRAST:  188m OMNIPAQUE IOHEXOL 300 MG/ML  SOLN COMPARISON:  Same day chest radiograph, CT abdomen pelvis, 09/01/2013 FINDINGS: CT CHEST FINDINGS Cardiovascular: Cardiomegaly with left chest multi lead pacer defibrillator and evidence of atrial ablation. No pericardial effusion. Mediastinum/Nodes: No enlarged mediastinal, hilar, or axillary lymph nodes. Thyroid gland, trachea, and esophagus demonstrate no significant findings. Lungs/Pleura: Lungs are clear.  Trace pleural effusion. Musculoskeletal: There are destructive, expansile lesions of the left fourth and fifth ribs, in keeping with findings of prior radiograph. There are numerous lytic lesions of the thoracic spine and a superior endplate fracture associated with a lytic lesion of the T11 vertebral body (series 7, image 85). CT ABDOMEN PELVIS FINDINGS Hepatobiliary: There is a large, hypodense mass lesion of the central inferior right lobe of the liver, centered in segment V, with numerous additional small hypodense lesions. Tiny gallstones in the gallbladder. Pancreas: Unremarkable. No pancreatic ductal dilatation or surrounding inflammatory changes. Spleen: Normal in size  without focal abnormality. Adrenals/Urinary Tract: Adrenal glands are unremarkable. Kidneys are normal, without renal calculi, focal lesion, or hydronephrosis. Bladder is unremarkable. Stomach/Bowel: Stomach is within normal limits. Appendix appears normal. No evidence of bowel wall thickening, distention, or inflammatory changes. Vascular/Lymphatic: No significant vascular findings are present. No enlarged abdominal or pelvic lymph nodes. Reproductive: No mass or other abnormality. Status post hysterectomy. Other: No abdominal wall hernia or abnormality. No abdominopelvic ascites. Musculoskeletal: Numerous lytic lesions of the lumbar spine pelvis, and proximal femurs. IMPRESSION: 1. There is a large, hypodense mass lesion of the central inferior right lobe of the liver, centered in segment V, with numerous additional small hypodense lesions. 2. Numerous lytic lesions of the included skeleton, particularly including left rib lesions corresponding to radiographic findings, a superior endplate fracture associated with a lesion of the T11 vertebral  body and lesions involving the pelvis and bilateral proximal femurs. 3. 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 4.  Other chronic and incidental findings as detailed above. Electronically Signed   By: Eddie Candle M.D.   On: 08/23/2018 08:47   Ct Abdomen Pelvis W Contrast  Result Date: 08/23/2018 CLINICAL DATA:  Recent atrial ablation, persistent back and chest discomfort, abnormal chest radiograph with left fourth rib lesion EXAM: CT CHEST, ABDOMEN, AND PELVIS WITH CONTRAST TECHNIQUE: Multidetector CT imaging of the chest, abdomen and pelvis was performed following the standard protocol during bolus administration of intravenous contrast. CONTRAST:  136m OMNIPAQUE IOHEXOL 300 MG/ML  SOLN COMPARISON:  Same day chest radiograph, CT abdomen pelvis, 09/01/2013 FINDINGS:  CT CHEST FINDINGS Cardiovascular: Cardiomegaly with left chest multi lead pacer defibrillator and evidence of atrial ablation. No pericardial effusion. Mediastinum/Nodes: No enlarged mediastinal, hilar, or axillary lymph nodes. Thyroid gland, trachea, and esophagus demonstrate no significant findings. Lungs/Pleura: Lungs are clear.  Trace pleural effusion. Musculoskeletal: There are destructive, expansile lesions of the left fourth and fifth ribs, in keeping with findings of prior radiograph. There are numerous lytic lesions of the thoracic spine and a superior endplate fracture associated with a lytic lesion of the T11 vertebral body (series 7, image 85). CT ABDOMEN PELVIS FINDINGS Hepatobiliary: There is a large, hypodense mass lesion of the central inferior right lobe of the liver, centered in segment V, with numerous additional small hypodense lesions. Tiny gallstones in the gallbladder. Pancreas: Unremarkable. No pancreatic ductal dilatation or surrounding inflammatory changes. Spleen: Normal in size without focal abnormality. Adrenals/Urinary Tract: Adrenal glands are unremarkable. Kidneys are normal, without renal calculi, focal lesion, or hydronephrosis. Bladder is unremarkable. Stomach/Bowel: Stomach is within normal limits. Appendix appears normal. No evidence of bowel wall thickening, distention, or inflammatory changes. Vascular/Lymphatic: No significant vascular findings are present. No enlarged abdominal or pelvic lymph nodes. Reproductive: No mass or other abnormality. Status post hysterectomy. Other: No abdominal wall hernia or abnormality. No abdominopelvic ascites. Musculoskeletal: Numerous lytic lesions of the lumbar spine pelvis, and proximal femurs. IMPRESSION: 1. There is a large, hypodense mass lesion of the central inferior right lobe of the liver, centered in segment V, with numerous additional small hypodense lesions. 2. Numerous lytic lesions of the included skeleton, particularly  including left rib lesions corresponding to radiographic findings, a superior endplate fracture associated with a lesion of the T11 vertebral body and lesions involving the pelvis and bilateral proximal femurs. 3. 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 4.  Other chronic and incidental findings as detailed above. Electronically Signed   By: AEddie CandleM.D.   On: 08/23/2018 08:47    Procedures Procedures (including critical care time)  Medications Ordered in ED Medications  iohexol (OMNIPAQUE) 300 MG/ML solution 100 mL (100 mLs Intravenous Contrast Given 08/23/18 0803)  morphine 2 MG/ML injection 2 mg (2 mg Intravenous Given 08/23/18 0948)  sodium chloride 0.9 % bolus 500 mL (0 mLs Intravenous Stopped 08/23/18 1153)     Initial Impression / Assessment and Plan / ED Course  I have reviewed the triage vital signs and the nursing notes.  Pertinent labs & imaging results that were available during my care of the patient were reviewed by me and considered in my medical decision making (see chart for details).  Clinical Course as of Aug 23 1504  Mon Aug 23, 2018  0746 Concern for  pathologic rib erosion, recommended CT chest with contrast.  Already planned to order CT abd, will order CT chest also.   DG Chest 2 View [EH]  [phone] "Overall constellation of findings is consistent with advanced metastatic disease, likely primary cholangiocarcinoma or hepatocellular carcinoma."    CT Abdomen Pelvis W Contrast [EH]  0945 Patient informed of results.   [EH]  [phone] Spoke with patient's PCP Dr. Laurann Montana who will see patient tomorrow.    [EH]  1141 Called lab, they said that they do not see the UA that has been ordered.  I am able to see it on my side.  Will reorder UA.  Messaged patient's RN for need of urine sample.   [EH]  1142 UA re-ordered.    [EH]  1347 Spoke with hospitalist who agreed to  admit patient.   [EH]    Clinical Course User Index [EH] Lorin Glass, PA-C   Patient is a 78 year old woman who presents today for evaluation of pain in her chest, back, and generally feeling weak.  She recently had an ablation and since then has had chest pain which cardiology felt to be pericarditis related to her recent procedure.  Labs are obtained and reviewed, globin is slightly elevated at 15.  Her calcium is high at 11.9 with elevated alk phos, AST, ALT, and direct bilirubin.  Her creatinine is slightly high at 1.12.  With elevated alk phos, and calcium concern for underlying lytic process.  Chest x-ray showed concern for lytic lesion of the left fourth rib.  She has generalized abdominal pain, therefore CT chest, abdomen, and pelvis were ordered.  CT scan shows multiple liver masses with many lytic lesions of ribs, spine, hips, and pelvis with concern for advanced metastatic disease, likely a primary cholangiocarcinoma or hepatocellular carcinoma.  Patient's pain was treated in the emergency room with morphine.  I spoke with her primary care doctor, who says that if patient is discharged he will be able to see her tomorrow to start this process evaluation.  I told patient her results, she has had difficulty ambulating at home due to pain and weakness.  Given that she is anticoagulated with warfarin, and would need to be bridged to heparin prior to biopsy I consulted hospitalist.  I spoke with Dr. Sloan Leiter who agreed to admit the patient.  Chaplain was asked to see patient to provide emotional support.   This patient was discussed with Dr. Tamera Punt.    Final Clinical Impressions(s) / ED Diagnoses   Final diagnoses:  Mass of multiple sites of liver  Lytic bone lesions on xray  Metastatic cancer [facility])    ED Discharge Orders    None       Lorin Glass, PA-C 08/23/18 1513    Malvin Johns, MD 08/23/18 1526

## 2018-08-23 NOTE — ED Notes (Signed)
Called lab to add on blood. Pt transported to radiology.

## 2018-08-23 NOTE — ED Notes (Signed)
Report attempted 

## 2018-08-23 NOTE — Progress Notes (Signed)
ANTICOAGULATION CONSULT NOTE - Initial Consult  Pharmacy Consult for Heparin while warfarin on hold Indication: atrial fibrillation  Allergies  Allergen Reactions  . Citalopram Hydrobromide Other (See Comments)    Headache and insomnia  . Trazodone And Nefazodone Other (See Comments)    Weakness.    Patient Measurements: Height: 5\' 4"  (162.6 cm) Weight: 129 lb (58.5 kg) IBW/kg (Calculated) : 54.7  Vital Signs: Temp: 97.6 F (36.4 C) (02/10 0627) Temp Source: Oral (02/10 0627) BP: 135/95 (02/10 1430) Pulse Rate: 83 (02/10 1430)  Labs: Recent Labs    08/23/18 0626 08/23/18 0645  HGB 15.1*  --   HCT 45.6  --   PLT 193  --   LABPROT  --  27.3*  INR  --  2.58  CREATININE 1.12*  --     Estimated Creatinine Clearance: 36.9 mL/min (A) (by C-G formula based on SCr of 1.12 mg/dL (H)).   Medical History: Past Medical History:  Diagnosis Date  . Anxiety   . Atrial fibrillation ([place])    persistent  . CAD (coronary artery disease)   . Chronic diastolic CHF (congestive heart failure) ([place])   . 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 Glaucoma   . H/O mitral valve repair 2005   [place].  . H/O tricuspid valve repair   . Hx of acquired endocarditis 1970; 1986   Prior to mitral valve repair   . Hyperlipidemia    cardiac cath clean coronary arties in the past.  . Hypothyroid   . Insomnia 12/11/2015  . Kidney stone 10/2013   "pass it"  . Long term (current) use of anticoagulants    No bleeding  . Macular degeneration    early , [place]  . OSA (obstructive sleep apnea) 08/14/2015   Mild with AHI 7/hr  . Symptomatic bradycardia    s/p PPM  . Thyroid nodule     Assessment: 78 year old female on warfarin prior to admission for Afib INR on admission = 2.58 Pharmacy to dose heparin while warfarin on hold in preparation for biopsy  Goal of Therapy:  Heparin level 0.3-0.7 units/ml Monitor platelets by anticoagulation  protocol: Yes   Plan:  Start heparin when INR < 2 Daily INR  Thank you Anette Guarneri, PharmD [phone]  Tad Moore 08/23/2018,3:30 PM

## 2018-08-23 NOTE — ED Notes (Signed)
Kelly from [employer] called & confirmed recieving pt's pacemaker interrogation information, awaiting a fax with the information.

## 2018-08-23 NOTE — ED Notes (Signed)
Neighbor, Amado Coe, would like a call from the pt when able at [phone]

## 2018-08-23 NOTE — ED Notes (Signed)
Pacemaker interrigated

## 2018-08-23 NOTE — Progress Notes (Signed)
Findings noted on CT and are worrisome.  Will need further workup.  Given that she had Afib ablation within the past 60 days, she should not have her anticoagulation interrupted for any reason due to markedly increased stroke risks in the first 2-3 months post ablation. She would requiring perioperative bridging for any procedures performed at this time.  Please reach out to me if I can assist further.  Thompson Grayer MD, [facility] 08/23/2018 9:39 AM

## 2018-08-23 NOTE — H&P (Signed)
History and Physical    Jo Hughes MHD:[id number] DOB: May 17, 1941 DOA: 08/23/2018  PCP: Lavone Orn, MD  Patient coming from: home   I have personally briefly reviewed patient's old medical records available.   Chief Complaint: Chest wall pain, back pain, hip pain.  HPI: Jo Hughes is a 78 y.o. female with medical history significant of persistent atrial fibrillation status post ablation, currently on Tikosyn and Coumadin, mitral valve repair, chronic diastolic heart failure, coronary artery disease hypothyroidism, symptomatic bradycardia status post pacemaker who presents to the emergency room with ongoing pain in various part of the body, aggravated recently and present since 3 months.  According to the patient, she had a started these pains mostly on the back of the left chest wall, middle of the back, both groins and also on the hip bones.  This started about 3 months ago, she had atrial fibrillation ablation done last month.  She was also treated with colchicine with presumptive diagnosis of pericarditis after procedure.  Patient complains of pain occasionally at 8 out of 10, aggravated by movement, spasmodic. Patient also has lost about 10 pounds of weight in last 1 month.  She has good appetite, however does not have much access to food as she is not able to cook herself because of pain. Denies any nausea or vomiting.  Denies any hematemesis, hemoptysis or hematochezia.  Denies any shortness of breath.  Denies any wheezing. No personal or family history of cancer. ED Course: Hemodynamically stable in the emergency room.  Creatinine is 1.12, calcium is 11.9.  A chest x-ray showed probable malignant lesion on the left fourth rib that prompted a CT scan of the chest abdomen and pelvis that shows 2 hypodense lesions on liver, multiple lytic lesions on the left posterior ribs, T12 lytic lesion as well as metastatic lytic lesion on pelvis.  Her INR is 2.58.  Cardiology suggested not to stop  Coumadin because of recent ablation and high risk of thromboembolism.  I called and discussed case with interventional radiology, will bridge with heparin.  Recheck INR in the morning.  N.p.o. past midnight.  They will try to get biopsy from rib and sacrum.  Review of Systems: As per HPI otherwise 10 point review of systems negative.    Past Medical History:  Diagnosis Date  . Anxiety   . Atrial fibrillation ([place])    persistent  . CAD (coronary artery disease)   . Chronic diastolic CHF (congestive heart failure) ([place])   . 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 Glaucoma   . H/O mitral valve repair 2005   [place].  . H/O tricuspid valve repair   . Hx of acquired endocarditis 1970; 1986   Prior to mitral valve repair   . Hyperlipidemia    cardiac cath clean coronary arties in the past.  . Hypothyroid   . Insomnia 12/11/2015  . Kidney stone 10/2013   "pass it"  . Long term (current) use of anticoagulants    No bleeding  . Macular degeneration    early , Hecker  . OSA (obstructive sleep apnea) 08/14/2015   Mild with AHI 7/hr  . Symptomatic bradycardia    s/p PPM  . Thyroid nodule     Past Surgical History:  Procedure Laterality Date  . ABDOMINAL HYSTERECTOMY  1986  . ATRIAL FIBRILLATION ABLATION N/A 11/08/2013   Procedure: ATRIAL FIBRILLATION ABLATION;  Surgeon: Coralyn Mark, MD;  Location[facility]  CATH LAB;  Service: Cardiovascular;  Laterality: N/A;  . ATRIAL FIBRILLATION ABLATION N/A 07/27/2018   Procedure: ATRIAL FIBRILLATION ABLATION;  Surgeon: Thompson Grayer, MD;  Location: [place] CV LAB;  Service: Cardiovascular;  Laterality: N/A;  . BACK SURGERY    . BREAST BIOPSY Left X 2   "benign"  . CARDIAC CATHETERIZATION  X 3  . CARDIOVERSION  ~ 2011; 09/18/2011   ?; Procedure: CARDIOVERSION;  Surgeon: Sinclair Grooms, MD;  Location: [place];  Service: Cardiovascular;  Laterality: N/A;  . CARDIOVERSION N/A 04/29/2013   Procedure:  CARDIOVERSION;  Surgeon: Sinclair Grooms, MD;  Location: [place];  Service: Cardiovascular;  Laterality: N/A;  . CATARACT EXTRACTION W/ INTRAOCULAR LENS  IMPLANT, BILATERAL  ~ 2011  . DILATION AND CURETTAGE OF UTERUS    . INSERT / REPLACE / REMOVE PACEMAKER  01/13/2012   MDT Adapta L implanted by Dr Rayann Heman  . KNEE CARTILAGE SURGERY Left 02/2009   scope; Dr. Berenice Primas  . LUMBAR LAMINECTOMY  1997  . MITRAL VALVE REPAIR  05/2004   [facility]  . PERMANENT PACEMAKER INSERTION N/A 01/13/2012   Procedure: PERMANENT PACEMAKER INSERTION;  Surgeon: Thompson Grayer, MD;  Location: [facility] CATH LAB;  Service: Cardiovascular;  Laterality: N/A;  . TEE WITHOUT CARDIOVERSION N/A 11/07/2013   Procedure: TRANSESOPHAGEAL ECHOCARDIOGRAM (TEE);  Surgeon: Pixie Casino, MD;  Location: [facility] ENDOSCOPY;  Service: Cardiovascular;  Laterality: N/A;  . TONSILLECTOMY  1948  . TRICUSPID VALVE SURGERY  05/2004   "repair; [facility]"     reports that she quit smoking about 31 years ago. Her smoking use included cigarettes. She has a 24.00 pack-year smoking history. She has never used smokeless tobacco. She reports current alcohol use. She reports that she does not use drugs.  Allergies  Allergen Reactions  . Citalopram Hydrobromide Other (See Comments)    Headache and insomnia  . Trazodone And Nefazodone Other (See Comments)    Weakness.    Family History  Problem Relation Age of Onset  . Stroke Father   . Heart attack Father 3  . Heart disease Father   . Hyperlipidemia Father   . Hypertension Father   . Macular degeneration Mother   . Transient ischemic attack Mother        multiple  . Varicose Veins Mother   . Hypertension Brother      Prior to Admission medications   Medication Sig Start Date End Date Taking? Authorizing Provider  ALPRAZolam Duanne Moron) 0.5 MG tablet Take 0.5 mg by mouth 2 (two) times daily as needed for anxiety.     [provider]  amoxicillin (AMOXIL) 500 MG capsule  Take 2,000 mg by mouth See admin instructions. Take four 4 capsules (2000 mg) by mouth one (1) hour prior to dental procedures. 08/22/14   [provider]  colchicine 0.6 MG tablet Take 1 tablet (0.6 mg total) by mouth 2 (two) times daily. 08/13/18 08/13/19  Sherran Needs, NP  cycloSPORINE (RESTASIS) 0.05 % ophthalmic emulsion Place 1 drop into both eyes 2 (two) times daily.     [provider]  dofetilide (TIKOSYN) 125 MCG capsule Take 1 capsule by mouth twice daily along with a 250 mcg capsule Patient taking differently: Take 125 mcg by mouth 2 (two) times daily.  11/11/17   Allred, Jeneen Rinks, MD  dofetilide (TIKOSYN) 250 MCG capsule Take 1 capsule by mouth twice daily along with a 125 mcg capsule Patient taking differently: Take 250 mcg by mouth 2 (  two) times daily.  11/11/17   Allred, Jeneen Rinks, MD  dorzolamide-timolol (COSOPT) 22.3-6.8 MG/ML ophthalmic solution Place 1 drop into both eyes 2 (two) times daily.    [provider]  furosemide (LASIX) 80 MG tablet Take 80 mg by mouth every evening.  10/29/14   [provider]  HYDROcodone-acetaminophen (NORCO/VICODIN) 5-325 MG per tablet Take 1 tablet by mouth every 6 (six) hours as needed for moderate pain.    [provider]  latanoprost (XALATAN) 0.005 % ophthalmic solution Place 1 drop into both eyes at bedtime.     [provider]  metoprolol succinate (TOPROL-XL) 25 MG 24 hr tablet TAKE 2 TABLETS BY MOUTH ONCE DAILY. 08/18/18   Belva Crome, MD  Multiple Vitamins-Minerals (ICAPS) CAPS Take 1 capsule by mouth 2 (two) times daily.     [provider]  Omega-3 Fatty Acids (FISH OIL) 1000 MG CAPS Take 1,000 mg by mouth 2 (two) times daily.     [provider]  pantoprazole (PROTONIX) 40 MG tablet Take 1 tablet (40 mg total) by mouth daily. 07/28/18 09/11/18  Baldwin Jamaica, PA-C  PARoxetine (PAXIL) 20 MG tablet Take 1 tablet (20 mg total) by mouth daily. 06/14/15   Allred, Jeneen Rinks, MD    potassium chloride SA (K-DUR,KLOR-CON) 20 MEQ tablet TAKE 1 TABLET BY MOUTH TWICE DAILY. 09/04/17   Allred, Jeneen Rinks, MD  rosuvastatin (CRESTOR) 40 MG tablet Take 40 mg by mouth every evening.     [provider]  SYNTHROID 75 MCG tablet Take 75 mcg by mouth daily before breakfast. 05/17/18   [provider]  traMADol (ULTRAM) 50 MG tablet Take 50 mg by mouth every 12 (twelve) hours as needed for moderate pain.     [provider]  warfarin (COUMADIN) 5 MG tablet TAKE AS DIRECTED BY COUMADIN CLINIC. Patient taking differently: Take 2.5-5 mg by mouth See admin instructions. Take as directed by Coumadin Clinic.  Take 0.5 tablet (2.5 mg) by mouth on Saturday & Tuesday; take 1 tablet (5 mg) by mouth on all other days of the week. (07/19/2018) 05/10/18   Belva Crome, MD  ZIOPTAN 0.0015 % SOLN Place 1 drop into both eyes at bedtime.  03/28/13   [provider]  zolmitriptan (ZOMIG) 5 MG tablet Take 5 mg by mouth daily as needed for migraine.     [provider]    Physical Exam: Vitals:   08/23/18 1045 08/23/18 1114 08/23/18 1200 08/23/18 1230  BP: 128/77 130/86 133/80 130/79  Pulse: (!) 103  82 79  Resp: (!) 23 (!) 23 (!) 21 (!) 24  Temp:      TempSrc:      SpO2: (!) 84%  93% 90%  Weight:      Height:        Constitutional: NAD, calm, comfortable Vitals:   08/23/18 1045 08/23/18 1114 08/23/18 1200 08/23/18 1230  BP: 128/77 130/86 133/80 130/79  Pulse: (!) 103  82 79  Resp: (!) 23 (!) 23 (!) 21 (!) 24  Temp:      TempSrc:      SpO2: (!) 84%  93% 90%  Weight:      Height:       Eyes: PERRL, lids and conjunctivae normal ENMT: Mucous membranes are moist. Posterior pharynx clear of any exudate or lesions.Normal dentition.  Neck: normal, supple, no masses, no thyromegaly Respiratory: clear to auscultation bilaterally, no wheezing, no crackles. Normal respiratory effort. No accessory muscle use.  Cardiovascular: Regular  rate and rhythm, no  murmurs / rubs / gallops. No extremity edema. 2+ pedal pulses. No carotid bruits.  Pacemaker in place. Abdomen: no tenderness, no masses palpated. No hepatosplenomegaly. Bowel sounds positive.  Musculoskeletal: no clubbing / cyanosis. No joint deformity upper and lower extremities. Good ROM, no contractures. Normal muscle tone.  Skin: no rashes, lesions, ulcers. No induration Neurologic: CN 2-12 grossly intact. Sensation intact, DTR normal. Strength 5/5 in all 4.  Psychiatric: Normal judgment and insight. Alert and oriented x 3.  Anxious.  No reproducible tenderness.  Labs on Admission: I have personally reviewed following labs and imaging studies  CBC: Recent Labs  Lab 08/23/18 0626  WBC 9.1  HGB 15.1*  HCT 45.6  MCV 82.5  PLT 834   Basic Metabolic Panel: Recent Labs  Lab 08/23/18 0626  NA 138  K 3.8  CL 99  CO2 23  GLUCOSE 146*  BUN 21  CREATININE 1.12*  CALCIUM 11.9*   GFR: Estimated Creatinine Clearance: 36.9 mL/min (A) (by C-G formula based on SCr of 1.12 mg/dL (H)). Liver Function Tests: Recent Labs  Lab 08/23/18 0645  AST 103*  ALT 88*  ALKPHOS 388*  BILITOT 1.0  PROT 7.6  ALBUMIN 3.5   Recent Labs  Lab 08/23/18 0645  LIPASE 40   No results for input(s): AMMONIA in the last 168 hours. Coagulation Profile: Recent Labs  Lab 08/17/18 1322 08/17/18 1334 08/23/18 0645  INR 6.8* 7.8* 2.58   Cardiac Enzymes: No results for input(s): CKTOTAL, CKMB, CKMBINDEX, TROPONINI in the last 168 hours. BNP (last 3 results) No results for input(s): PROBNP in the last 8760 hours. HbA1C: No results for input(s): HGBA1C in the last 72 hours. CBG: No results for input(s): GLUCAP in the last 168 hours. Lipid Profile: No results for input(s): CHOL, HDL, LDLCALC, TRIG, CHOLHDL, LDLDIRECT in the last 72 hours. Thyroid Function Tests: No results for input(s): TSH, T4TOTAL, FREET4, T3FREE, THYROIDAB in the last 72 hours. Anemia Panel: No results for input(s):  VITAMINB12, FOLATE, FERRITIN, TIBC, IRON, RETICCTPCT in the last 72 hours. Urine analysis:    Component Value Date/Time   COLORURINE YELLOW 08/23/2018 1151   APPEARANCEUR CLEAR 08/23/2018 1151   LABSPEC >1.046 (H) 08/23/2018 1151   PHURINE 5.0 08/23/2018 1151   GLUCOSEU NEGATIVE 08/23/2018 1151   HGBUR SMALL (A) 08/23/2018 1151   BILIRUBINUR NEGATIVE 08/23/2018 1151   KETONESUR NEGATIVE 08/23/2018 1151   PROTEINUR 30 (A) 08/23/2018 1151   UROBILINOGEN 0.2 09/01/2013 0110   NITRITE NEGATIVE 08/23/2018 1151   LEUKOCYTESUR NEGATIVE 08/23/2018 1151    Radiological Exams on Admission: Dg Chest 2 View  Result Date: 08/23/2018 CLINICAL DATA:  Chest and back pain EXAM: CHEST - 2 VIEW COMPARISON:  08/31/2013 FINDINGS: Normal heart size. Prior median sternotomy. Dual-chamber pacer leads in stable position. Trace pleural fluid. No pulmonary edema or air bronchogram. No pneumothorax. Posterior left fourth rib appears to be newly eroded. This area was not covered on a January 2020 CT. IMPRESSION: The posterior left fourth rib is diffusely eroded, concerning for an aggressive/malignant process. Recommend chest CT with contrast if possible. Electronically Signed   By: Monte Fantasia M.D.   On: 08/23/2018 07:19   Ct Chest W Contrast  Result Date: 08/23/2018 CLINICAL DATA:  Recent atrial ablation, persistent back and chest discomfort, abnormal chest radiograph with left fourth rib lesion EXAM: CT CHEST, ABDOMEN, AND PELVIS WITH CONTRAST TECHNIQUE: Multidetector CT imaging of the chest, abdomen and pelvis was performed following the standard protocol during  bolus administration of intravenous contrast. CONTRAST:  157mL OMNIPAQUE IOHEXOL 300 MG/ML  SOLN COMPARISON:  Same day chest radiograph, CT abdomen pelvis, 09/01/2013 FINDINGS: CT CHEST FINDINGS Cardiovascular: Cardiomegaly with left chest multi lead pacer defibrillator and evidence of atrial ablation. No pericardial effusion. Mediastinum/Nodes: No  enlarged mediastinal, hilar, or axillary lymph nodes. Thyroid gland, trachea, and esophagus demonstrate no significant findings. Lungs/Pleura: Lungs are clear.  Trace pleural effusion. Musculoskeletal: There are destructive, expansile lesions of the left fourth and fifth ribs, in keeping with findings of prior radiograph. There are numerous lytic lesions of the thoracic spine and a superior endplate fracture associated with a lytic lesion of the T11 vertebral body (series 7, image 85). CT ABDOMEN PELVIS FINDINGS Hepatobiliary: There is a large, hypodense mass lesion of the central inferior right lobe of the liver, centered in segment V, with numerous additional small hypodense lesions. Tiny gallstones in the gallbladder. Pancreas: Unremarkable. No pancreatic ductal dilatation or surrounding inflammatory changes. Spleen: Normal in size without focal abnormality. Adrenals/Urinary Tract: Adrenal glands are unremarkable. Kidneys are normal, without renal calculi, focal lesion, or hydronephrosis. Bladder is unremarkable. Stomach/Bowel: Stomach is within normal limits. Appendix appears normal. No evidence of bowel wall thickening, distention, or inflammatory changes. Vascular/Lymphatic: No significant vascular findings are present. No enlarged abdominal or pelvic lymph nodes. Reproductive: No mass or other abnormality. Status post hysterectomy. Other: No abdominal wall hernia or abnormality. No abdominopelvic ascites. Musculoskeletal: Numerous lytic lesions of the lumbar spine pelvis, and proximal femurs. IMPRESSION: 1. There is a large, hypodense mass lesion of the central inferior right lobe of the liver, centered in segment V, with numerous additional small hypodense lesions. 2. Numerous lytic lesions of the included skeleton, particularly including left rib lesions corresponding to radiographic findings, a superior endplate fracture associated with a lesion of the T11 vertebral body and lesions involving the pelvis  and bilateral proximal femurs. 3. 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 4.  Other chronic and incidental findings as detailed above. Electronically Signed   By: Eddie Candle M.D.   On: 08/23/2018 08:47   Ct Abdomen Pelvis W Contrast  Result Date: 08/23/2018 CLINICAL DATA:  Recent atrial ablation, persistent back and chest discomfort, abnormal chest radiograph with left fourth rib lesion EXAM: CT CHEST, ABDOMEN, AND PELVIS WITH CONTRAST TECHNIQUE: Multidetector CT imaging of the chest, abdomen and pelvis was performed following the standard protocol during bolus administration of intravenous contrast. CONTRAST:  17mL OMNIPAQUE IOHEXOL 300 MG/ML  SOLN COMPARISON:  Same day chest radiograph, CT abdomen pelvis, 09/01/2013 FINDINGS: CT CHEST FINDINGS Cardiovascular: Cardiomegaly with left chest multi lead pacer defibrillator and evidence of atrial ablation. No pericardial effusion. Mediastinum/Nodes: No enlarged mediastinal, hilar, or axillary lymph nodes. Thyroid gland, trachea, and esophagus demonstrate no significant findings. Lungs/Pleura: Lungs are clear.  Trace pleural effusion. Musculoskeletal: There are destructive, expansile lesions of the left fourth and fifth ribs, in keeping with findings of prior radiograph. There are numerous lytic lesions of the thoracic spine and a superior endplate fracture associated with a lytic lesion of the T11 vertebral body (series 7, image 85). CT ABDOMEN PELVIS FINDINGS Hepatobiliary: There is a large, hypodense mass lesion of the central inferior right lobe of the liver, centered in segment V, with numerous additional small hypodense lesions. Tiny gallstones in the gallbladder. Pancreas: Unremarkable. No pancreatic ductal dilatation or surrounding inflammatory changes. Spleen: Normal in size without focal abnormality. Adrenals/Urinary Tract: Adrenal glands are  unremarkable. Kidneys  are normal, without renal calculi, focal lesion, or hydronephrosis. Bladder is unremarkable. Stomach/Bowel: Stomach is within normal limits. Appendix appears normal. No evidence of bowel wall thickening, distention, or inflammatory changes. Vascular/Lymphatic: No significant vascular findings are present. No enlarged abdominal or pelvic lymph nodes. Reproductive: No mass or other abnormality. Status post hysterectomy. Other: No abdominal wall hernia or abnormality. No abdominopelvic ascites. Musculoskeletal: Numerous lytic lesions of the lumbar spine pelvis, and proximal femurs. IMPRESSION: 1. There is a large, hypodense mass lesion of the central inferior right lobe of the liver, centered in segment V, with numerous additional small hypodense lesions. 2. Numerous lytic lesions of the included skeleton, particularly including left rib lesions corresponding to radiographic findings, a superior endplate fracture associated with a lesion of the T11 vertebral body and lesions involving the pelvis and bilateral proximal femurs. 3. 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 4.  Other chronic and incidental findings as detailed above. Electronically Signed   By: Eddie Candle M.D.   On: 08/23/2018 08:47    EKG: Independently reviewed.  Shows a flutter with left anterior fascicular block.  QTc is 542.  Previous EKG with ventricular paced rhythm and QTC of 547.  Assessment/Plan Principal Problem:   Metastatic bone tumor (HCC) Active Problems:   Atypical atrial flutter (HCC)   Pacemaker-[employer]   Status post mitral valve repair   Chronic diastolic heart failure (HCC)   Hyperlipidemia   Hypothyroid   Metastatic cancer to bone ([place])     1.  Metastatic bone tumor/cancer pain/hypercalcemia: Agree with admission given severity of symptoms.  Patient will need tissue biopsy and bridging of  anticoagulation.  Interventional radiology consulted. Stop Coumadin, start on heparin maintenance doses.  Repeat INR in the morning. Adequate pain medication with IV and oral opiates for pain relief.  Ambulate. Hematology oncology consult after biopsy. Gentle IV fluid hydration for hypercalcemia.  2.  Chronic atrial fibrillation: Rate controlled.  Patient is currently on Tikosyn.  Stopping Coumadin and putting on heparin as above.  3.  Chronic diastolic heart failure: Currently euvolemic and compensated.  4.  Hypothyroidism: Currently euthyroid.  On Synthroid replacement.  5.  Sick sinus syndrome: Status post pacemaker.  Stable.   DVT prophylaxis: On Coumadin. Code Status: Full code. Family Communication: Patient with medical decision-making capacity. Disposition Plan: Home with home care. Consults called: Interventional radiology. Admission status: Inpatient.   Barb Merino MD [facility] Pager [phone]  If 7PM-7AM, please contact night-coverage [email or web address] Password [email or web address]  08/23/2018, 2:54 PM

## 2018-08-24 DIAGNOSIS — M899 Disorder of bone, unspecified: Secondary | ICD-10-CM | POA: Diagnosis present

## 2018-08-24 DIAGNOSIS — E039 Hypothyroidism, unspecified: Secondary | ICD-10-CM

## 2018-08-24 DIAGNOSIS — R160 Hepatomegaly, not elsewhere classified: Secondary | ICD-10-CM | POA: Diagnosis present

## 2018-08-24 DIAGNOSIS — R945 Abnormal results of liver function studies: Secondary | ICD-10-CM

## 2018-08-24 DIAGNOSIS — I5032 Chronic diastolic (congestive) heart failure: Secondary | ICD-10-CM

## 2018-08-24 DIAGNOSIS — R7989 Other specified abnormal findings of blood chemistry: Secondary | ICD-10-CM | POA: Diagnosis present

## 2018-08-24 DIAGNOSIS — I4819 Other persistent atrial fibrillation: Secondary | ICD-10-CM

## 2018-08-24 DIAGNOSIS — R52 Pain, unspecified: Secondary | ICD-10-CM

## 2018-08-24 DIAGNOSIS — Z9889 Other specified postprocedural states: Secondary | ICD-10-CM

## 2018-08-24 DIAGNOSIS — I484 Atypical atrial flutter: Secondary | ICD-10-CM

## 2018-08-24 LAB — COMPREHENSIVE METABOLIC PANEL
ALT: 68 U/L — ABNORMAL HIGH (ref 0–44)
ANION GAP: 10 (ref 5–15)
AST: 78 U/L — ABNORMAL HIGH (ref 15–41)
Albumin: 2.8 g/dL — ABNORMAL LOW (ref 3.5–5.0)
Alkaline Phosphatase: 352 U/L — ABNORMAL HIGH (ref 38–126)
BUN: 18 mg/dL (ref 8–23)
CHLORIDE: 105 mmol/L (ref 98–111)
CO2: 23 mmol/L (ref 22–32)
Calcium: 11.3 mg/dL — ABNORMAL HIGH (ref 8.9–10.3)
Creatinine, Ser: 1.03 mg/dL — ABNORMAL HIGH (ref 0.44–1.00)
GFR calc Af Amer: >60 mL/min (ref >60)
GFR calc non Af Amer: 53 mL/min — ABNORMAL LOW (ref >60)
Glucose, Bld: 113 mg/dL — ABNORMAL HIGH (ref 70–99)
Potassium: 3.8 mmol/L (ref 3.5–5.1)
Sodium: 138 mmol/L (ref 135–145)
Total Bilirubin: 1.2 mg/dL (ref 0.3–1.2)
Total Protein: 6 g/dL — ABNORMAL LOW (ref 6.5–8.1)

## 2018-08-24 LAB — URINE CULTURE: Culture: 60000 — AB

## 2018-08-24 LAB — PROTIME-INR
INR: 2.4
Prothrombin Time: 25.8 seconds — ABNORMAL HIGH (ref 11.4–15.2)

## 2018-08-24 MED ORDER — SODIUM CHLORIDE 0.9 % IV SOLN
INTRAVENOUS | Status: AC
  Administered 2018-08-24 – 2018-08-25 (×2): via INTRAVENOUS

## 2018-08-24 MED ORDER — POTASSIUM CHLORIDE CRYS ER 20 MEQ PO TBCR
20.0000 meq | EXTENDED_RELEASE_TABLET | Freq: Once | ORAL | Status: AC
  Administered 2018-08-24: 20 meq via ORAL
  Filled 2018-08-24: qty 1

## 2018-08-24 MED ORDER — PHYTONADIONE 5 MG PO TABS
2.5000 mg | ORAL_TABLET | Freq: Once | ORAL | Status: AC
  Administered 2018-08-24: 2.5 mg via ORAL
  Filled 2018-08-24: qty 1

## 2018-08-24 MED ORDER — ENSURE ENLIVE PO LIQD
237.0000 mL | Freq: Two times a day (BID) | ORAL | Status: DC
  Administered 2018-08-26 – 2018-08-27 (×3): 237 mL via ORAL

## 2018-08-24 NOTE — Plan of Care (Signed)
  Problem: Pain Managment: Goal: General experience of comfort will improve Outcome: Progressing   Problem: Safety: Goal: Ability to remain free from injury will improve Outcome: Progressing   Problem: Skin Integrity: Goal: Risk for impaired skin integrity will decrease Outcome: Progressing   

## 2018-08-24 NOTE — Consult Note (Signed)
Chief Complaint: Patient was seen in consultation today for liver mass biopsy.  Referring Physician(s): Dr. Oretha Milch  Supervising Physician: Markus Daft  Patient Status: [facility] - In-pt  History of Present Illness: Jo Hughes is a 78 y.o. female with a past medical history significant for anxiety, glaucoma, macular degeneration, OSA, hypothyroidism, HLD, CAD, a.fib on coumadin and s/p ablation 07/27/18, h/o endocarditis, h/o mitral and tricuspid valve repair and CHF who presented to [facility] ED on 08/23/18 with complaints of chest pain, bilateral lower back pain and urinary frequency since undergoing ablation on 1/14. CXR was performed which showed that the posterior left fourth rib was diffusely eroded, raising concern for a malignant process. Follow up CT chest/abdomen/pelvis with contrast was performed which showed numerous lytic lesions of the skeleton including the pelvis and bilateral proximal femurs, a superior endplate fracture associated with a lesion of the T11 vertebral body and a large, hypodense mass lesion of the central inferior right lobe of the liver with numerous additional small hypodense lesions. Overall, findings are consistent with advanced metastatic disease. She has been admitted for pain management. Request has been made to IR for liver mass biopsy.  Patient reports she is still having pain although it is better controlled, the pain is mostly in her low back and bilateral chest and is worsened with movement or coughing. She denies any other complaints. She states she was told that she would be having a biopsy today but she is not sure of what. She states she was not aware of the mass in her liver although she states "I was on so much pain medicine I probably forgot, they have told me a lot of things lately."   Past Medical History:  Diagnosis Date  . Anxiety   . Atrial fibrillation ([place])    persistent  . CAD (coronary artery disease)   . Chronic diastolic CHF  (congestive heart failure) ([place])   . 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 Glaucoma   . H/O mitral valve repair 2005   [place].  . H/O tricuspid valve repair   . Hx of acquired endocarditis 1970; 1986   Prior to mitral valve repair   . Hyperlipidemia    cardiac cath clean coronary arties in the past.  . Hypothyroid   . Insomnia 12/11/2015  . Kidney stone 10/2013   "pass it"  . Long term (current) use of anticoagulants    No bleeding  . Macular degeneration    early , [place]  . OSA (obstructive sleep apnea) 08/14/2015   Mild with AHI 7/hr  . Symptomatic bradycardia    s/p PPM  . Thyroid nodule     Past Surgical History:  Procedure Laterality Date  . ABDOMINAL HYSTERECTOMY  1986  . ATRIAL FIBRILLATION ABLATION N/A 11/08/2013   Procedure: ATRIAL FIBRILLATION ABLATION;  Surgeon: Coralyn Mark, MD;  Location: [place] CATH LAB;  Service: Cardiovascular;  Laterality: N/A;  . ATRIAL FIBRILLATION ABLATION N/A 07/27/2018   Procedure: ATRIAL FIBRILLATION ABLATION;  Surgeon: Thompson Grayer, MD;  Location: [place] CV LAB;  Service: Cardiovascular;  Laterality: N/A;  . BACK SURGERY    . BREAST BIOPSY Left X 2   "benign"  . CARDIAC CATHETERIZATION  X 3  . CARDIOVERSION  ~ 2011; 09/18/2011   ?; Procedure: CARDIOVERSION;  Surgeon: Sinclair Grooms, MD;  Location: [place];  Service: Cardiovascular;  Laterality: N/A;  . CARDIOVERSION N/A 04/29/2013  Procedure: CARDIOVERSION;  Surgeon: Sinclair Grooms, MD;  Location: [place];  Service: Cardiovascular;  Laterality: N/A;  . CATARACT EXTRACTION W/ INTRAOCULAR LENS  IMPLANT, BILATERAL  ~ 2011  . DILATION AND CURETTAGE OF UTERUS    . INSERT / REPLACE / REMOVE PACEMAKER  01/13/2012   MDT Adapta L implanted by Dr Rayann Heman  . KNEE CARTILAGE SURGERY Left 02/2009   scope; Dr. Berenice Primas  . LUMBAR LAMINECTOMY  1997  . MITRAL VALVE REPAIR  05/2004   [facility]  . PERMANENT PACEMAKER INSERTION N/A  01/13/2012   Procedure: PERMANENT PACEMAKER INSERTION;  Surgeon: Thompson Grayer, MD;  Location[facility] CATH LAB;  Service: Cardiovascular;  Laterality: N/A;  . TEE WITHOUT CARDIOVERSION N/A 11/07/2013   Procedure: TRANSESOPHAGEAL ECHOCARDIOGRAM (TEE);  Surgeon: Pixie Casino, MD;  Location: [facility] ENDOSCOPY;  Service: Cardiovascular;  Laterality: N/A;  . TONSILLECTOMY  1948  . TRICUSPID VALVE SURGERY  05/2004   "repair; [facility]"    Allergies: Citalopram hydrobromide; Trazodone and nefazodone; and Liver  Medications: Prior to Admission medications   Medication Sig Start Date End Date Taking? Authorizing Provider  ALPRAZolam Duanne Moron) 0.5 MG tablet Take 0.5 mg by mouth 2 (two) times daily as needed for anxiety.    Yes [provider]  amoxicillin (AMOXIL) 500 MG capsule Take 2,000 mg by mouth See admin instructions. Take four 4 capsules (2000 mg) by mouth one (1) hour prior to dental procedures. 08/22/14  Yes [provider]  colchicine 0.6 MG tablet Take 1 tablet (0.6 mg total) by mouth 2 (two) times daily. 08/13/18 08/13/19 Yes Sherran Needs, NP  cycloSPORINE (RESTASIS) 0.05 % ophthalmic emulsion Place 1 drop into both eyes 2 (two) times daily.    Yes [provider]  dofetilide (TIKOSYN) 125 MCG capsule Take 1 capsule by mouth twice daily along with a 250 mcg capsule Patient taking differently: Take 125 mcg by mouth 2 (two) times daily.  11/11/17  Yes Allred, Jeneen Rinks, MD  dofetilide (TIKOSYN) 250 MCG capsule Take 1 capsule by mouth twice daily along with a 125 mcg capsule Patient taking differently: Take 250 mcg by mouth 2 (two) times daily.  11/11/17  Yes Allred, Jeneen Rinks, MD  dorzolamide-timolol (COSOPT) 22.3-6.8 MG/ML ophthalmic solution Place 1 drop into both eyes 2 (two) times daily as needed (for dryness).    Yes [provider]  furosemide (LASIX) 80 MG tablet Take 80 mg by mouth every evening.  10/29/14  Yes [provider]  HYDROcodone-acetaminophen  (NORCO/VICODIN) 5-325 MG per tablet Take 1 tablet by mouth every 6 (six) hours as needed for moderate pain or severe pain.    Yes [provider]  latanoprost (XALATAN) 0.005 % ophthalmic solution Place 1 drop into both eyes at bedtime.    Yes [provider]  metoprolol succinate (TOPROL-XL) 25 MG 24 hr tablet TAKE 2 TABLETS BY MOUTH ONCE DAILY. Patient taking differently: Take 50 mg by mouth daily.  08/18/18  Yes Belva Crome, MD  Multiple Vitamins-Minerals (ICAPS) CAPS Take 1 capsule by mouth 2 (two) times daily.    Yes [provider]  naproxen sodium (ALEVE) 220 MG tablet Take 220-440 mg by mouth 2 (two) times daily as needed (for headaches).   Yes [provider]  Omega-3 Fatty Acids (FISH OIL) 1000 MG CAPS Take 1,000 mg by mouth 2 (two) times daily.    Yes [provider]  pantoprazole (PROTONIX) 40 MG tablet Take 1 tablet (40 mg total) by mouth daily.  07/28/18 09/11/18 Yes Baldwin Jamaica, PA-C  PARoxetine (PAXIL) 20 MG tablet Take 1 tablet (20 mg total) by mouth daily. 06/14/15  Yes Allred, Jeneen Rinks, MD  potassium chloride SA (K-DUR,KLOR-CON) 20 MEQ tablet TAKE 1 TABLET BY MOUTH TWICE DAILY. Patient taking differently: Take 20 mEq by mouth 2 (two) times daily.  09/04/17  Yes Allred, Jeneen Rinks, MD  rosuvastatin (CRESTOR) 40 MG tablet Take 40 mg by mouth every evening.    Yes [provider]  SYNTHROID 75 MCG tablet Take 75 mcg by mouth daily before breakfast. 05/17/18  Yes [provider]  traMADol (ULTRAM) 50 MG tablet Take 50 mg by mouth every 12 (twelve) hours as needed for moderate pain.    Yes [provider]  warfarin (COUMADIN) 5 MG tablet TAKE AS DIRECTED BY COUMADIN CLINIC. Patient taking differently: Take 2.5-5 mg by mouth See admin instructions. For the week of 08/22/2018: Take 2.5 mg by mouth in the morning on Sun/Tues/Thurs, 5 mg on Mon/Wed and RE-CHECK INR on Friday, 08/27/2018 05/10/18  Yes Belva Crome, MD    ZIOPTAN 0.0015 % SOLN Place 1 drop into both eyes at bedtime.  03/28/13  Yes [provider]  zolmitriptan (ZOMIG) 5 MG tablet Take 5 mg by mouth daily as needed for migraine.    Yes [provider]     Family History  Problem Relation Age of Onset  . Stroke Father   . Heart attack Father 55  . Heart disease Father   . Hyperlipidemia Father   . Hypertension Father   . Macular degeneration Mother   . Transient ischemic attack Mother        multiple  . Varicose Veins Mother   . Hypertension Brother     Social History   Socioeconomic History  . Marital status: Legally Separated    Spouse name: Not on file  . Number of children: Not on file  . Years of education: Not on file  . Highest education level: Not on file  Occupational History  . Not on file  Social Needs  . Financial resource strain: Not on file  . Food insecurity:    Worry: Not on file    Inability: Not on file  . Transportation needs:    Medical: Not on file    Non-medical: Not on file  Tobacco Use  . Smoking status: Former Smoker    Packs/day: 1.00    Years: 24.00    Pack years: 24.00    Types: Cigarettes    Last attempt to quit: 07/15/1987    Years since quitting: 31.1  . Smokeless tobacco: Never Used  Substance and Sexual Activity  . Alcohol use: Yes    Comment: 07/27/2018 "probably 2 glasses of wine/month  . Drug use: No  . Sexual activity: Not Currently  Lifestyle  . Physical activity:    Days per week: Not on file    Minutes per session: Not on file  . Stress: Not on file  Relationships  . Social connections:    Talks on phone: Not on file    Gets together: Not on file    Attends religious service: Not on file    Active member of club or organization: Not on file    Attends meetings of clubs or organizations: Not on file    Relationship status: Not on file  Other Topics Concern  . Not on file  Social History Narrative   Lives in [place] with spouse.  No children.  Retired Education officer, museum.     Review of Systems: A 12 point ROS discussed and pertinent positives are indicated in the HPI above.  All other systems are negative.  Review of Systems  Constitutional: Negative for appetite change, chills, fever and unexpected weight change.  Respiratory: Negative for cough and shortness of breath.   Cardiovascular: Positive for chest pain (bilateral; chronic since ablation). Negative for palpitations and leg swelling.  Gastrointestinal: Negative for abdominal pain, diarrhea, nausea and vomiting.  Genitourinary: Negative for dysuria and hematuria.  Musculoskeletal: Positive for back pain.  Skin: Negative for color change.  Neurological: Negative for dizziness, syncope, weakness and headaches.  Hematological: Bruises/bleeds easily (on coumadin).  Psychiatric/Behavioral: Negative for confusion.    Vital Signs: BP (!) 114/94 (BP Location: Right Arm)   Pulse 82   Temp 98.3 F (36.8 C) (Oral)   Resp 16   Ht 5\' 4"  (1.626 m)   Wt 122 lb 2.2 oz (55.4 kg)   SpO2 97%   BMI 20.96 kg/m   Physical Exam Vitals signs and nursing note reviewed.  Constitutional:      General: She is not in acute distress. HENT:     Head: Normocephalic.  Eyes:     General: No scleral icterus. Cardiovascular:     Rate and Rhythm: Normal rate and regular rhythm.  Pulmonary:     Effort: Pulmonary effort is normal.     Breath sounds: Normal breath sounds.  Abdominal:     General: There is no distension.     Palpations: Abdomen is soft.     Tenderness: There is no abdominal tenderness.  Skin:    General: Skin is warm and dry.     Coloration: Skin is not jaundiced.  Neurological:     Mental Status: She is alert and oriented to person, place, and time.  Psychiatric:        Mood and Affect: Mood normal.        Behavior: Behavior normal.        Thought Content: Thought content normal.        Judgment: Judgment normal.      MD Evaluation Airway: WNL Heart: WNL Abdomen:  WNL Chest/ Lungs: WNL ASA  Classification: 3 Mallampati/Airway Score: Two   Imaging: Dg Chest 2 View  Result Date: 08/23/2018 CLINICAL DATA:  Chest and back pain EXAM: CHEST - 2 VIEW COMPARISON:  08/31/2013 FINDINGS: Normal heart size. Prior median sternotomy. Dual-chamber pacer leads in stable position. Trace pleural fluid. No pulmonary edema or air bronchogram. No pneumothorax. Posterior left fourth rib appears to be newly eroded. This area was not covered on a January 2020 CT. IMPRESSION: The posterior left fourth rib is diffusely eroded, concerning for an aggressive/malignant process. Recommend chest CT with contrast if possible. Electronically Signed   By: Monte Fantasia M.D.   On: 08/23/2018 07:19   Ct Chest W Contrast  Result Date: 08/23/2018 CLINICAL DATA:  Recent atrial ablation, persistent back and chest discomfort, abnormal chest radiograph with left fourth rib lesion EXAM: CT CHEST, ABDOMEN, AND PELVIS WITH CONTRAST TECHNIQUE: Multidetector CT imaging of the chest, abdomen and pelvis was performed following the standard protocol during bolus administration of intravenous contrast. CONTRAST:  167mL OMNIPAQUE IOHEXOL 300 MG/ML  SOLN COMPARISON:  Same day chest radiograph, CT abdomen pelvis, 09/01/2013 FINDINGS: CT CHEST FINDINGS Cardiovascular: Cardiomegaly with left chest multi lead pacer defibrillator and evidence of atrial ablation. No pericardial effusion. Mediastinum/Nodes: No enlarged mediastinal, hilar, or axillary lymph nodes. Thyroid gland, trachea,  and esophagus demonstrate no significant findings. Lungs/Pleura: Lungs are clear.  Trace pleural effusion. Musculoskeletal: There are destructive, expansile lesions of the left fourth and fifth ribs, in keeping with findings of prior radiograph. There are numerous lytic lesions of the thoracic spine and a superior endplate fracture associated with a lytic lesion of the T11 vertebral body (series 7, image 85). CT ABDOMEN PELVIS FINDINGS  Hepatobiliary: There is a large, hypodense mass lesion of the central inferior right lobe of the liver, centered in segment V, with numerous additional small hypodense lesions. Tiny gallstones in the gallbladder. Pancreas: Unremarkable. No pancreatic ductal dilatation or surrounding inflammatory changes. Spleen: Normal in size without focal abnormality. Adrenals/Urinary Tract: Adrenal glands are unremarkable. Kidneys are normal, without renal calculi, focal lesion, or hydronephrosis. Bladder is unremarkable. Stomach/Bowel: Stomach is within normal limits. Appendix appears normal. No evidence of bowel wall thickening, distention, or inflammatory changes. Vascular/Lymphatic: No significant vascular findings are present. No enlarged abdominal or pelvic lymph nodes. Reproductive: No mass or other abnormality. Status post hysterectomy. Other: No abdominal wall hernia or abnormality. No abdominopelvic ascites. Musculoskeletal: Numerous lytic lesions of the lumbar spine pelvis, and proximal femurs. IMPRESSION: 1. There is a large, hypodense mass lesion of the central inferior right lobe of the liver, centered in segment V, with numerous additional small hypodense lesions. 2. Numerous lytic lesions of the included skeleton, particularly including left rib lesions corresponding to radiographic findings, a superior endplate fracture associated with a lesion of the T11 vertebral body and lesions involving the pelvis and bilateral proximal femurs. 3. 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 4.  Other chronic and incidental findings as detailed above. Electronically Signed   By: Eddie Candle M.D.   On: 08/23/2018 08:47   Ct Abdomen Pelvis W Contrast  Result Date: 08/23/2018 CLINICAL DATA:  Recent atrial ablation, persistent back and chest discomfort, abnormal chest radiograph with left fourth rib lesion EXAM: CT CHEST,  ABDOMEN, AND PELVIS WITH CONTRAST TECHNIQUE: Multidetector CT imaging of the chest, abdomen and pelvis was performed following the standard protocol during bolus administration of intravenous contrast. CONTRAST:  124mL OMNIPAQUE IOHEXOL 300 MG/ML  SOLN COMPARISON:  Same day chest radiograph, CT abdomen pelvis, 09/01/2013 FINDINGS: CT CHEST FINDINGS Cardiovascular: Cardiomegaly with left chest multi lead pacer defibrillator and evidence of atrial ablation. No pericardial effusion. Mediastinum/Nodes: No enlarged mediastinal, hilar, or axillary lymph nodes. Thyroid gland, trachea, and esophagus demonstrate no significant findings. Lungs/Pleura: Lungs are clear.  Trace pleural effusion. Musculoskeletal: There are destructive, expansile lesions of the left fourth and fifth ribs, in keeping with findings of prior radiograph. There are numerous lytic lesions of the thoracic spine and a superior endplate fracture associated with a lytic lesion of the T11 vertebral body (series 7, image 85). CT ABDOMEN PELVIS FINDINGS Hepatobiliary: There is a large, hypodense mass lesion of the central inferior right lobe of the liver, centered in segment V, with numerous additional small hypodense lesions. Tiny gallstones in the gallbladder. Pancreas: Unremarkable. No pancreatic ductal dilatation or surrounding inflammatory changes. Spleen: Normal in size without focal abnormality. Adrenals/Urinary Tract: Adrenal glands are unremarkable. Kidneys are normal, without renal calculi, focal lesion, or hydronephrosis. Bladder is unremarkable. Stomach/Bowel: Stomach is within normal limits. Appendix appears normal. No evidence of bowel wall thickening, distention, or inflammatory changes. Vascular/Lymphatic: No significant vascular findings are present. No enlarged abdominal or pelvic lymph nodes. Reproductive: No mass or other abnormality. Status post hysterectomy. Other: No abdominal wall  hernia or abnormality. No abdominopelvic ascites.  Musculoskeletal: Numerous lytic lesions of the lumbar spine pelvis, and proximal femurs. IMPRESSION: 1. There is a large, hypodense mass lesion of the central inferior right lobe of the liver, centered in segment V, with numerous additional small hypodense lesions. 2. Numerous lytic lesions of the included skeleton, particularly including left rib lesions corresponding to radiographic findings, a superior endplate fracture associated with a lesion of the T11 vertebral body and lesions involving the pelvis and bilateral proximal femurs. 3. 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 4.  Other chronic and incidental findings as detailed above. Electronically Signed   By: Eddie Candle M.D.   On: 08/23/2018 08:47    Labs:  CBC: Recent Labs    06/29/18 1312 08/16/18 1427 08/23/18 0626  WBC 7.9 9.3 9.1  HGB 14.6 13.1 15.1*  HCT 43.1 40.0 45.6  PLT 200 201 193    COAGS: Recent Labs    08/17/18 1322 08/17/18 1334 08/23/18 0645 08/24/18 0346  INR 6.8* 7.8* 2.58 2.40    BMP: Recent Labs    07/28/18 0346 08/16/18 1427 08/23/18 0626 08/24/18 0923  NA 140 134* 138 138  K 5.1 4.1 3.8 3.8  CL 108 101 99 105  CO2 25 23 23 23   GLUCOSE 121* 108* 146* 113*  BUN 11 19 21 18   CALCIUM 9.9 10.6* 11.9* 11.3*  CREATININE 0.89 1.06* 1.12* 1.03*  GFRNONAA >60 51* 48* 53*  GFRAA >60 59* 55* >60    LIVER FUNCTION TESTS: Recent Labs    08/23/18 0645 08/24/18 0923  BILITOT 1.0 1.2  AST 103* 78*  ALT 88* 68*  ALKPHOS 388* 352*  PROT 7.6 6.0*  ALBUMIN 3.5 2.8*    TUMOR MARKERS: No results for input(s): AFPTM, CEA, CA199, CHROMGRNA in the last 8760 hours.  Assessment and Plan:  Patient with newly discovered liver mass/metastatic disease - request for biopsy of liver mass has been approved by Dr. Anselm Pancoast, however INR 2.40 today. INR should ideally be <1.5 for this procedure given high risk  of bleeding. Will repeat INR in AM - if still >2.0 will discuss with IR MD. Will restart diet today and place new orders for NPO after midnight tonight.   Risks and benefits of liver mass biopsy was discussed with the patient and/or patient's family including, but not limited to bleeding, infection, damage to adjacent structures or low yield requiring additional tests.  All of the questions were answered and there is agreement to proceed.  Consent signed and in chart.  Thank you for this interesting consult.  I greatly enjoyed meeting Jo Hughes and look forward to participating in their care.  A copy of this report was sent to the requesting provider on this date.  Electronically Signed: Joaquim Nam, PA-C 08/24/2018, 11:31 AM   I spent a total of 40 Minutes  in face to face in clinical consultation, greater than 50% of which was counseling/coordinating care for liver mas biopsy.

## 2018-08-24 NOTE — Progress Notes (Addendum)
[facility] PROGRESS NOTE  [name]:[id number] DOB: Jan 30, 1941 DOA: 08/23/2018 PCP: Lavone Orn, MD  Assessment/Plan: 1. Large right liver mass and multiple lytic lesions of skeleton, concerning for primary liver malignancy with metastatic disease.  We need to pursue biopsy, most likely by IR but currently awaiting reversal of INR (most recently 2.4)  2. Elevated LFTs,.  AST and ALT elevated.  Alk phos returned to normal.  Normal bilirubin.  Likely related to above, will closely monitor with daily CMP  3. Acute on chronic hypercalcemia.  Has had quite elevated calcium (baseline 10.5-10.8 for several years back to 2015 per chart review).  Given concern for malignancy the acute worsening could likely be secondary to that.  Currently asymptomatic, will start timed IV fluids given CHF( stable and euvolemic currently) and closely monitor  4. Persistent atrial fibrillation, rate controlled.  Status post ablation in January that was unsuccessful.  Continue Tikosyn, monitor electrolytes and replete as needed, on IV heparin (cardiology recommends continuing anticoagulation given recent ablation).  Status post vitamin K 2.5 p.o., monitor INR.  Continue Toprol-XL  5. Diffuse body aches (back and chest).  Likely pain related to no skeletal lesions as mentioned above.  Pain control with PRN Tylenol and hydrocodone/acetaminophen, IV antiemetics and Ultram.  6. Chronic CHF with preserved EF, euvolemic on exam.  Continue home Lasix regimen.  Daily weights.  7. Hypothyroidism, stable.  Continue home Synthroid  8. GERD, stable continue Protonix  9. Hyperlipidemia, stable continue Crestor  10. Cognitive impairment?.  Patient seemed a bit disoriented in terms of context in speaking with her this morning she had to be reminded that she was told that she had a liver mass with concern for cancer last night.  In reading the radiologist note she again was unclear if she was told that there was  concern for liver mass.  May have some mild cognitive impairment or undiagnosed dementia?.  Delirium precautions in place. Determine baseline from family, if change in status may warrant head imaging?  Code Status: Full code  family Communication: No family at bedside  disposition Plan: Pain control while waiting for INR to become subtherapeutic (status post vitamin K) for eventual biopsy by IR of new liver mass  Consultants:  IR  Procedures:  None  Antibiotics:  None  HPI/Subjective:  Jo Hughes is a 78 y.o. year old female with medical history significant for bradycardia status post pacemaker placement, chronic persistent atrial fibrillation (status post ablation 07/27/2018) on Tikosyn and warfarin, CHF with preserved EF, CAD who presented on 08/23/2018 with several days of diffuse body pain and was found to have eroded left rib concerning for malignancy on chest x-ray prompting CT chest and abdomen significant for large mass in right lobe of liver and multiple skeletal lytic lesions concerning for metastatic disease of possible HCC or cholangiocarcinoma origin.  Plan for patient to undergo biopsy by IR once her INR is subtherapeutic.  This a.m. she says she still is having pain all over but it is much more controlled.   Objective: Vitals:   08/24/18 0957 08/24/18 1039  BP: 126/82 (!) 114/94  Pulse: 86 82  Resp: 16   Temp: 98.3 F (36.8 C)   SpO2: 97%    No intake or output data in the 24 hours ending 08/24/18 1308 Filed Weights   08/23/18 0628 08/23/18 1730  Weight: 58.5 kg 55.4 kg    Exam:   General: Lying in bed, no distress  Cardiovascular: Irregularly irregular rhythm,  no appreciable murmurs rubs or gallops, no edema  Respiratory: Normal respiratory effort on room air, no crackles or wheezing  Abdomen: Soft nondistended, nontender, normal bowel sounds  Musculoskeletal: Normal range of motion  Skin no skin rashes or lesions  Neurologic alert, oriented to  place and time, no focal deficits.  But forgetful had to be reminded of context (forgot that she was told that she has a liver mass)  Data Reviewed: Basic Metabolic Panel: Recent Labs  Lab 08/23/18 0626 08/24/18 0923  NA 138 138  K 3.8 3.8  CL 99 105  CO2 23 23  GLUCOSE 146* 113*  BUN 21 18  CREATININE 1.12* 1.03*  CALCIUM 11.9* 11.3*   Liver Function Tests: Recent Labs  Lab 08/23/18 0645 08/24/18 0923  AST 103* 78*  ALT 88* 68*  ALKPHOS 388* 352*  BILITOT 1.0 1.2  PROT 7.6 6.0*  ALBUMIN 3.5 2.8*   Recent Labs  Lab 08/23/18 0645  LIPASE 40   No results for input(s): AMMONIA in the last 168 hours. CBC: Recent Labs  Lab 08/23/18 0626  WBC 9.1  HGB 15.1*  HCT 45.6  MCV 82.5  PLT 193   Cardiac Enzymes: No results for input(s): CKTOTAL, CKMB, CKMBINDEX, TROPONINI in the last 168 hours. BNP (last 3 results) No results for input(s): BNP in the last 8760 hours.  ProBNP (last 3 results) No results for input(s): PROBNP in the last 8760 hours.  CBG: No results for input(s): GLUCAP in the last 168 hours.  Recent Results (from the past 240 hour(s))  Urine culture     Status: Abnormal   Collection Time: 08/23/18 10:32 AM  Result Value Ref Range Status   Specimen Description URINE, RANDOM  Final   Special Requests NONE  Final   Culture (A)  Final    60,000 COLONIES/mL GROUP B STREP(S.AGALACTIAE)ISOLATED TESTING AGAINST S. AGALACTIAE NOT ROUTINELY PERFORMED DUE TO PREDICTABILITY OF AMP/PEN/VAN SUSCEPTIBILITY. Performed at [facility], [address] [place], [place] [address]    Report Status 08/24/2018 FINAL  Final     Studies: Dg Chest 2 View  Result Date: 08/23/2018 CLINICAL DATA:  Chest and back pain EXAM: CHEST - 2 VIEW COMPARISON:  08/31/2013 FINDINGS: Normal heart size. Prior median sternotomy. Dual-chamber pacer leads in stable position. Trace pleural fluid. No pulmonary edema or air bronchogram. No pneumothorax. Posterior left fourth rib  appears to be newly eroded. This area was not covered on a January 2020 CT. IMPRESSION: The posterior left fourth rib is diffusely eroded, concerning for an aggressive/malignant process. Recommend chest CT with contrast if possible. Electronically Signed   By: Jonathon  Watts M.D.   On: 08/23/2018 07:19   Ct Chest W Contrast  Result Date: 08/23/2018 CLINICAL DATA:  Recent atrial ablation, persistent back and chest discomfort, abnormal chest radiograph with left fourth rib lesion EXAM: CT CHEST, ABDOMEN, AND PELVIS WITH CONTRAST TECHNIQUE: Multidetector CT imaging of the chest, abdomen and pelvis was performed following the standard protocol during bolus administration of intravenous contrast. CONTRAST:  100mL OMNIPAQUE IOHEXOL 300 MG/ML  SOLN COMPARISON:  Same day chest radiograph, CT abdomen pelvis, 09/01/2013 FINDINGS: CT CHEST FINDINGS Cardiovascular: Cardiomegaly with left chest multi lead pacer defibrillator and evidence of atrial ablation. No pericardial effusion. Mediastinum/Nodes: No enlarged mediastinal, hilar, or axillary lymph nodes. Thyroid gland, trachea, and esophagus demonstrate no significant findings. Lungs/Pleura: Lungs are clear.  Trace pleural effusion. Musculoskeletal: There are destructive, expansile lesions of the left fourth and fifth ribs, in keeping   with findings of prior radiograph. There are numerous lytic lesions of the thoracic spine and a superior endplate fracture associated with a lytic lesion of the T11 vertebral body (series 7, image 85). CT ABDOMEN PELVIS FINDINGS Hepatobiliary: There is a large, hypodense mass lesion of the central inferior right lobe of the liver, centered in segment V, with numerous additional small hypodense lesions. Tiny gallstones in the gallbladder. Pancreas: Unremarkable. No pancreatic ductal dilatation or surrounding inflammatory changes. Spleen: Normal in size without focal abnormality. Adrenals/Urinary Tract: Adrenal glands are unremarkable. Kidneys  are normal, without renal calculi, focal lesion, or hydronephrosis. Bladder is unremarkable. Stomach/Bowel: Stomach is within normal limits. Appendix appears normal. No evidence of bowel wall thickening, distention, or inflammatory changes. Vascular/Lymphatic: No significant vascular findings are present. No enlarged abdominal or pelvic lymph nodes. Reproductive: No mass or other abnormality. Status post hysterectomy. Other: No abdominal wall hernia or abnormality. No abdominopelvic ascites. Musculoskeletal: Numerous lytic lesions of the lumbar spine pelvis, and proximal femurs. IMPRESSION: 1. There is a large, hypodense mass lesion of the central inferior right lobe of the liver, centered in segment V, with numerous additional small hypodense lesions. 2. Numerous lytic lesions of the included skeleton, particularly including left rib lesions corresponding to radiographic findings, a superior endplate fracture associated with a lesion of the T11 vertebral body and lesions involving the pelvis and bilateral proximal femurs. 3. 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 4.  Other chronic and incidental findings as detailed above. Electronically Signed   By: Eddie Candle M.D.   On: 08/23/2018 08:47   Ct Abdomen Pelvis W Contrast  Result Date: 08/23/2018 CLINICAL DATA:  Recent atrial ablation, persistent back and chest discomfort, abnormal chest radiograph with left fourth rib lesion EXAM: CT CHEST, ABDOMEN, AND PELVIS WITH CONTRAST TECHNIQUE: Multidetector CT imaging of the chest, abdomen and pelvis was performed following the standard protocol during bolus administration of intravenous contrast. CONTRAST:  180m OMNIPAQUE IOHEXOL 300 MG/ML  SOLN COMPARISON:  Same day chest radiograph, CT abdomen pelvis, 09/01/2013 FINDINGS: CT CHEST FINDINGS Cardiovascular: Cardiomegaly with left chest multi lead pacer  defibrillator and evidence of atrial ablation. No pericardial effusion. Mediastinum/Nodes: No enlarged mediastinal, hilar, or axillary lymph nodes. Thyroid gland, trachea, and esophagus demonstrate no significant findings. Lungs/Pleura: Lungs are clear.  Trace pleural effusion. Musculoskeletal: There are destructive, expansile lesions of the left fourth and fifth ribs, in keeping with findings of prior radiograph. There are numerous lytic lesions of the thoracic spine and a superior endplate fracture associated with a lytic lesion of the T11 vertebral body (series 7, image 85). CT ABDOMEN PELVIS FINDINGS Hepatobiliary: There is a large, hypodense mass lesion of the central inferior right lobe of the liver, centered in segment V, with numerous additional small hypodense lesions. Tiny gallstones in the gallbladder. Pancreas: Unremarkable. No pancreatic ductal dilatation or surrounding inflammatory changes. Spleen: Normal in size without focal abnormality. Adrenals/Urinary Tract: Adrenal glands are unremarkable. Kidneys are normal, without renal calculi, focal lesion, or hydronephrosis. Bladder is unremarkable. Stomach/Bowel: Stomach is within normal limits. Appendix appears normal. No evidence of bowel wall thickening, distention, or inflammatory changes. Vascular/Lymphatic: No significant vascular findings are present. No enlarged abdominal or pelvic lymph nodes. Reproductive: No mass or other abnormality. Status post hysterectomy. Other: No abdominal wall hernia or abnormality. No abdominopelvic ascites. Musculoskeletal: Numerous lytic lesions of the lumbar spine pelvis, and proximal femurs. IMPRESSION: 1. There is a large, hypodense mass lesion of the  central inferior right lobe of the liver, centered in segment V, with numerous additional small hypodense lesions. 2. Numerous lytic lesions of the included skeleton, particularly including left rib lesions corresponding to radiographic findings, a superior endplate  fracture associated with a lesion of the T11 vertebral body and lesions involving the pelvis and bilateral proximal femurs. 3. 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 4.  Other chronic and incidental findings as detailed above. Electronically Signed   By: Alex  Bibbey M.D.   On: 08/23/2018 08:47    Scheduled Meds: . cycloSPORINE  1 drop Both Eyes BID  . dofetilide  375 mcg Oral BID  . dorzolamide-timolol  1 drop Both Eyes BID  . furosemide  80 mg Oral QPM  . latanoprost  1 drop Both Eyes QHS  . levothyroxine  75 mcg Oral QAC breakfast  . metoprolol succinate  50 mg Oral Daily  . multivitamin  1 tablet Oral BID  . pantoprazole  40 mg Oral Daily  . PARoxetine  20 mg Oral Daily  . potassium chloride SA  20 mEq Oral BID  . potassium chloride SA  20 mEq Oral Once  . 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             

## 2018-08-25 ENCOUNTER — Other Ambulatory Visit (HOSPITAL_COMMUNITY): Payer: Self-pay | Admitting: Internal Medicine

## 2018-08-25 ENCOUNTER — Other Ambulatory Visit (HOSPITAL_COMMUNITY): Payer: Self-pay | Admitting: Nurse Practitioner

## 2018-08-25 ENCOUNTER — Inpatient Hospital Stay (HOSPITAL_COMMUNITY): Payer: Medicare Other

## 2018-08-25 ENCOUNTER — Other Ambulatory Visit (HOSPITAL_COMMUNITY): Payer: Self-pay | Admitting: Pharmacist

## 2018-08-25 DIAGNOSIS — E440 Moderate protein-calorie malnutrition: Secondary | ICD-10-CM

## 2018-08-25 LAB — COMPREHENSIVE METABOLIC PANEL
ALT: 62 U/L — ABNORMAL HIGH (ref 0–44)
AST: 66 U/L — ABNORMAL HIGH (ref 15–41)
Albumin: 2.9 g/dL — ABNORMAL LOW (ref 3.5–5.0)
Alkaline Phosphatase: 334 U/L — ABNORMAL HIGH (ref 38–126)
Anion gap: 13 (ref 5–15)
BUN: 17 mg/dL (ref 8–23)
CO2: 24 mmol/L (ref 22–32)
Calcium: 11 mg/dL — ABNORMAL HIGH (ref 8.9–10.3)
Chloride: 104 mmol/L (ref 98–111)
Creatinine, Ser: 0.98 mg/dL (ref 0.44–1.00)
GFR calc Af Amer: >60 mL/min (ref >60)
GFR calc non Af Amer: 56 mL/min — ABNORMAL LOW (ref >60)
Glucose, Bld: 112 mg/dL — ABNORMAL HIGH (ref 70–99)
Potassium: 3.6 mmol/L (ref 3.5–5.1)
Sodium: 141 mmol/L (ref 135–145)
Total Bilirubin: 1 mg/dL (ref 0.3–1.2)
Total Protein: 6.1 g/dL — ABNORMAL LOW (ref 6.5–8.1)

## 2018-08-25 LAB — CBC
HCT: 42.6 % (ref 36.0–46.0)
Hemoglobin: 13.9 g/dL (ref 12.0–15.0)
MCH: 26.2 pg (ref 26.0–34.0)
MCHC: 32.6 g/dL (ref 30.0–36.0)
MCV: 80.4 fL (ref 80.0–100.0)
Platelets: 175 10*3/uL (ref 150–400)
RBC: 5.3 MIL/uL — ABNORMAL HIGH (ref 3.87–5.11)
RDW: 13.8 % (ref 11.5–15.5)
WBC: 9.4 10*3/uL (ref 4.0–10.5)
nRBC: 0 % (ref 0.0–0.2)

## 2018-08-25 LAB — PROTIME-INR
INR: 1.57
Prothrombin Time: 18.6 seconds — ABNORMAL HIGH (ref 11.4–15.2)

## 2018-08-25 LAB — MAGNESIUM: Magnesium: 1.9 mg/dL (ref 1.7–2.4)

## 2018-08-25 MED ORDER — ZOLEDRONIC ACID 4 MG/100ML IV SOLN
4.0000 mg | Freq: Once | INTRAVENOUS | Status: AC
  Filled 2018-08-25: qty 100

## 2018-08-25 MED ORDER — GELATIN ABSORBABLE 12-7 MM EX MISC
CUTANEOUS | Status: AC
  Filled 2018-08-25: qty 1

## 2018-08-25 MED ORDER — FENTANYL CITRATE (PF) 100 MCG/2ML IJ SOLN
INTRAMUSCULAR | Status: AC
  Filled 2018-08-25: qty 2

## 2018-08-25 MED ORDER — IOPAMIDOL (ISOVUE-300) INJECTION 61%
INTRAVENOUS | Status: AC
  Filled 2018-08-25: qty 50

## 2018-08-25 MED ORDER — LIDOCAINE HCL (PF) 1 % IJ SOLN
INTRAMUSCULAR | Status: AC
  Filled 2018-08-25: qty 30

## 2018-08-25 MED ORDER — MIDAZOLAM HCL 2 MG/2ML IJ SOLN
INTRAMUSCULAR | Status: AC
  Filled 2018-08-25: qty 2

## 2018-08-25 MED ORDER — ZOLEDRONIC ACID 4 MG/5ML IV CONC
4.0000 mg | Freq: Once | INTRAVENOUS | Status: AC
  Administered 2018-08-25: 4 mg via INTRAVENOUS
  Filled 2018-08-25 (×2): qty 5

## 2018-08-25 MED ORDER — HEPARIN BOLUS VIA INFUSION
2000.0000 [IU] | Freq: Once | INTRAVENOUS | Status: AC
  Administered 2018-08-25: 2000 [IU] via INTRAVENOUS
  Filled 2018-08-25: qty 2000

## 2018-08-25 MED ORDER — POLYETHYLENE GLYCOL 3350 17 G PO PACK
17.0000 g | PACK | Freq: Every day | ORAL | Status: DC
  Administered 2018-08-25 – 2018-08-27 (×3): 17 g via ORAL
  Filled 2018-08-25 (×3): qty 1

## 2018-08-25 MED ORDER — POTASSIUM CHLORIDE CRYS ER 20 MEQ PO TBCR: 20.0000 meq | EXTENDED_RELEASE_TABLET | Freq: Two times a day (BID) | ORAL | Status: DC

## 2018-08-25 MED ORDER — FENTANYL CITRATE (PF) 100 MCG/2ML IJ SOLN
INTRAMUSCULAR | Status: DC | PRN
  Administered 2018-08-25: 50 ug via INTRAVENOUS

## 2018-08-25 MED ORDER — HEPARIN (PORCINE) 25000 UT/250ML-% IV SOLN
750.0000 [IU]/h | INTRAVENOUS | Status: DC
  Administered 2018-08-25: 750 [IU]/h via INTRAVENOUS
  Filled 2018-08-25 (×3): qty 250

## 2018-08-25 MED ORDER — HEPARIN (PORCINE) 25000 UT/250ML-% IV SOLN
950.0000 [IU]/h | INTRAVENOUS | Status: DC
  Administered 2018-08-25: 750 [IU]/h via INTRAVENOUS
  Filled 2018-08-25: qty 250

## 2018-08-25 MED ORDER — IOHEXOL 300 MG/ML  SOLN
50.0000 mL | Freq: Once | INTRAMUSCULAR | Status: AC | PRN
  Administered 2018-08-25: 50 mL via INTRAVENOUS

## 2018-08-25 MED ORDER — MIDAZOLAM HCL 2 MG/2ML IJ SOLN
INTRAMUSCULAR | Status: DC | PRN
  Administered 2018-08-25: 0.5 mg via INTRAVENOUS
  Administered 2018-08-25: 1 mg via INTRAVENOUS

## 2018-08-25 MED ORDER — POTASSIUM CHLORIDE CRYS ER 20 MEQ PO TBCR
40.0000 meq | EXTENDED_RELEASE_TABLET | Freq: Two times a day (BID) | ORAL | Status: AC
  Administered 2018-08-25 (×2): 40 meq via ORAL
  Filled 2018-08-25 (×2): qty 2

## 2018-08-25 NOTE — Progress Notes (Signed)
Initial Nutrition Assessment  DOCUMENTATION CODES:   Non-severe (moderate) malnutrition in context of chronic illness  INTERVENTION:   -Ensure Enlive po BID, each supplement provides 350 kcal and 20 grams of protein -MVI with minerals daily  NUTRITION DIAGNOSIS:   Moderate Malnutrition related to chronic illness(bone cancer) as evidenced by energy intake < or equal to 75% for > or equal to 1 month, mild fat depletion, moderate fat depletion, mild muscle depletion, moderate muscle depletion, percent weight loss.  GOAL:   Patient will meet greater than or equal to 90% of their needs  MONITOR:   PO intake, Supplement acceptance, Labs, Weight trends, Skin, I & O's  REASON FOR ASSESSMENT:   Malnutrition Screening Tool    ASSESSMENT:   Jo Hughes is a 78 y.o. female with medical history significant of persistent atrial fibrillation status post ablation, currently on Tikosyn and Coumadin, mitral valve repair, chronic diastolic heart failure, coronary artery disease hypothyroidism, symptomatic bradycardia status post pacemaker who presents to the emergency room with ongoing pain in various part of the body, aggravated recently and present since 3 months.  According to the patient, she had a started these pains mostly on the back of the left chest wall, middle of the back, both groins and also on the hip bones.  This started about 3 months ago, she had atrial fibrillation ablation done last month.  She was also treated with colchicine with presumptive diagnosis of pericarditis after procedure.  Patient complains of pain occasionally at 8 out of 10, aggravated by movement, spasmodic.  Pt admitted with a-fib and metastatic bone cancer.   Case discussed with RN prior to visit, who reports pt is in a lot of pain, but able to provide history. Per RN, plan to potential liver mass biopsy tomorrow.   Spoke with pt at bedside, who reports a general decline in health over the past month. She  shares that pain is a limiting factor to her intake and mobility. Pt reports that she was trying to consume 2 meals per day, however, intake slowly decreased over the past month due to pt's inability to get up from her recliner and prepare food secondary to pain. Pt shares that her friends wound bring home cooked meals per her but it was often to difficult to even get up and plate food to put in the microwave. Pt shares her appetite still hasn't returned- she consumed asparagus, dinner roll, and tea for lunch this afternoon.   Pt endorses wt loss, but unsure of amount. Noted pt has experienced a 9.5% wt loss over the past 3 months, which is significant for time frame.   Discussed importance of good meal and supplement intake to promote healing. Pt amenable to Ensure supplements.  Labs reviewed.   NUTRITION - FOCUSED PHYSICAL EXAM:    Most Recent Value  Orbital Region  Mild depletion  Upper Arm Region  Moderate depletion  Thoracic and Lumbar Region  No depletion  Buccal Region  No depletion  Temple Region  Mild depletion  Clavicle Bone Region  Moderate depletion  Clavicle and Acromion Bone Region  Mild depletion  Scapular Bone Region  Mild depletion  Dorsal Hand  Moderate depletion  Patellar Region  Moderate depletion  Anterior Thigh Region  Moderate depletion  Posterior Calf Region  Moderate depletion  Edema (RD Assessment)  None  Hair  Reviewed  Eyes  Reviewed  Mouth  Reviewed  Skin  Reviewed  Nails  Reviewed  Diet Order:   Diet Order            Diet NPO time specified Except for: Sips with Meds  Diet effective midnight              EDUCATION NEEDS:   Education needs have been addressed  Skin:  Skin Assessment: Reviewed RN Assessment  Last BM:  08/23/18  Height:   Ht Readings from Last 1 Encounters:  08/23/18 5\' 4"  (1.626 m)    Weight:   Wt Readings from Last 1 Encounters:  08/23/18 55.4 kg    Ideal Body Weight:  54.5 kg  BMI:  Body mass index is  20.96 kg/m.  Estimated Nutritional Needs:   Kcal:  1700-1900  Protein:  85-100 grams  Fluid:  > 1.7 L    Jo Hughes A. Jo Hughes, RD, LDN, CDE Pager: [phone] After hours Pager: [phone]

## 2018-08-25 NOTE — Progress Notes (Signed)
Pt heparin bolus 2000 units given and drip pf 7.5 ml started.

## 2018-08-25 NOTE — Progress Notes (Signed)
ANTICOAGULATION CONSULT NOTE - Initial Consult  Pharmacy Consult for Heparin Indication: atrial fibrillation  Allergies  Allergen Reactions  . Citalopram Hydrobromide Other (See Comments)    Headaches and insomnia  . Trazodone And Nefazodone Other (See Comments)    Weakness  . Liver Nausea And Vomiting    Patient Measurements: Height: 5\' 4"  (162.6 cm) Weight: 122 lb 2.2 oz (55.4 kg) IBW/kg (Calculated) : 54.7 Heparin Dosing Weight: 55.4 kg  Vital Signs: Temp: 98 F (36.7 C) (02/12 0512) Temp Source: Oral (02/12 0512) BP: 125/84 (02/12 0512) Pulse Rate: 94 (02/12 0512)  Labs: Recent Labs    08/23/18 0626 08/23/18 0645 08/24/18 0346 08/24/18 0923 08/25/18 0206  HGB 15.1*  --   --   --  13.9  HCT 45.6  --   --   --  42.6  PLT 193  --   --   --  175  LABPROT  --  27.3* 25.8*  --  18.6*  INR  --  2.58 2.40  --  1.57  CREATININE 1.12*  --   --  1.03* 0.98    Estimated Creatinine Clearance: 42.2 mL/min (by C-G formula based on SCr of 0.98 mg/dL).   Medical History: Past Medical History:  Diagnosis Date  . Anxiety   . Atrial fibrillation ([place])    persistent  . CAD (coronary artery disease)   . Chronic diastolic CHF (congestive heart failure) ([place])   . 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 Glaucoma   . H/O mitral valve repair 2005   [place].  . H/O tricuspid valve repair   . Hx of acquired endocarditis 1970; 1986   Prior to mitral valve repair   . Hyperlipidemia    cardiac cath clean coronary arties in the past.  . Hypothyroid   . Insomnia 12/11/2015  . Kidney stone 10/2013   "pass it"  . Long term (current) use of anticoagulants    No bleeding  . Macular degeneration    early , [place]  . OSA (obstructive sleep apnea) 08/14/2015   Mild with AHI 7/hr  . Symptomatic bradycardia    s/p PPM  . Thyroid nodule    Assessment: CC/HPI: chest pin, back and hip pain  PMH: Afib, CHF, hypercalcemia, hypothyrid,  GERD, HLD, brady s/p PPM, anxiety, glaucoma, macular degeneration, OSA, CADh/o endocarditis, h/o mitral and tricuspid valve repair   Significant events:  -Large right liver mass and multiple lytic lesions of skeleton, concerning for primary liver malignancy with metastatic disease.     Anticoag: Afib, warfarin PTA for afib, INR 2.58 on admission INR 2.4>1.57 today. Hgb 15.1>13.9. Plts 175 - 2/11: Vit K  2.5mg  po x 1  Goal of Therapy:  Heparin level 0.3-0.7 units/ml Monitor platelets by anticoagulation protocol: Yes   Plan:  Heparin 2000 unit IV bolus Heparin infusion at 750 units/hr Check heparin level in 6-8 hrs Daily HL and CBC Check Magnesium-add on Replace K per protocol on Tikosyn (Kdur 40 BID today, then back to 42meq BID tomorrow) F/u plans for biopsy  Jackelynn Hosie S. Alford Highland, PharmD, BCPS Clinical Staff Pharmacist Eilene Ghazi Stillinger 08/25/2018,7:14 AM

## 2018-08-25 NOTE — Care Management Important Message (Signed)
Important Message  Patient Details  Name: Jo Hughes MRN: [id number] Date of Birth: 05-21-1941   Medicare Important Message Given:  Yes    Orbie Pyo 08/25/2018, 3:52 PM

## 2018-08-25 NOTE — Progress Notes (Signed)
This nurse assuming care, pt ambulated to bathroom with standby assist without difficulty.  Transport at bedside to transport to CT via wheelchair, IVF infusing.  [id number]

## 2018-08-25 NOTE — Progress Notes (Signed)
PROGRESS NOTE    Jo Hughes  IRC:[id number] DOB: 06-27-1941 DOA: 08/23/2018 PCP: Lavone Orn, MD   Brief Narrative: Patient is a 78 year old female with past medical history of bradycardia status post pacemaker placement, chronic persistent A. fib, status post ablation on 07/27/2018, CHF with preserved ejection fraction, coronary disease who presented from home with several days of diffuse body pain.  She was found to have hypercalcemia.  Imaging showed elevated left ribs concerning for malignancy, large mass in the right lobe of the liver, multiple skeletal lytic lesions concerning for metastatic disease of possible hepatocellular carcinoma cholangiocarcinoma.  Planning for liver biopsy by IR once INR is less than 1.5.  INR 1.57 today.  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  Planning for liver biopsy by IR. I discussed with on call oncologist Dr. Walden Field today and provided information.  Elevated LFTs: Secondary to primary versus metastatic disease of the liver.  Continue to monitor.  Patient complains of diffuse generalized abdominal pain.  Persistent atrial fibrillation: Currently rate is controlled.  Status post ablation in January.  On Tikosyn.  On warfarin at home.  She had to be given vitamin K to lower the INR for biopsy.  Currently on heparin drip for anticoagulation.  She follows with cardiology as an outpatient.  She  also has a pacemaker.  Acute on chronic hypercalcemia: Elevated baseline calcium at 10.5-10.8.  Today calcium level is 11.  Continue IV fluids.  Generalized body aches/generalized weakness: We will request for physical therapy evaluation after biopsy.  Chronic congestive heart failure with preserved ejection fraction: Currently euvolemic.  Continue home Lasix regimen.  It will also help with her hypercalcemia.  Hypothyroidism: Currently stable.  Continue Synthyroid  GERD/hyperlipidemia: Continue Protonix/Crestor  Episodes of confusion: Currently alert and oriented.  She might need brain imagings for metastatic work-up.  She has pacemaker so she is not a candidate for MRI of the brain.Will do the brain with contrast.       Nutrition Problem: Moderate Malnutrition Etiology: chronic illness(bone cancer)      DVT prophylaxis: Heparin IV Code Status: Full code Family Communication: None present at the bedside Disposition Plan: Likely home after biopsy.  Needs PT evaluation before discharge.   Consultants: IR  Procedures: None  Antimicrobials:  Anti-infectives (From admission, onward)   None      Subjective: Patient seen and examined the bedside this morning.  Remains hemodynamically stable.  Complains of mild generalized abdominal pain.  She was hungry and thirsty.  Very concerned about her malignancy.  Objective: Vitals:   08/24/18 1039 08/24/18 1650 08/24/18 2055 08/25/18 0512  BP: (!) 114/94 122/77 (!) 136/98 125/84  Pulse: 82 78 85 94  Resp:   18 18  Temp:  98.1 F (36.7 C) (!) 97.5 F (36.4 C) 98 F (36.7 C)  TempSrc:  Oral Axillary Oral  SpO2:  97% 98% 97%  Weight:  Height:        Intake/Output Summary (Last 24 hours) at 08/25/2018 1104 Last data filed at 08/25/2018 0202 Gross per 24 hour  Intake 866.01 ml  Output -  Net 866.01 ml   Filed Weights   08/23/18 9371 08/23/18 1730  Weight: 58.5 kg 55.4 kg    Examination:  General exam: Not in  distress,average built HEENT:PERRL,Oral mucosa moist, Ear/Nose normal on gross exam Respiratory system: Bilateral equal air entry, normal vesicular breath sounds, no wheezes or crackles  Cardiovascular system: Irregularly irregular, no JVD, murmurs, rubs, gallops or clicks. No pedal edema. Gastrointestinal system: Abdomen is nondistended, soft and nontender. No organomegaly or masses felt. Normal bowel sounds heard. Central nervous system: Alert and oriented. No focal neurological deficits. Extremities: No edema, no clubbing ,no cyanosis, distal peripheral pulses palpable. Skin: No rashes, lesions or ulcers,no icterus ,no pallor MSK: Normal muscle bulk,tone ,power Psychiatry: Judgement and insight appear normal. Mood & affect appropriate.     Data Reviewed: I have personally reviewed following labs and imaging studies  CBC: Recent Labs  Lab 08/23/18 0626 08/25/18 0206  WBC 9.1 9.4  HGB 15.1* 13.9  HCT 45.6 42.6  MCV 82.5 80.4  PLT 193 696   Basic Metabolic Panel: Recent Labs  Lab 08/23/18 0626 08/24/18 0923 08/25/18 0206  NA 138 138 141  K 3.8 3.8 3.6  CL 99 105 104  CO2 23 23 24   GLUCOSE 146* 113* 112*  BUN 21 18 17   CREATININE 1.12* 1.03* 0.98  CALCIUM 11.9* 11.3* 11.0*  MG  --   --  1.9   GFR: Estimated Creatinine Clearance: 42.2 mL/min (by C-G formula based on SCr of 0.98 mg/dL). Liver Function Tests: Recent Labs  Lab 08/23/18 0645 08/24/18 0923 08/25/18 0206  AST 103* 78* 66*  ALT 88* 68* 62*  ALKPHOS 388* 352* 334*  BILITOT 1.0 1.2 1.0  PROT 7.6 6.0* 6.1*  ALBUMIN 3.5 2.8* 2.9*   Recent Labs  Lab 08/23/18 0645  LIPASE 40   No results for input(s): AMMONIA in the last 168 hours. Coagulation Profile: Recent Labs  Lab 08/23/18 0645 08/24/18 0346 08/25/18 0206  INR 2.58 2.40 1.57   Cardiac Enzymes: No results for input(s): CKTOTAL, CKMB, CKMBINDEX, TROPONINI in the last 168 hours. BNP (last 3 results) No results for input(s): PROBNP in the  last 8760 hours. HbA1C: No results for input(s): HGBA1C in the last 72 hours. CBG: No results for input(s): GLUCAP in the last 168 hours. Lipid Profile: No results for input(s): CHOL, HDL, LDLCALC, TRIG, CHOLHDL, LDLDIRECT in the last 72 hours. Thyroid Function Tests: No results for input(s): TSH, T4TOTAL, FREET4, T3FREE, THYROIDAB in the last 72 hours. Anemia Panel: No results for input(s): VITAMINB12, FOLATE, FERRITIN, TIBC, IRON, RETICCTPCT in the last 72 hours. Sepsis Labs: No results for input(s): PROCALCITON, LATICACIDVEN in the last 168 hours.  Recent Results (from the past 240 hour(s))  Urine culture     Status: Abnormal   Collection Time: 08/23/18 10:32 AM  Result Value Ref Range Status   Specimen Description URINE, RANDOM  Final   Special Requests NONE  Final   Culture (A)  Final    60,000 COLONIES/mL GROUP B STREP(S.AGALACTIAE)ISOLATED TESTING AGAINST S. AGALACTIAE NOT ROUTINELY PERFORMED DUE TO PREDICTABILITY OF AMP/PEN/VAN SUSCEPTIBILITY. Performed at [facility], [place] [address], [place], [place] [address]    Report Status 08/24/2018 FINAL  Final         Radiology Studies: No results found.  Scheduled Meds: . cycloSPORINE  1 drop Both Eyes BID  . dofetilide  375 mcg Oral BID  . dorzolamide-timolol  1 drop Both Eyes BID  . feeding supplement (ENSURE ENLIVE)  237 mL Oral BID BM  . furosemide  80 mg Oral QPM  . latanoprost  1 drop Both Eyes QHS  . levothyroxine  75 mcg Oral QAC breakfast  . metoprolol succinate  50 mg Oral Daily  . multivitamin  1 tablet Oral BID  . pantoprazole  40 mg Oral Daily  . PARoxetine  20 mg Oral Daily  . [START ON 08/26/2018] potassium chloride  20 mEq Oral BID  . potassium chloride  40 mEq Oral BID  . rosuvastatin  40 mg Oral QPM   Continuous Infusions: . sodium chloride 100 mL/hr at 08/25/18 0201  . heparin 750 Units/hr (08/25/18 1014)     LOS: 2 days    Time spent:35 mins. More than 50% of that time was  spent in counseling and/or coordination of care.      Shelly Coss, MD [facility] Pager [phone]  If 7PM-7AM, please contact night-coverage [email or web address] Password [facility] 08/25/2018, 11:04 AM

## 2018-08-25 NOTE — Progress Notes (Signed)
ANTICOAGULATION CONSULT NOTE - Initial Consult  Pharmacy Consult for Heparin Indication: atrial fibrillation  Allergies  Allergen Reactions  . Citalopram Hydrobromide Other (See Comments)    Headaches and insomnia  . Trazodone And Nefazodone Other (See Comments)    Weakness  . Liver Nausea And Vomiting    Patient Measurements: Height: 5\' 4"  (162.6 cm) Weight: 122 lb 2.2 oz (55.4 kg) IBW/kg (Calculated) : 54.7 Heparin Dosing Weight: 55.4 kg  Vital Signs: Temp: 98.7 F (37.1 C) (02/12 1650) Temp Source: Oral (02/12 0512) BP: 147/81 (02/12 1700) Pulse Rate: 62 (02/12 1700)  Labs: Recent Labs    08/23/18 0626 08/23/18 0645 08/24/18 0346 08/24/18 0923 08/25/18 0206  HGB 15.1*  --   --   --  13.9  HCT 45.6  --   --   --  42.6  PLT 193  --   --   --  175  LABPROT  --  27.3* 25.8*  --  18.6*  INR  --  2.58 2.40  --  1.57  CREATININE 1.12*  --   --  1.03* 0.98    Estimated Creatinine Clearance: 42.2 mL/min (by C-G formula based on SCr of 0.98 mg/dL).  Assessment: 7 YOF on warfarin PTA for afib, now on IV heparin while holding coumadin for liver mass work up. S/p liver biopsy this afternoon, heparin was on hold during procedure. Bleeding risk - standard. No heparin level yet since heparin was started earlier today  Plan: Hold heparin for now Restart heparin 750 units/hr at 2230 (6 hrs post procedure) with no bolus F/u heparin level at 0700 in AM  Maryanna Shape, PharmD, BCPS, [place] Pharmacist  Pager: [phone]    08/25/2018,5:01 PM

## 2018-08-25 NOTE — Procedures (Signed)
Interventional Radiology Procedure Note  Procedure: US guided core biopsy liver lesion in right hemiliver  Complications: None  Estimated Blood Loss: None  Recommendations: - Path pending - Bedrest x 2 hrs  Signed,  Criselda Peaches, MD

## 2018-08-26 LAB — COMPREHENSIVE METABOLIC PANEL
ALK PHOS: 325 U/L — AB (ref 38–126)
ALT: 52 U/L — ABNORMAL HIGH (ref 0–44)
AST: 65 U/L — ABNORMAL HIGH (ref 15–41)
Albumin: 2.7 g/dL — ABNORMAL LOW (ref 3.5–5.0)
Anion gap: 11 (ref 5–15)
BUN: 18 mg/dL (ref 8–23)
CO2: 21 mmol/L — ABNORMAL LOW (ref 22–32)
Calcium: 10.7 mg/dL — ABNORMAL HIGH (ref 8.9–10.3)
Chloride: 107 mmol/L (ref 98–111)
Creatinine, Ser: 0.95 mg/dL (ref 0.44–1.00)
GFR calc Af Amer: >60 mL/min (ref >60)
GFR calc non Af Amer: 58 mL/min — ABNORMAL LOW (ref >60)
Glucose, Bld: 111 mg/dL — ABNORMAL HIGH (ref 70–99)
Potassium: 3.7 mmol/L (ref 3.5–5.1)
Sodium: 139 mmol/L (ref 135–145)
Total Bilirubin: 0.8 mg/dL (ref 0.3–1.2)
Total Protein: 5.6 g/dL — ABNORMAL LOW (ref 6.5–8.1)

## 2018-08-26 LAB — PROTIME-INR
INR: 1.33
Prothrombin Time: 16.4 seconds — ABNORMAL HIGH (ref 11.4–15.2)

## 2018-08-26 LAB — CBC
HCT: 38.2 % (ref 36.0–46.0)
Hemoglobin: 12.2 g/dL (ref 12.0–15.0)
MCH: 26.2 pg (ref 26.0–34.0)
MCHC: 31.9 g/dL (ref 30.0–36.0)
MCV: 82 fL (ref 80.0–100.0)
PLATELETS: 150 10*3/uL (ref 150–400)
RBC: 4.66 MIL/uL (ref 3.87–5.11)
RDW: 13.9 % (ref 11.5–15.5)
WBC: 8.9 10*3/uL (ref 4.0–10.5)
nRBC: 0 % (ref 0.0–0.2)

## 2018-08-26 LAB — HIV ANTIBODY (ROUTINE TESTING W REFLEX): HIV Screen 4th Generation wRfx: NONREACTIVE

## 2018-08-26 LAB — CEA: CEA: 11.8 ng/mL — ABNORMAL HIGH (ref 0.0–4.7)

## 2018-08-26 LAB — HEPATITIS PANEL, ACUTE
HCV Ab: <0.1 s/co ratio (ref 0.0–0.9)
HEP A IGM: NEGATIVE
HEP B S AG: NEGATIVE
Hep B C IgM: NEGATIVE

## 2018-08-26 LAB — AFP TUMOR MARKER: AFP, Serum, Tumor Marker: 8.5 ng/mL — ABNORMAL HIGH (ref 0.0–8.3)

## 2018-08-26 LAB — HEPATITIS B SURFACE ANTIBODY,QUALITATIVE: Hep B S Ab: NONREACTIVE

## 2018-08-26 LAB — HEPATITIS B SURFACE ANTIGEN: Hepatitis B Surface Ag: NEGATIVE

## 2018-08-26 LAB — HEPARIN LEVEL (UNFRACTIONATED): Heparin Unfractionated: 0.11 IU/mL — ABNORMAL LOW (ref 0.30–0.70)

## 2018-08-26 MED ORDER — WARFARIN - PHARMACIST DOSING INPATIENT
Freq: Every day | Status: DC
  Administered 2018-08-26: 17:00:00

## 2018-08-26 MED ORDER — FUROSEMIDE 40 MG PO TABS
40.0000 mg | ORAL_TABLET | Freq: Every evening | ORAL | Status: DC
  Administered 2018-08-26: 40 mg via ORAL
  Filled 2018-08-26 (×2): qty 1

## 2018-08-26 MED ORDER — POTASSIUM CHLORIDE CRYS ER 20 MEQ PO TBCR
20.0000 meq | EXTENDED_RELEASE_TABLET | Freq: Two times a day (BID) | ORAL | Status: DC
  Administered 2018-08-27: 20 meq via ORAL
  Filled 2018-08-26: qty 1

## 2018-08-26 MED ORDER — POTASSIUM CHLORIDE CRYS ER 20 MEQ PO TBCR
40.0000 meq | EXTENDED_RELEASE_TABLET | Freq: Three times a day (TID) | ORAL | Status: DC
  Administered 2018-08-26: 40 meq via ORAL
  Filled 2018-08-26: qty 2

## 2018-08-26 MED ORDER — WARFARIN SODIUM 6 MG PO TABS
6.0000 mg | ORAL_TABLET | Freq: Once | ORAL | Status: AC
  Administered 2018-08-26: 6 mg via ORAL
  Filled 2018-08-26: qty 1

## 2018-08-26 NOTE — Progress Notes (Signed)
PROGRESS NOTE    Jo Hughes  VHQ:[id number] DOB: 1940-10-10 DOA: 08/23/2018 PCP: Lavone Orn, MD   Brief Narrative: Patient is a 78 year old female with past medical history of bradycardia status post pacemaker placement, chronic persistent A. fib, status post ablation on 07/27/2018, CHF with preserved ejection fraction, coronary disease who presented from home with several days of diffuse body pain.  She was found to have hypercalcemia.  Imaging showed elevated left ribs concerning for malignancy, large mass in the right lobe of the liver, multiple skeletal lytic lesions concerning for metastatic disease of possible hepatocellular carcinoma cholangiocarcinoma.  Underwent liver biopsy on 08/25/18.  Oncology consulted.  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  Underwent liver biopsy by IR. Oncology consulted.  She will follow-up with oncology as an outpatient.  Elevated AFP and CEA.  Hepatitis panel negative.  Elevated LFTs: Secondary to primary versus metastatic disease of the liver.  Continue to monitor.  Patient complains of diffuse generalized abdominal pain.  Persistent atrial fibrillation: Currently rate is controlled.  Status post ablation in January.  On Tikosyn.  On warfarin at home.  She had to be given vitamin K to lower the INR for biopsy. Was on heparin drip for anticoagulation.  She follows with cardiology as an  outpatient.  She also has a pacemaker.  Will restart warfarin today.  Acute on chronic hypercalcemia: Elevated baseline calcium at 10.5-10.8.  Today calcium level is 10.7.  She was given a dose of Zometa yesterday.  Generalized body aches/generalized weakness: Seen by physical therapy and recommended skilled nursing facility on discharge.  Social worker consulted  Chronic congestive heart failure with preserved ejection fraction: Currently euvolemic.  Continue home Lasix regimen.  It will also help with her hypercalcemia.  Hypothyroidism: Currently stable.  Continue Synthyroid  GERD/hyperlipidemia: Continue Protonix/Crestor  Episodes of confusion: Currently alert and oriented.  CT of the brain with contrast did not show any metastatic process.   Patient is medically stable for discharge to skilled nursing facility as soon as the bed is available.    Nutrition Problem: Moderate Malnutrition Etiology: chronic illness(bone cancer)      DVT prophylaxis: Coumadin Code Status: Full code Family Communication: None present at the bedside Disposition Plan: SNF as soon as the bed is available  Consultants: IR  Procedures: None  Antimicrobials:  Anti-infectives (From admission, onward)   None      Subjective: Patient seen and examined the bedside this morning.  Hemodynamically stable.  Was working with physical therapy.  Complains of diffuse body ache.  Objective: Vitals:   08/25/18 1730 08/25/18 1745 08/25/18 1800 08/26/18 0502  BP: (!) 151/64 (!) 147/85 (!) 151/76 (!) 159/65  Pulse: 60 60 64 62  Resp:    16  Temp: 98.6 F (37 C) 98.6 F (37 C) 98.7 F (37.1 C) 98.2 F (36.8 C)  TempSrc: Oral Oral Oral Oral  SpO2: 99% 100% 100% 98%  Weight:      Height:        Intake/Output Summary (Last 24 hours) at 08/26/2018 1146 Last data filed at 08/26/2018 0641 Gross per 24 hour  Intake 275.91 ml  Output 400 ml  Net -124.09 ml   Filed Weights   08/23/18 0102 08/23/18 1730   Weight: 58.5 kg 55.4 kg    Examination: General exam: In distress, elderly female, weak  HEENT:PERRL,Oral mucosa moist, Ear/Nose normal on gross exam Respiratory system: Bilateral equal air entry, normal vesicular breath sounds, no wheezes or crackles  Cardiovascular system: Irregularly irregular. No JVD, murmurs, rubs, gallops or clicks. Gastrointestinal system: Abdomen is nondistended, soft and nontender. No organomegaly or masses felt. Normal bowel sounds heard. Central nervous system: Alert and oriented. No focal neurological deficits. Extremities: No edema, no clubbing ,no cyanosis, distal peripheral pulses palpable. Skin: No rashes, lesions or ulcers,no icterus ,no pallor MSK: Normal muscle bulk,tone ,power Psychiatry: Judgement and insight appear normal. Mood & affect appropriate.     Data Reviewed: I have personally reviewed following labs and imaging studies  CBC: Recent Labs  Lab 08/23/18 0626 08/25/18 0206 08/26/18 0328  WBC 9.1 9.4 8.9  HGB 15.1* 13.9 12.2  HCT 45.6 42.6 38.2  MCV 82.5 80.4 82.0  PLT 193 175 725   Basic Metabolic Panel: Recent Labs  Lab 08/23/18 0626 08/24/18 0923 08/25/18 0206 08/26/18 0328  NA 138 138 141 139  K 3.8 3.8 3.6 3.7  CL 99 105 104 107  CO2 23 23 24  21*  GLUCOSE 146* 113* 112* 111*  BUN 21 18 17 18   CREATININE 1.12* 1.03* 0.98 0.95  CALCIUM 11.9* 11.3* 11.0* 10.7*  MG  --   --  1.9  --    GFR: Estimated Creatinine Clearance: 43.5 mL/min (by C-G formula based on SCr of 0.95 mg/dL). Liver Function Tests: Recent Labs  Lab 08/23/18 0645 08/24/18 0923 08/25/18 0206 08/26/18 0328  AST 103* 78* 66* 65*  ALT 88* 68* 62* 52*  ALKPHOS 388* 352* 334* 325*  BILITOT 1.0 1.2 1.0 0.8  PROT 7.6 6.0* 6.1* 5.6*  ALBUMIN 3.5 2.8* 2.9* 2.7*   Recent Labs  Lab 08/23/18 0645  LIPASE 40   No results for input(s): AMMONIA in the last 168 hours. Coagulation Profile: Recent Labs  Lab 08/23/18 0645 08/24/18 0346 08/25/18 0206  08/26/18 0328  INR 2.58 2.40 1.57 1.33   Cardiac Enzymes: No results for input(s): CKTOTAL, CKMB, CKMBINDEX, TROPONINI in the last 168 hours. BNP (last 3 results) No results for input(s): PROBNP in the last 8760 hours. HbA1C: No results for input(s): HGBA1C in the last 72 hours. CBG: No results for input(s): GLUCAP in the last 168 hours. Lipid Profile: No results for input(s): CHOL, HDL, LDLCALC, TRIG, CHOLHDL, LDLDIRECT in the last 72 hours. Thyroid Function Tests: No results for input(s): TSH, T4TOTAL, FREET4, T3FREE, THYROIDAB in the last 72 hours. Anemia Panel: No results for input(s): VITAMINB12, FOLATE, FERRITIN, TIBC, IRON, RETICCTPCT in the last 72 hours. Sepsis Labs: No results for input(s): PROCALCITON, LATICACIDVEN in the last 168 hours.  Recent Results (from the past 240 hour(s))  Urine culture     Status: Abnormal   Collection Time: 08/23/18 10:32 AM  Result Value Ref Range Status   Specimen Description URINE, RANDOM  Final   Special Requests NONE  Final   Culture (A)  Final    60,000 COLONIES/mL GROUP B STREP(S.AGALACTIAE)ISOLATED TESTING AGAINST S. AGALACTIAE NOT ROUTINELY PERFORMED DUE TO PREDICTABILITY OF AMP/PEN/VAN SUSCEPTIBILITY. Performed  at [facility], [place] [address] [place], [place] [address]    Report Status 08/24/2018 FINAL  Final         Radiology Studies: Ct Head W & Wo Contrast  Result Date: 08/25/2018 CLINICAL DATA:  Initial evaluation for encephalopathy, metastatic workup. EXAM: CT HEAD WITHOUT AND WITH CONTRAST TECHNIQUE: Contiguous axial images were obtained from the base of the skull through the vertex without and with intravenous contrast CONTRAST:  75mL OMNIPAQUE IOHEXOL 300 MG/ML  SOLN COMPARISON:  Prior MRI from 10/28/2010. FINDINGS: Brain: Generalized age-related cerebral atrophy with mild chronic small vessel ischemic disease. No acute intracranial hemorrhage. No acute large vessel territory infarct. No mass lesion, midline  shift, or mass effect. No hydrocephalus. No extra-axial fluid collection. No abnormal enhancement seen with contrast administration. No findings to suggest metastatic disease. Vascular: Normal intravascular enhancement seen throughout the intracranial circulation. Scattered vascular calcifications noted within the carotid siphons. Skull: Scalp soft tissues demonstrate no acute finding. Calvarium intact. No focal osseous lesions. Sinuses/Orbits: Globes and orbital soft tissues within normal limits. Mild mucosal thickening within the left ethmoidal air cells. Paranasal sinuses and mastoid air cells are otherwise clear. Other: None. IMPRESSION: 1. No acute intracranial abnormality. No abnormal enhancement or evidence for metastatic disease. 2. Age-related cerebral atrophy with mild chronic small vessel ischemic disease. Electronically Signed   By: Jeannine Boga M.D.   On: 08/25/2018 16:48   US Biopsy (liver)  Result Date: 08/25/2018 INDICATION: 78 year old female with recently diagnosed multiple liver lesions and bone lesions concerning for metastatic disease of uncertain primary. Possible cholangiocarcinoma or hepatocellular carcinoma. She presents for ultrasound-guided core biopsy of liver lesion to confirm tissue diagnosis EXAM: Ultrasound-guided core biopsy of liver lesion MEDICATIONS: None. ANESTHESIA/SEDATION: Moderate (conscious) sedation was employed during this procedure. A total of Versed 1.5 mg and Fentanyl 50 mcg was administered intravenously. Moderate Sedation Time: 16 minutes. The patient's level of consciousness and vital signs were monitored continuously by radiology nursing throughout the procedure under my direct supervision. FLUOROSCOPY TIME:  None COMPLICATIONS: None immediate. PROCEDURE: Informed written consent was obtained from the patient after a thorough discussion of the procedural risks, benefits and alternatives. All questions were addressed. Maximal Sterile Barrier Technique was  utilized including caps, mask, sterile gowns, sterile gloves, sterile drape, hand hygiene and skin antiseptic. A timeout was performed prior to the initiation of the procedure. The liver was interrogated with ultrasound. A suitable 1.3 x 1.5 cm hypoechoic lesion was identified in the central right hepatic lobe. A suitable skin entry site was selected and marked. The overlying skin was sterilely prepped and draped in standard fashion using chlorhexidine skin prep. Local anesthesia was attained by infiltration with 1% lidocaine. A small dermatotomy was made. Under real-time sonographic guidance, a 17 gauge introducer needle was advanced to the margin of the mass. Multiple 18 gauge core biopsies were then obtained using the bio Pince automated biopsy device. Biopsy specimens were placed in formalin and delivered to pathology for further analysis. As the introducer needle was removed, the biopsy tract was embolized with a Gel-Foam slurry. Post biopsy ultrasound images demonstrate no immediate complication. IMPRESSION: Technically successful ultrasound-guided core biopsy of right hepatic lesion. Electronically Signed   By: Jacqulynn Cadet M.D.   On: 08/25/2018 16:52        Scheduled Meds: . cycloSPORINE  1 drop Both Eyes BID  . dofetilide  375 mcg Oral BID  . dorzolamide-timolol  1 drop Both Eyes BID  . feeding supplement (ENSURE ENLIVE)  237 mL Oral BID BM  . furosemide  80 mg Oral QPM  . latanoprost  1 drop Both Eyes QHS  . levothyroxine  75 mcg Oral QAC breakfast  . metoprolol succinate  50 mg Oral Daily  . multivitamin  1 tablet Oral BID  . pantoprazole  40 mg Oral Daily  . PARoxetine  20 mg Oral Daily  . polyethylene glycol  17 g Oral Daily  . [START ON 08/27/2018] potassium chloride  20 mEq Oral BID  . potassium chloride  40 mEq Oral TID  . rosuvastatin  40 mg Oral QPM  . warfarin  6 mg Oral ONCE-1800  . Warfarin - Pharmacist Dosing Inpatient   Does not apply q1800   Continuous  Infusions:    LOS: 3 days    Time spent:35 mins. More than 50% of that time was spent in counseling and/or coordination of care.      Shelly Coss, MD [facility] Pager [phone]  If 7PM-7AM, please contact night-coverage [email or web address] Password [facility] 08/26/2018, 11:46 AM

## 2018-08-26 NOTE — Discharge Instructions (Signed)

## 2018-08-26 NOTE — Social Work (Signed)
Provided pt with CMS packet, she prefers [facility] if they have a bed. If not she states [facility] is her second choice.   [facility] has no beds, [facility] will initiate insurance approval.  PASRR pending.  Westley Hummer, MSW, [place] [employer] [phone]

## 2018-08-26 NOTE — Consult Note (Signed)
Referring Physician:  Dr. Tawanna Solo  Diagnosis Metastatic cancer ([place])  Cancer [facility]) - Plan: US BIOPSY (LIVER), US BIOPSY (LIVER), CANCELED: CT guided needle placement, CANCELED: CT guided needle placement  Mass of multiple sites of liver  Lytic bone lesions on xray  Liver mass, right lobe - Plan: CANCELED: IR Radiologist Eval & Mgmt, CANCELED: IR Radiologist Eval & Mgmt  Hypercalcemia - Plan: zolendronic acid (ZOMETA) 4 mg in sodium chloride 0.9 % 100 mL IVPB  Staging Cancer Staging No matching staging information was found for the patient.   HPI:  78 year old female admitted 08/23/2018 due to pain occasionally at 8 out of 10, aggravated by movement, spasmodic. Patient also has lost about 10 pounds of weight in last 1 month.  Denies any nausea or vomiting.  Denies any hematemesis, hemoptysis or hematochezia.  Denies any shortness of breath.  Denies any wheezing. No personal or family history of cancer. ED Course: Hemodynamically stable in the emergency room.  Creatinine is 1.12, calcium is 11.9.  A chest x-ray showed probable malignant lesion on the left fourth rib that prompted a CT scan of the chest abdomen and pelvis that shows 2 hypodense lesions on liver, multiple lytic lesions on the left posterior ribs, T12 lytic lesion as well as metastatic lytic lesion on pelvis.  Her INR is 2.58.  Cardiology suggested not to stop Coumadin because of recent ablation and high risk of thromboembolism.  Pt was bridged.    She smoked in the past but quit more than 30 years ago.  Last mammogram was in 2017 and was negative.  Pt reports she is going through a divorce and can't go home.    Oncology consulted for further evaluation due to abnormal scan findings.    Past Medical History Past Medical History:  Diagnosis Date  . Anxiety   . Atrial fibrillation ([place])    persistent  . CAD (coronary artery disease)   . Chronic diastolic CHF (congestive heart failure) ([place])   . 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 Glaucoma   . H/O mitral valve repair 2005   [place].  . H/O tricuspid valve repair   . Hx of acquired endocarditis 1970; 1986   Prior to mitral valve repair   . Hyperlipidemia    cardiac cath clean coronary arties in the past.  . Hypothyroid   . Insomnia 12/11/2015  . Kidney stone 10/2013   "pass it"  . Long term (current) use of anticoagulants    No bleeding  . Macular degeneration    early , [place]  . OSA (obstructive sleep apnea) 08/14/2015   Mild with AHI 7/hr  . Symptomatic bradycardia    s/p PPM  . Thyroid nodule     Past Surgical History Past Surgical History:  Procedure Laterality Date  . ABDOMINAL HYSTERECTOMY  1986  . ATRIAL FIBRILLATION ABLATION N/A 11/08/2013   Procedure: ATRIAL FIBRILLATION ABLATION;  Surgeon: Coralyn Mark, MD;  Location: [facility] CATH LAB;  Service: Cardiovascular;  Laterality: N/A;  . ATRIAL FIBRILLATION ABLATION N/A 07/27/2018   Procedure: ATRIAL FIBRILLATION ABLATION;  Surgeon: Thompson Grayer, MD;  Location: [facility] CV LAB;  Service: Cardiovascular;  Laterality: N/A;  . BACK SURGERY    . BREAST BIOPSY Left X 2   "benign"  . CARDIAC CATHETERIZATION  X 3  . CARDIOVERSION  ~ 2011; 09/18/2011   ?; Procedure: CARDIOVERSION;  Surgeon: Sinclair Grooms, MD;  Location:  [employer] OR;  Service: Cardiovascular;  Laterality: N/A;  . CARDIOVERSION N/A 04/29/2013   Procedure: CARDIOVERSION;  Surgeon: Sinclair Grooms, MD;  Location: [facility];  Service: Cardiovascular;  Laterality: N/A;  . CATARACT EXTRACTION W/ INTRAOCULAR LENS  IMPLANT, BILATERAL  ~ 2011  . DILATION AND CURETTAGE OF UTERUS    . INSERT / REPLACE / REMOVE PACEMAKER  01/13/2012   MDT Adapta L implanted by Dr Rayann Heman  . KNEE CARTILAGE SURGERY Left 02/2009   scope; Dr. Berenice Primas  . LUMBAR LAMINECTOMY  1997  . MITRAL VALVE REPAIR  05/2004   [facility]  . PERMANENT PACEMAKER INSERTION N/A 01/13/2012   Procedure: PERMANENT PACEMAKER  INSERTION;  Surgeon: Thompson Grayer, MD;  Location: [facility] CATH LAB;  Service: Cardiovascular;  Laterality: N/A;  . TEE WITHOUT CARDIOVERSION N/A 11/07/2013   Procedure: TRANSESOPHAGEAL ECHOCARDIOGRAM (TEE);  Surgeon: Pixie Casino, MD;  Location: [facility] ENDOSCOPY;  Service: Cardiovascular;  Laterality: N/A;  . TONSILLECTOMY  1948  . TRICUSPID VALVE SURGERY  05/2004   "repair; [facility]"    Family History Family History  Problem Relation Age of Onset  . Stroke Father   . Heart attack Father 80  . Heart disease Father   . Hyperlipidemia Father   . Hypertension Father   . Macular degeneration Mother   . Transient ischemic attack Mother        multiple  . Varicose Veins Mother   . Hypertension Brother      Social History  reports that she quit smoking about 31 years ago. Her smoking use included cigarettes. She has a 24.00 pack-year smoking history. She has never used smokeless tobacco. She reports current alcohol use. She reports that she does not use drugs.  Medications Prior to Admission medications   Medication Sig Start Date End Date Taking? Authorizing Provider  ALPRAZolam Duanne Moron) 0.5 MG tablet Take 0.5 mg by mouth 2 (two) times daily as needed for anxiety.    Yes [provider]  amoxicillin (AMOXIL) 500 MG capsule Take 2,000 mg by mouth See admin instructions. Take four 4 capsules (2000 mg) by mouth one (1) hour prior to dental procedures. 08/22/14  Yes [provider]  colchicine 0.6 MG tablet Take 1 tablet (0.6 mg total) by mouth 2 (two) times daily. 08/13/18 08/13/19 Yes Sherran Needs, NP  cycloSPORINE (RESTASIS) 0.05 % ophthalmic emulsion Place 1 drop into both eyes 2 (two) times daily.    Yes [provider]  dofetilide (TIKOSYN) 125 MCG capsule Take 1 capsule by mouth twice daily along with a 250 mcg capsule Patient taking differently: Take 125 mcg by mouth 2 (two) times daily.  11/11/17  Yes Allred, Jeneen Rinks, MD  dofetilide (TIKOSYN) 250 MCG capsule  Take 1 capsule by mouth twice daily along with a 125 mcg capsule Patient taking differently: Take 250 mcg by mouth 2 (two) times daily.  11/11/17  Yes Allred, Jeneen Rinks, MD  dorzolamide-timolol (COSOPT) 22.3-6.8 MG/ML ophthalmic solution Place 1 drop into both eyes 2 (two) times daily as needed (for dryness).    Yes [provider]  furosemide (LASIX) 80 MG tablet Take 80 mg by mouth every evening.  10/29/14  Yes [provider]  HYDROcodone-acetaminophen (NORCO/VICODIN) 5-325 MG per tablet Take 1 tablet by mouth every 6 (six) hours as needed for moderate pain or severe pain.    Yes [provider]  latanoprost (XALATAN) 0.005 % ophthalmic solution Place 1 drop into both eyes at bedtime.    Yes [provider]  metoprolol succinate (TOPROL-XL) 25 MG 24 hr tablet TAKE 2 TABLETS BY MOUTH ONCE DAILY. Patient taking differently: Take 50 mg by mouth daily.  08/18/18  Yes Belva Crome, MD  Multiple Vitamins-Minerals (ICAPS) CAPS Take 1 capsule by mouth 2 (two) times daily.    Yes [provider]  naproxen sodium (ALEVE) 220 MG tablet Take 220-440 mg by mouth 2 (two) times daily as needed (for headaches).   Yes [provider]  Omega-3 Fatty Acids (FISH OIL) 1000 MG CAPS Take 1,000 mg by mouth 2 (two) times daily.    Yes [provider]  pantoprazole (PROTONIX) 40 MG tablet Take 1 tablet (40 mg total) by mouth daily. 07/28/18 09/11/18 Yes Baldwin Jamaica, PA-C  PARoxetine (PAXIL) 20 MG tablet Take 1 tablet (20 mg total) by mouth daily. 06/14/15  Yes Allred, Jeneen Rinks, MD  potassium chloride SA (K-DUR,KLOR-CON) 20 MEQ tablet TAKE 1 TABLET BY MOUTH TWICE DAILY. Patient taking differently: Take 20 mEq by mouth 2 (two) times daily.  09/04/17  Yes Allred, Jeneen Rinks, MD  rosuvastatin (CRESTOR) 40 MG tablet Take 40 mg by mouth every evening.    Yes [provider]  SYNTHROID 75 MCG tablet Take 75 mcg by mouth daily before breakfast. 05/17/18  Yes [provider]  traMADol (ULTRAM) 50 MG tablet Take 50 mg by mouth every 12 (twelve) hours as needed for moderate pain.    Yes [provider]  warfarin (COUMADIN) 5 MG tablet TAKE AS DIRECTED BY COUMADIN CLINIC. Patient taking differently: Take 2.5-5 mg by mouth See admin instructions. For the week of 08/22/2018: Take 2.5 mg by mouth in the morning on Sun/Tues/Thurs, 5 mg on Mon/Wed and RE-CHECK INR on Friday, 08/27/2018 05/10/18  Yes Belva Crome, MD  ZIOPTAN 0.0015 % SOLN Place 1 drop into both eyes at bedtime.  03/28/13  Yes [provider]  zolmitriptan (ZOMIG) 5 MG tablet Take 5 mg by mouth daily as needed for migraine.    Yes [provider]    Allergies Citalopram hydrobromide; Trazodone and nefazodone; and Liver  Review of Systems Review of Systems - Oncology ROS negative   Physical Exam  Vitals Wt Readings from Last 3 Encounters:  08/23/18 122 lb 2.2 oz (55.4 kg)  08/16/18 124 lb 9.6 oz (56.5 kg)  08/13/18 127 lb (57.6 kg)   Temp Readings from Last 3 Encounters:  08/26/18 98 F (36.7 C) (Oral)  07/28/18 98.2 F (36.8 C) (Oral)  12/10/13 98.6 F (37 C) (Oral)   BP Readings from Last 3 Encounters:  08/26/18 123/81  08/16/18 110/72  08/13/18 102/64   Pulse Readings from Last 3 Encounters:  08/26/18 60  08/16/18 87  08/13/18 83   Constitutional: Well-developed, well-nourished, and in no distress.  Mildly fatigued.   HENT: Head: Normocephalic and atraumatic.  Mouth/Throat: No oropharyngeal exudate. Mucosa moist. Eyes: Pupils are equal, round, and reactive to light. Conjunctivae are normal. No scleral icterus.  Neck: Normal range of motion. Neck supple. No JVD present.  Cardiovascular: Normal rate, regular rhythm and normal heart sounds.  Exam reveals no gallop and no friction rub.   No murmur heard. Pulmonary/Chest: Effort normal and breath sounds normal. No respiratory distress. No wheezes.No rales.  Abdominal: Soft. Bowel sounds are  normal. Mild distension.  Tender in epigastric region.  There is no guarding.  Musculoskeletal: No edema or tenderness.  Lymphadenopathy: No cervical, axillary or supraclavicular adenopathy.  Neurological: Alert and oriented to person, place, and  time. No cranial nerve deficit.  Skin: Skin is warm and dry. No rash noted. No erythema. No pallor.  Psychiatric: Affect and judgment normal.   Labs Admission on 08/23/2018  Component Date Value Ref Range Status  . Sodium 08/23/2018 138  135 - 145 mmol/L Final  . Potassium 08/23/2018 3.8  3.5 - 5.1 mmol/L Final  . Chloride 08/23/2018 99  98 - 111 mmol/L Final  . CO2 08/23/2018 23  22 - 32 mmol/L Final  . Glucose, Bld 08/23/2018 146* 70 - 99 mg/dL Final  . BUN 08/23/2018 21  8 - 23 mg/dL Final  . Creatinine, Ser 08/23/2018 1.12* 0.44 - 1.00 mg/dL Final  . Calcium 08/23/2018 11.9* 8.9 - 10.3 mg/dL Final  . GFR calc non Af Amer 08/23/2018 48* >60 mL/min Final  . GFR calc Af Amer 08/23/2018 55* >60 mL/min Final  . Anion gap 08/23/2018 16* 5 - 15 Final   Performed at [facility], [place] [address], [place], [place] [address]  . WBC 08/23/2018 9.1  4.0 - 10.5 K/uL Final  . RBC 08/23/2018 5.53* 3.87 - 5.11 MIL/uL Final  . Hemoglobin 08/23/2018 15.1* 12.0 - 15.0 g/dL Final  . HCT 08/23/2018 45.6  36.0 - 46.0 % Final  . MCV 08/23/2018 82.5  80.0 - 100.0 fL Final  . MCH 08/23/2018 27.3  26.0 - 34.0 pg Final  . MCHC 08/23/2018 33.1  30.0 - 36.0 g/dL Final  . RDW 08/23/2018 13.8  11.5 - 15.5 % Final  . Platelets 08/23/2018 193  150 - 400 K/uL Final  . nRBC 08/23/2018 0.0  0.0 - 0.2 % Final   Performed at [facility], [place] [address], [place], [place] [address]  . Troponin i, poc 08/23/2018 0.01  0.00 - 0.08 ng/mL Final  . Comment 3 08/23/2018          Final   Comment: Due to the release kinetics of cTnI, a negative result within the first hours of the onset of symptoms does not rule out myocardial infarction with certainty. If myocardial  infarction is still suspected, repeat the test at appropriate intervals.   . Prothrombin Time 08/23/2018 27.3* 11.4 - 15.2 seconds Final  . INR 08/23/2018 2.58   Final   Performed at [facility], [place] [address], [place], [place] [address]  . Specimen Description 08/23/2018 URINE, RANDOM   Final  . Special Requests 08/23/2018 NONE   Final  . Culture 08/23/2018 *  Final                   Value:60,000 COLONIES/mL GROUP B STREP(S.AGALACTIAE)ISOLATED TESTING AGAINST S. AGALACTIAE NOT ROUTINELY PERFORMED DUE TO PREDICTABILITY OF AMP/PEN/VAN SUSCEPTIBILITY. Performed at [facility], [place] [address], [place], [place] [address]   . Report Status 08/23/2018 08/24/2018 FINAL   Final  . Total Protein 08/23/2018 7.6  6.5 - 8.1 g/dL Final  . Albumin 08/23/2018 3.5  3.5 - 5.0 g/dL Final  . AST 08/23/2018 103* 15 - 41 U/L Final  . ALT 08/23/2018 88* 0 - 44 U/L Final  . Alkaline Phosphatase 08/23/2018 388* 38 - 126 U/L Final  . Total Bilirubin 08/23/2018 1.0  0.3 - 1.2 mg/dL Final  . Bilirubin, Direct 08/23/2018 0.3* 0.0 - 0.2 mg/dL Final  . Indirect Bilirubin 08/23/2018 0.7  0.3 - 0.9 mg/dL Final   Performed at [facility], [place] [address] [place], [place] [address]  . Lipase 08/23/2018 40  11 -  51 U/L Final   Performed at [facility], [place] [address] [place], [place] [address]  . Color, Urine 08/23/2018 YELLOW  YELLOW Final  . APPearance 08/23/2018 CLEAR  CLEAR Final  . Specific Gravity, Urine 08/23/2018 >1.046* 1.005 - 1.030 Final  . pH 08/23/2018 5.0  5.0 - 8.0 Final  . Glucose, UA 08/23/2018 NEGATIVE  NEGATIVE mg/dL Final  . Hgb urine dipstick 08/23/2018 SMALL* NEGATIVE Final  . Bilirubin Urine 08/23/2018 NEGATIVE  NEGATIVE Final  . Ketones, ur 08/23/2018 NEGATIVE  NEGATIVE mg/dL Final  . Protein, ur 08/23/2018 30* NEGATIVE mg/dL Final  . Nitrite 08/23/2018 NEGATIVE  NEGATIVE Final  . Leukocytes, UA 08/23/2018 NEGATIVE  NEGATIVE Final  . RBC / HPF 08/23/2018 0-5  0  - 5 RBC/hpf Final  . WBC, UA 08/23/2018 0-5  0 - 5 WBC/hpf Final  . Bacteria, UA 08/23/2018 NONE SEEN  NONE SEEN Final  . Squamous Epithelial / LPF 08/23/2018 0-5  0 - 5 Final   Performed at [facility], [place] [address] [place], [place] [address]  . Prothrombin Time 08/24/2018 25.8* 11.4 - 15.2 seconds Final  . INR 08/24/2018 2.40   Final   Performed at [facility], [place] [address] [place], [place] [address]  . Sodium 08/24/2018 138  135 - 145 mmol/L Final  . Potassium 08/24/2018 3.8  3.5 - 5.1 mmol/L Final  . Chloride 08/24/2018 105  98 - 111 mmol/L Final  . CO2 08/24/2018 23  22 - 32 mmol/L Final  . Glucose, Bld 08/24/2018 113* 70 - 99 mg/dL Final  . BUN 08/24/2018 18  8 - 23 mg/dL Final  . Creatinine, Ser 08/24/2018 1.03* 0.44 - 1.00 mg/dL Final  . Calcium 08/24/2018 11.3* 8.9 - 10.3 mg/dL Final  . Total Protein 08/24/2018 6.0* 6.5 - 8.1 g/dL Final  . Albumin 08/24/2018 2.8* 3.5 - 5.0 g/dL Final  . AST 08/24/2018 78* 15 - 41 U/L Final  . ALT 08/24/2018 68* 0 - 44 U/L Final  . Alkaline Phosphatase 08/24/2018 352* 38 - 126 U/L Final  . Total Bilirubin 08/24/2018 1.2  0.3 - 1.2 mg/dL Final  . GFR calc non Af Amer 08/24/2018 53* >60 mL/min Final  . GFR calc Af Amer 08/24/2018 >60  >60 mL/min Final  . Anion gap 08/24/2018 10  5 - 15 Final   Performed at [facility], [place] [address] [place], [place] [address]  . Prothrombin Time 08/25/2018 18.6* 11.4 - 15.2 seconds Final  . INR 08/25/2018 1.57   Final   Performed at [facility], [place] [address], [place], [place] [address]  . Sodium 08/25/2018 141  135 - 145 mmol/L Final  . Potassium 08/25/2018 3.6  3.5 - 5.1 mmol/L Final  . Chloride 08/25/2018 104  98 - 111 mmol/L Final  . CO2 08/25/2018 24  22 - 32 mmol/L Final  . Glucose, Bld 08/25/2018 112* 70 - 99 mg/dL Final  . BUN 08/25/2018 17  8 - 23 mg/dL Final  . Creatinine, Ser 08/25/2018 0.98  0.44 - 1.00 mg/dL Final  . Calcium 08/25/2018 11.0* 8.9 - 10.3 mg/dL  Final  . Total Protein 08/25/2018 6.1* 6.5 - 8.1 g/dL Final  . Albumin 08/25/2018 2.9* 3.5 - 5.0 g/dL Final  . AST 08/25/2018 66* 15 - 41 U/L Final  . ALT 08/25/2018 62* 0 - 44 U/L Final  . Alkaline Phosphatase 08/25/2018 334* 38 - 126 U/L Final  . Total Bilirubin 08/25/2018  1.0  0.3 - 1.2 mg/dL Final  . GFR calc non Af Amer 08/25/2018 56* >60 mL/min Final  . GFR calc Af Amer 08/25/2018 >60  >60 mL/min Final  . Anion gap 08/25/2018 13  5 - 15 Final   Performed at [facility], [place] [address], [place], [place] [address]  . WBC 08/25/2018 9.4  4.0 - 10.5 K/uL Final  . RBC 08/25/2018 5.30* 3.87 - 5.11 MIL/uL Final  . Hemoglobin 08/25/2018 13.9  12.0 - 15.0 g/dL Final  . HCT 08/25/2018 42.6  36.0 - 46.0 % Final  . MCV 08/25/2018 80.4  80.0 - 100.0 fL Final  . MCH 08/25/2018 26.2  26.0 - 34.0 pg Final  . MCHC 08/25/2018 32.6  30.0 - 36.0 g/dL Final  . RDW 08/25/2018 13.8  11.5 - 15.5 % Final  . Platelets 08/25/2018 175  150 - 400 K/uL Final  . nRBC 08/25/2018 0.0  0.0 - 0.2 % Final   Performed at [facility], [place] [address] [place], [place] [address]  . Magnesium 08/25/2018 1.9  1.7 - 2.4 mg/dL Final   Performed at [facility], [place] [address] [place], [place] [address]  . Hep B S Ab 08/25/2018 Non Reactive   Final   Comment: (NOTE)              Non Reactive: Inconsistent with immunity,                            less than 10 mIU/mL              Reactive:     Consistent with immunity,                            greater than 9.9 mIU/mL Performed At: [facility] [place], [place] [id number] Rush Farmer MD [id number]   . Hepatitis B Surface Ag 08/25/2018 Negative  Negative Final   Comment: (NOTE) Performed At: [facility] [place], [place] [id number] Rush Farmer MD YD:[id number]   . Hepatitis B Surface Ag 08/25/2018 Negative  Negative Final  . HCV Ab 08/25/2018 <0.1  0.0 - 0.9 s/co ratio Final   Comment:  (NOTE)                                  Negative:     < 0.8                             Indeterminate: 0.8 - 0.9                                  Positive:     > 0.9 The CDC recommends that a positive HCV antibody result be followed up with a HCV Nucleic Acid Amplification test (720947). Performed At: [facility] [place], [place] [id number] Rush Farmer MD HU:[id number]   . Hep A IgM 08/25/2018 Negative  Negative Final  . Hep B C IgM 08/25/2018 Negative  Negative Final  . HIV Screen 4th Generation wRfx 08/25/2018 Non Reactive  Non Reactive Final   Comment: (NOTE) Performed At: [facility] [address] [place], [place] [id number] Rush Farmer  MD RA:[id number]   . CEA 08/25/2018 11.8* 0.0 - 4.7 ng/mL Final   Comment: (NOTE)                             Nonsmokers          <3.9                             Smokers             <5.6 [employer] Electrochemiluminescence Immunoassay (ECLIA) Values obtained with different assay methods or kits cannot be used interchangeably.  Results cannot be interpreted as absolute evidence of the presence or absence of malignant disease. Performed At: [facility] [place], [place] [id number] Rush Farmer MD HT:[id number]   . AFP, Serum, Tumor Marker 08/25/2018 8.5* 0.0 - 8.3 ng/mL Final   Comment: (NOTE) [employer] Electrochemiluminescence Immunoassay (ECLIA) Values obtained with different assay methods or kits cannot be used interchangeably.  Results cannot be interpreted as absolute evidence of the presence or absence of malignant disease. This test is not interpretable in pregnant females. Performed At: [facility] [place], [place] [id number] Rush Farmer MD RC:[id number]   . Prothrombin Time 08/26/2018 16.4* 11.4 - 15.2 seconds Final  . INR 08/26/2018 1.33   Final   Performed at [facility], [place] [address], [place], [place]  [address]  . Sodium 08/26/2018 139  135 - 145 mmol/L Final  . Potassium 08/26/2018 3.7  3.5 - 5.1 mmol/L Final  . Chloride 08/26/2018 107  98 - 111 mmol/L Final  . CO2 08/26/2018 21* 22 - 32 mmol/L Final  . Glucose, Bld 08/26/2018 111* 70 - 99 mg/dL Final  . BUN 08/26/2018 18  8 - 23 mg/dL Final  . Creatinine, Ser 08/26/2018 0.95  0.44 - 1.00 mg/dL Final  . Calcium 08/26/2018 10.7* 8.9 - 10.3 mg/dL Final  . Total Protein 08/26/2018 5.6* 6.5 - 8.1 g/dL Final  . Albumin 08/26/2018 2.7* 3.5 - 5.0 g/dL Final  . AST 08/26/2018 65* 15 - 41 U/L Final  . ALT 08/26/2018 52* 0 - 44 U/L Final  . Alkaline Phosphatase 08/26/2018 325* 38 - 126 U/L Final  . Total Bilirubin 08/26/2018 0.8  0.3 - 1.2 mg/dL Final  . GFR calc non Af Amer 08/26/2018 58* >60 mL/min Final  . GFR calc Af Amer 08/26/2018 >60  >60 mL/min Final  . Anion gap 08/26/2018 11  5 - 15 Final   Performed at [facility], [place] [address] [place], [place] [address]  . WBC 08/26/2018 8.9  4.0 - 10.5 K/uL Final  . RBC 08/26/2018 4.66  3.87 - 5.11 MIL/uL Final  . Hemoglobin 08/26/2018 12.2  12.0 - 15.0 g/dL Final  . HCT 08/26/2018 38.2  36.0 - 46.0 % Final  . MCV 08/26/2018 82.0  80.0 - 100.0 fL Final  . MCH 08/26/2018 26.2  26.0 - 34.0 pg Final  . MCHC 08/26/2018 31.9  30.0 - 36.0 g/dL Final  . RDW 08/26/2018 13.9  11.5 - 15.5 % Final  . Platelets 08/26/2018 150  150 - 400 K/uL Final  . nRBC 08/26/2018 0.0  0.0 - 0.2 % Final   Performed at [facility], [place] [address] [place], [place] [address]  . Heparin Unfractionated 08/26/2018 0.11* 0.30 - 0.70 IU/mL Final   Comment: (NOTE) If heparin results  are below expected values, and patient dosage has  been confirmed, suggest follow up testing of antithrombin III levels. Performed at [facility], [place] [address] [place], [place] [address]      Pathology Orders Placed This Encounter  Procedures  . Urine culture    Standing Status:   Standing    Number of Occurrences:    1  . DG Chest 2 View    Standing Status:   Standing    Number of Occurrences:   1    Order Specific Question:   Reason for Exam (SYMPTOM  OR DIAGNOSIS REQUIRED)    Answer:   Chest/low back pain  . CT Abdomen Pelvis W Contrast    Standing Status:   Standing    Number of Occurrences:   1    Order Specific Question:   Does the patient have a contrast media/X-ray dye allergy?    Answer:   No    Order Specific Question:   If indicated for the ordered procedure, I authorize the administration of contrast media per Radiology protocol    Answer:   Yes    Order Specific Question:   Radiology Contrast Protocol - do NOT remove file path    Answer:   \\[email or web address]    Order Specific Question:   Is Oral Contrast requested for this exam?    Answer:   Per Radiology protocol    Order Specific Question:   ** REASON FOR EXAM (FREE TEXT)    Answer:   Left sided low back pain since ablation, Elevated alk phos, generalized abdominal TTP worse in LLQ.  Urinary frequency.  . CT Chest W Contrast    Standing Status:   Standing    Number of Occurrences:   1    Order Specific Question:   Does the patient have a contrast media/X-ray dye allergy?    Answer:   No    Order Specific Question:   If indicated for the ordered procedure, I authorize the administration of contrast media per Radiology protocol    Answer:   Yes    Order Specific Question:   Radiology Contrast Protocol - do NOT remove file path    Answer:   \\[email or web address]    Order Specific Question:   ** REASON FOR EXAM (FREE TEXT)    Answer:   CXR with rib erosion, CT with contrast reccomended.  . CT HEAD W & WO CONTRAST    Metastatic work-up.    Standing Status:   Standing    Number of Occurrences:   1    Order Specific Question:   Does the patient have a contrast media/X-ray dye allergy?    Answer:   No    Order Specific Question:   If indicated for the ordered procedure, I authorize the  administration of contrast media per Radiology protocol    Answer:   Yes    Order Specific Question:   Radiology Contrast Protocol - do NOT remove file path    Answer:   \\[email or web address]  . US BIOPSY (LIVER)    Standing Status:   Standing    Number of Occurrences:   1    Order Specific Question:   Lab orders requested (DO NOT place separate lab orders, these will be automatically ordered during procedure specimen collection):    Answer:   None    Order Specific Question:   Can the patient sign their own consent?    Answer:   Yes  Order Specific Question:   Symptom/Reason for Exam    Answer:   Cancer ([place]) [[id number]]  . Basic metabolic panel    Standing Status:   Standing    Number of Occurrences:   1  . CBC    Standing Status:   Standing    Number of Occurrences:   1  . Protime-INR    Standing Status:   Standing    Number of Occurrences:   1  . Hepatic function panel    Standing Status:   Standing    Number of Occurrences:   1  . Lipase, blood    Standing Status:   Standing    Number of Occurrences:   1  . Urinalysis, Routine w reflex microscopic    Standing Status:   Standing    Number of Occurrences:   1  . Protime-INR    Standing Status:   Standing    Number of Occurrences:   1  . Protime-INR    Standing Status:   Standing    Number of Occurrences:   17  . Comprehensive metabolic panel    Standing Status:   Standing    Number of Occurrences:   1  . Magnesium    Standing Status:   Standing    Number of Occurrences:   1  . Hepatitis B surface antibody    Standing Status:   Standing    Number of Occurrences:   1    Order Specific Question:   Specimen collection method    Answer:   Lab=Lab collect  . Hepatitis B surface antigen    Standing Status:   Standing    Number of Occurrences:   1    Order Specific Question:   Specimen collection method    Answer:   Lab=Lab collect  . Hepatitis c vrs RNA detect by PCR-qual    Standing Status:    Standing    Number of Occurrences:   1    Order Specific Question:   Specimen collection method    Answer:   Lab=Lab collect  . Hepatitis panel, acute    Standing Status:   Standing    Number of Occurrences:   1    Order Specific Question:   Specimen collection method    Answer:   Lab=Lab collect  . HIV Antibody (routine testing w rflx)    Standing Status:   Standing    Number of Occurrences:   1    Order Specific Question:   Specimen collection method    Answer:   Lab=Lab collect  . CEA    Standing Status:   Standing    Number of Occurrences:   1    Order Specific Question:   Specimen collection method    Answer:   Lab=Lab collect  . AFP tumor marker    Standing Status:   Standing    Number of Occurrences:   1    Order Specific Question:   Specimen collection method    Answer:   Lab=Lab collect  . Calcium, ionized    Standing Status:   Standing    Number of Occurrences:   1    Order Specific Question:   Specimen collection method    Answer:   Lab=Lab collect  . Heparin level (unfractionated)    Standing Status:   Standing    Number of Occurrences:   1    Order Specific Question:   Specimen collection method    Answer:  Lab=Lab collect  . Diet regular Room service appropriate? Yes; Fluid consistency: Thin    Standing Status:   Standing    Number of Occurrences:   1    Order Specific Question:   Room service appropriate?    Answer:   Yes    Order Specific Question:   Fluid consistency:    Answer:   Thin  . Nursing Communication Interrogate pacer    Interrogate pacer    Standing Status:   Standing    Number of Occurrences:   1  . Patient may eat/drink    Standing Status:   Standing    Number of Occurrences:   1  . Vital signs    Standing Status:   Standing    Number of Occurrences:   1  . Notify physician    Standing Status:   Standing    Number of Occurrences:   20    Order Specific Question:   Notify Physician    Answer:   for pulse less than 55 or greater than  120    Order Specific Question:   Notify Physician    Answer:   for respiratory rate less than 12 or greater than 25    Order Specific Question:   Notify Physician    Answer:   for temperature greater than 100.5 F    Order Specific Question:   Notify Physician    Answer:   for urinary output less than 30 mL/hr for four hours    Order Specific Question:   Notify Physician    Answer:   for systolic BP less than 90 or greater than 510, diastolic BP less than 60 or greater than 100  . Initiate Oral Care Protocol    Standing Status:   Standing    Number of Occurrences:   1  . Initiate Carrier Fluid Protocol    Standing Status:   Standing    Number of Occurrences:   1  . RN may order General Admission PRN Orders utilizing "General Admission PRN medications" (through manage orders) for the following patient needs: allergy symptoms (Claritin), cold sores (Carmex), cough (Robitussin DM), eye irritation (Liquifilm Tears), hemorrhoids (Tucks), indigestion (Maalox), minor skin irritation (Hydrocortisone Cream), muscle pain Suezanne Jacquet Gay), nose irritation (saline nasal spray) and sore throat (Chloraseptic spray).    Standing Status:   Standing    Number of Occurrences:   [id number]  . Up as tolerated    Standing Status:   Standing    Number of Occurrences:   1  . Intake and Output    Standing Status:   Standing    Number of Occurrences:   1  . Patient has an active order for code status: continue current code status order    Standing Status:   Standing    Number of Occurrences:   1  . Vital signs every 15 minutes x 4, every 30 minutes x 2, then per unit routine    Standing Status:   Standing    Number of Occurrences:   1  . Monitor biopsy site every 15 minutes x 4, every 30 minutes x 2, then per unit routine    Standing Status:   Standing    Number of Occurrences:   1  . Contact Interventional Radiologist PA or IR on call at [phone]    A. Increasing shortness of breath. B. Increasing pain at the  biopsy site. C. Tachycardia (pulse greater than 100 for two (2) consecutive readings).  D. Hypotension (systolic blood pressure less than 100 mmHg for two (2) consecutive readings, or E. Other obvious patient distress.    Standing Status:   Standing    Number of Occurrences:   1  . Patient has an active order for admit to inpatient/place in observation    Standing Status:   Standing    Number of Occurrences:   1  . Full code    Standing Status:   Standing    Number of Occurrences:   1  . Consult to hospitalist    Standing Status:   Standing    Number of Occurrences:   1    Order Specific Question:   Place call to:    Answer:   [employer] photographer Question:   Reason for Consult    Answer:   Admit  . Consult to social work    Plan for [place] today.Please assess her social situation at home. She was saying she is going through divorce. She most likely has a cancer now. Just inquiring for safe discharge.Requested PT today    Standing Status:   Standing    Number of Occurrences:   1    Order Specific Question:   Reason for Consult:    Answer:   Other (see comments)  . warfarin (COUMADIN) per pharmacy consult    Standing Status:   Standing    Number of Occurrences:   1    Order Specific Question:   Indication:    Answer:   Atrial Fibrillation  . PT eval and treat    Standing Status:   Standing    Number of Occurrences:   1  . I-stat troponin, ED    Standing Status:   Standing    Number of Occurrences:   1  . EKG 12-Lead    Standing Status:   Standing    Number of Occurrences:   1  . ED EKG    Standing Status:   Standing    Number of Occurrences:   1    Order Specific Question:   Reason for Exam    Answer:   Weakness  . EKG  . Admit to Inpatient (patient's expected length of stay will be greater than 2 midnights or inpatient only procedure)    Standing Status:   Standing    Number of Occurrences:   1    Order Specific Question:   Hospital Area    Answer:   [place] [[id number]]    Order Specific Question:   Level of Care    Answer:   Med-Surg [16]    Order Specific Question:   Diagnosis    Answer:   Metastatic cancer to bone [facility]) [[id number]]    Order Specific Question:   Admitting Physician    Answer:   Tomma Rakers    Order Specific Question:   Attending Physician    Answer:   Tomma Rakers    Order Specific Question:   Estimated length of stay    Answer:   past midnight tomorrow    Order Specific Question:   Certification:    Answer:   I certify this patient will need inpatient services for at least 2 midnights    Order Specific Question:   PT Class (Do Not Modify)    Answer:   Inpatient [101]    Order Specific Question:   PT Acc Code (Do Not Modify)    Answer:  Private [1]      Assessment and Plan:  33.  78 year old female admitted 08/23/2018 due to pain occasionally at 8 out of 10, aggravated by movement, spasmodic. Patient also has lost about 10 pounds of weight in last 1 month.  Denies any nausea or vomiting.  Denies any hematemesis, hemoptysis or hematochezia.  Denies any shortness of breath.  Denies any wheezing. No personal or family history of cancer. Hemodynamically stable in the emergency room.  Creatinine is 1.12, calcium is 11.9.  A chest x-ray showed probable malignant lesion on the left fourth rib that prompted a CT scan of the chest abdomen and pelvis that was done 08/23/2018  that shows 2 hypodense lesions on liver, multiple lytic lesions on the left posterior ribs, T12 lytic lesion as well as metastatic lytic lesion on pelvis.    She smoked in the past but quit more than 30 years ago.  Last mammogram was in 2017 and was negative.  Pt reports she is going through a divorce and can't go home.    Oncology consulted for further evaluation due to abnormal scan findings.    Pt has reportedly undergone biopsy and results are pending.    Labs done 08/25/2018 reviewed and showed WBC 9.4 HB 13.9 plts  175,000.  Chemistries WNL with K+ 3.6 Cr 0.98 and elevated LFTs.  Bilirubin 1.  AFP elevated at 8.5 CEA 12.  Hepatitis panel negative.    Pt will be set up for outpatient follow-up at [facility] to discuss options of therapy based on pathology results.    2. Bone lesions.  Findings appear consistent with metastatic disease.  Pt last had mammogram in 2017.  Awaiting results of biopsy for diagnosis.  Pt will be candidate for Xgeva therapy once diagnosis ascertained.    3.  Hypercalcemia. Likely due to bone lesion and advanced malignancy.  Pt was treated with Zometa 4 MG IV.  Labs done today show Calcium level improved at 10.7.  Cr 0.95.  Continue to follow Chemistries and continue IV hydration.    4.  Atrial Fibrillation.   Cardiology suggested not to stop Coumadin because of recent ablation and high risk of thromboembolism.  Pt was bridged with heparin.  Anticoagulation per Primary team.  5.  Social concerns.  Pt reports she can't go home. She reports she is going through a divorce.  I have spoken with Dr. Tawanna Solo to have social work evaluate the patient to address concerns.    6.  Disposition.  Once pt stable for discharge, will arrange outpatient follow-up at [facility] to discuss treatment options.    60 minutes spent with more than 50% spent in review of records, counseling and coordination of care.

## 2018-08-26 NOTE — Progress Notes (Addendum)
ANTICOAGULATION CONSULT NOTE - Follow Up Consult  Pharmacy Consult for Heparin>>Coumadin Indication: atrial fibrillation  Allergies  Allergen Reactions  . Citalopram Hydrobromide Other (See Comments)    Headaches and insomnia  . Trazodone And Nefazodone Other (See Comments)    Weakness  . Liver Nausea And Vomiting    Patient Measurements: Height: 5\' 4"  (162.6 cm) Weight: 122 lb 2.2 oz (55.4 kg) IBW/kg (Calculated) : 54.7 Heparin Dosing Weight: 55.4kg  Vital Signs: Temp: 98.2 F (36.8 C) (02/13 0502) Temp Source: Oral (02/13 0502) BP: 159/65 (02/13 0502) Pulse Rate: 62 (02/13 0502)  Labs: Recent Labs    08/24/18 0346 08/24/18 0923 08/25/18 0206 08/26/18 0328 08/26/18 0702  HGB  --   --  13.9 12.2  --   HCT  --   --  42.6 38.2  --   PLT  --   --  175 150  --   LABPROT 25.8*  --  18.6* 16.4*  --   INR 2.40  --  1.57 1.33  --   HEPARINUNFRC  --   --   --   --  0.11*  CREATININE  --  1.03* 0.98 0.95  --     Estimated Creatinine Clearance: 43.5 mL/min (by C-G formula based on SCr of 0.95 mg/dL).  Assessment:   Anticoag: Afib, warfarin PTA for afib, INR 1.33 today. Hgb 15.1>13.9>12.2. Plts 150. HL 0.11 low this AM. - PTA dose, 2.5 mg TTSun, 5mg  MWFSat, admit INR 2.58 (2/10) - 2/11: Vit K  2.5mg  po x 1 - 2/12: liver bx, hep held   Goal of Therapy:  Heparin level 0.3-0.7 units/ml Monitor platelets by anticoagulation protocol: Yes   Plan:  D/c IV heparin Resume Coumadin 6mg  po x 1 tonight. Daily INR Tikosyn 350mcg BID KDur 3meq TID today, then resume 68meq BID tomorrow.   Haidyn Chadderdon S. Alford Highland, PharmD, BCPS Clinical Staff Pharmacist Eilene Ghazi Stillinger 08/26/2018,8:59 AM

## 2018-08-26 NOTE — Clinical Social Work Note (Signed)
Clinical Social Work Assessment  Patient Details  Name: Jo Hughes MRN: [id number] Date of Birth: 29-Sep-1940  Date of referral:  08/26/18               Reason for consult:  Facility Placement, Discharge Planning                Permission sought to share information with:  Family Supports, Customer service manager Permission granted to share information::  Yes, Verbal Permission Granted  Name::      Jo Hughes  Agency::   [facility]  Relationship::   cousin  Contact Information:   [phone]  Housing/Transportation Living arrangements for the past 2 months:  Single Family Home Source of Information:  Patient Patient Interpreter Needed:  None Criminal Activity/Legal Involvement Pertinent to Current Situation/Hospitalization:  No - Comment as needed Significant Relationships:  Friend, Other Family Members, [employer], Industrial/product designer, Pets Lives with:  Pets, Self Do you feel safe going back to the place where you live?  Yes Need for family participation in patient care:  Yes (Comment)  Care giving concerns:  Pt from home alone, she is currently going through a divorce. Pt states she still has a lot of pain. She is interested in SNF prior to returning home.   Social Worker assessment / plan:  CSW spoke with pt at bedside, introduced self, role, and reason for visit. Pt from home alone with a Clinical research associate she adores. She has a cousin and brother that live nearby and is currently going through a divorce. Pt is a retired Education officer, museum and currently retired. Pt states that she is interested in rehab since she lives at home alone and does not have any one to provide 24/7 assistance.   CSW explained referral process and that pt would be given bed offers today. She required therapy evals and insurance approval to d/c to SNF.   Employment status:  Retired Nurse, adult PT Recommendations:  [place], [place] /  Referral to community resources:  [place]  Patient/Family's Response to care:  Pt amenable to speaking with CSW. Interested in rehab.   Patient/Family's Understanding of and Emotional Response to Diagnosis, Current Treatment, and Prognosis:  Pt does not fully understand her diagnosis, current treatment and prognosis. Pt feels she would like to stay until "I know what's going on with me." Pt awaiting results of biopsy. We dicussed she will f/u with those results when they are available. Pt pleasant, did have some challenges with word finding, but overall conversant and pleasant.  Emotional Assessment Appearance:  Appears stated age Attitude/Demeanor/Rapport:  Engaged, Gracious Affect (typically observed):  Accepting, Adaptable, Appropriate, Restless, Pleasant Orientation:  Oriented to Self, Oriented to Place, Oriented to  Time, Oriented to Situation Alcohol / Substance use:  Not Applicable Psych involvement (Current and /or in the community):  No (Comment)  Discharge Needs  Concerns to be addressed:  Care Coordination, Discharge Planning Concerns Readmission within the last 30 days:  No Current discharge risk:  Dependent with Mobility, Physical Impairment, Lives alone Barriers to Discharge:  Continued Medical Work up, [employer], [place] 08/26/2018, 10:19 AM

## 2018-08-26 NOTE — NC FL2 (Addendum)
[place] MEDICAID FL2 LEVEL OF CARE SCREENING TOOL     IDENTIFICATION  Patient Name: Jo Hughes Birthdate: 1940/08/21 Sex: female Admission Date (Current Location): 08/23/2018  [facility] and [place] Number:  Herbalist and Address:  The . [facility], [place] [address] [place], [place] [address]      Provider Number: [id number]  Attending Physician Name and Address:  Shelly Coss, MD  Relative Name and Phone Number:       Current Level of Care: Hospital Recommended Level of Care: [place] Prior Approval Number:    Date Approved/Denied:   PASRR Number: [id number]   Discharge Plan: SNF    Current Diagnoses: Patient Active Problem List   Diagnosis Date Noted  . Malnutrition of moderate degree 08/25/2018  . Hypercalcemia 08/24/2018  . Liver mass, right lobe 08/24/2018  . Diffuse pain 08/24/2018  . Lytic bone lesions on xray 08/24/2018  . Elevated LFTs 08/24/2018  . Metastatic bone tumor ([place]) 08/23/2018  . Metastatic cancer to bone ([place]) 08/23/2018  . Persistent atrial fibrillation 07/27/2018  . Encounter for therapeutic drug monitoring 04/07/2017  . Insomnia 12/11/2015  . OSA (obstructive sleep apnea) 08/14/2015  . Snoring 05/03/2015  . On dofetilide therapy 10/27/2014  . Hyperlipidemia   . Hypothyroid   . Macular degeneration   . Status post mitral valve repair 05/17/2013    Class: Chronic  . Chronic diastolic heart failure ([place]) 05/17/2013    Class: Diagnosis of  . 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 Bradycardia 12/18/2011  . Atypical atrial flutter ([place]) 08/21/2011  . Atrial fibrillation ([place]) 08/21/2011    Orientation RESPIRATION BLADDER Height & Weight     Self, Time, Situation, Place  Normal Continent Weight: 122 lb 2.2 oz (55.4 kg) Height:  5\' 4"  (162.6 cm)  BEHAVIORAL SYMPTOMS/MOOD NEUROLOGICAL BOWEL NUTRITION STATUS      Continent Diet(see discharge summary)  AMBULATORY STATUS COMMUNICATION OF  NEEDS Skin   Extensive Assist Verbally (ecchymosis bilaterally on hands and arms)                       Personal Care Assistance Level of Assistance  Bathing, Feeding, Dressing Bathing Assistance: Maximum assistance Feeding assistance: Independent Dressing Assistance: Maximum assistance     Functional Limitations Info  Sight, Hearing, Speech Sight Info: Adequate(wears glasses) Hearing Info: Adequate Speech Info: Adequate    SPECIAL CARE FACTORS FREQUENCY  OT (By licensed OT), PT (By licensed PT)     PT Frequency: 5x week OT Frequency: 5x week            Contractures Contractures Info: Not present    Additional Factors Info  Code Status, Allergies, Psychotropic Code Status Info: Full Code Allergies Info: CITALOPRAM HYDROBROMIDE, TRAZODONE AND NEFAZODONE, LIVER  Psychotropic Info: PARoxetine (PAXIL) tablet 20 mg daily PO         Current Medications (08/26/2018):  This is the current hospital active medication list Current Facility-Administered Medications  Medication Dose Route Frequency Provider Last Rate Last Dose  . acetaminophen (TYLENOL) tablet 650 mg  650 mg Oral Q6H PRN Barb Merino, MD       Or  . acetaminophen (TYLENOL) suppository 650 mg  650 mg Rectal Q6H PRN Barb Merino, MD      . ALPRAZolam Duanne Moron) tablet 0.5 mg  0.5 mg Oral BID PRN Barb Merino, MD      . bisacodyl (DULCOLAX) EC tablet 5 mg  5 mg Oral Daily PRN Barb Merino, MD      .  cycloSPORINE (RESTASIS) 0.05 % ophthalmic emulsion 1 drop  1 drop Both Eyes BID Barb Merino, MD   1 drop at 08/26/18 0955  . dofetilide (TIKOSYN) capsule 375 mcg  375 mcg Oral BID Barb Merino, MD   375 mcg at 08/26/18 2993  . dorzolamide-timolol (COSOPT) 22.3-6.8 MG/ML ophthalmic solution 1 drop  1 drop Both Eyes BID Barb Merino, MD   1 drop at 08/26/18 0956  . feeding supplement (ENSURE ENLIVE) (ENSURE ENLIVE) liquid 237 mL  237 mL Oral BID BM Oretha Milch D, MD   237 mL at 08/26/18 0955  . fentaNYL  (SUBLIMAZE) injection   Intravenous PRN Jacqulynn Cadet, MD   50 mcg at 08/25/18 1607  . furosemide (LASIX) tablet 40 mg  40 mg Oral QPM Shelly Coss, MD   40 mg at 08/26/18 1245  . HYDROcodone-acetaminophen (NORCO/VICODIN) 5-325 MG per tablet 1 tablet  1 tablet Oral Q6H PRN Barb Merino, MD   1 tablet at 08/24/18 1814  . latanoprost (XALATAN) 0.005 % ophthalmic solution 1 drop  1 drop Both Eyes QHS Barb Merino, MD   1 drop at 08/25/18 2026  . levothyroxine (SYNTHROID, LEVOTHROID) tablet 75 mcg  75 mcg Oral QAC breakfast Barb Merino, MD   75 mcg at 08/26/18 0604  . metoprolol succinate (TOPROL-XL) 24 hr tablet 50 mg  50 mg Oral Daily Barb Merino, MD   50 mg at 08/26/18 0955  . midazolam (VERSED) injection   Intravenous PRN Jacqulynn Cadet, MD   0.5 mg at 08/25/18 1618  . morphine 2 MG/ML injection 2 mg  2 mg Intravenous Q3H PRN Barb Merino, MD   2 mg at 08/26/18 0604  . multivitamin (PROSIGHT) tablet 1 tablet  1 tablet Oral BID Barb Merino, MD   1 tablet at 08/26/18 0955  . ondansetron (ZOFRAN) tablet 4 mg  4 mg Oral Q6H PRN Barb Merino, MD       Or  . ondansetron (ZOFRAN) injection 4 mg  4 mg Intravenous Q6H PRN Barb Merino, MD      . pantoprazole (PROTONIX) EC tablet 40 mg  40 mg Oral Daily Barb Merino, MD   40 mg at 08/26/18 0954  . PARoxetine (PAXIL) tablet 20 mg  20 mg Oral Daily Barb Merino, MD   20 mg at 08/26/18 0955  . polyethylene glycol (MIRALAX / GLYCOLAX) packet 17 g  17 g Oral Daily Shelly Coss, MD   17 g at 08/26/18 0954  . [START ON 08/27/2018] potassium chloride SA (K-DUR,KLOR-CON) CR tablet 20 mEq  20 mEq Oral BID Karren Cobble, RPH      . rosuvastatin (CRESTOR) tablet 40 mg  40 mg Oral QPM Barb Merino, MD   40 mg at 08/25/18 1751  . traMADol (ULTRAM) tablet 50 mg  50 mg Oral Q12H PRN Barb Merino, MD   50 mg at 08/24/18 2043  . warfarin (COUMADIN) tablet 6 mg  6 mg Oral ONCE-1800 Alford Highland [place], [place]      . Warfarin -  Pharmacist Dosing Inpatient   Does not apply q1800 Karren Cobble, [facility]       Facility-Administered Medications Ordered in Other Encounters  Medication Dose Route Frequency Provider Last Rate Last Dose  . Zoledronic Acid (ZOMETA) 4 mg IVPB  4 mg Intravenous Once Higgs, Mathis Dad, MD         Discharge Medications: Please see discharge summary for a list of discharge medications.  Relevant Imaging Results:  Relevant Lab Results:   Additional Information  SS#[id number] Albert Lea [place], LCSWA

## 2018-08-26 NOTE — Social Work (Signed)
CSW acknowledging consult for SNF placement. Will follow for therapy recommendations.   Agatha Duplechain, MSW, LCSWA [place] Clinical Social Work [phone]   

## 2018-08-26 NOTE — Evaluation (Signed)
Physical Therapy Evaluation Patient Details Name: Jo Hughes MRN: [id number] DOB: Aug 19, 1940 Today's Date: 08/26/2018   History of Present Illness  Pt is a 78 y.o. female who presented to ED with ongoing diffuse pain, especially in her back. Diagnosis: metastatic bone tumors. PMH: persistent atrial fibrillation status post ablation, currently on Tikosyn and Coumadin, mitral valve repair, chronic diastolic heart failure, coronary artery disease hypothyroidism, symptomatic bradycardia status post pacemaker.   Clinical Impression   Pt received sitting up in chair, willing to participate in therapy. Visibly distressed about situation, and very nervous that she will be sent home to and unsafe living situation. Education provided that PT helps to decide based on mobility safe DC destination. Education about use of AD to prevent falls, and importance of preventing falls due to bone lesions causing weakness. Sit to stand transfers using RW with min guard throughout session, increased pain and unsteadiness. Ambulated in room approximately 15 ft using RW with min guard, v/c for safety with RW. Bed mobility of concern- pt reports she is unable to get in and out of bed at home, sleeps in recliner, and has difficulty getting in and out recliner as well. Flattened HOB- able to independently move from sit to supine. V/c for log roll for getting up, Pt used bed rail to assist with rolling, then required modA to move from sidelying to sitting. Noted multiple instances of cognitive deficit throughout session. Due to her functional mobility limitations, cognition impairments and lack of caregiver support at home, recommending SNF at DC to ensure pt safety. She will benefit from continued skilled PT at this time to maximize her functional mobility and ensure safety.   Follow Up Recommendations SNF;Supervision/Assistance - 24 hour(if unable to go to SNF will require 24 hour supervision )    Equipment  Recommendations  Other (comment)(defer to next venue)    Recommendations for Other Services       Precautions / Restrictions Precautions Precautions: Fall Restrictions Weight Bearing Restrictions: No      Mobility  Bed Mobility Overal bed mobility: Needs Assistance Bed Mobility: Rolling;Sidelying to Sit;Sit to Supine Rolling: Supervision Sidelying to sit: Mod assist   Sit to supine: Supervision   General bed mobility comments: Flattened HOB to get assessment of pt's bed mobility at home- she was able to independently move from sit to supine, increased time, high back pain. V/c for log roll for getting up, Pt used bed rail to assist with rolling, then required modA to move from sidelying to sitting, unable to use arms to assist, increased pain.  Transfers Overall transfer level: Needs assistance Equipment used: Rolling walker (2 wheeled) Transfers: Sit to/from Stand Sit to Stand: Min guard         General transfer comment: Pt able to stand from chair with no physical assist, use of RW, v/c for hand placement. Notable increase in pain with transfers.  Ambulation/Gait Ambulation/Gait assistance: Min guard Gait Distance (Feet): 15 Feet Assistive device: Rolling walker (2 wheeled) Gait Pattern/deviations: Step-through pattern;Decreased step length - left;Decreased step length - right;Trunk flexed Gait velocity: decreased Gait velocity interpretation: <1.8 ft/sec, indicate of risk for recurrent falls General Gait Details: Ambulated in room, pt visibly tired after short walk. Careful steps, required some v/c for safe management of RW. Utilized RW for safety, as pt stated her anxiety with moving around safely from beginning of session.  Stairs            Wheelchair Mobility    Modified Rankin (Stroke Patients  Only)       Balance Overall balance assessment: Mild deficits observed, not formally tested                                            Pertinent Vitals/Pain Pain Assessment: Faces Faces Pain Scale: Hurts whole lot Pain Location: back Pain Descriptors / Indicators: Grimacing;Guarding Pain Intervention(s): Limited activity within patient's tolerance;Monitored during session;Repositioned    Home Living Family/patient expects to be discharged to:: Private residence Living Arrangements: Alone Available Help at Discharge: Friend(s);Available PRN/intermittently Type of Home: House Home Access: Stairs to enter Entrance Stairs-Rails: None Entrance Stairs-Number of Steps: 1 step up from patio, front door has 5-6 steps Home Layout: Two level;Able to live on main level with bedroom/bathroom Home Equipment: Gilford Rile - 2 wheels;Cane - single point Additional Comments: Pt states that she has been living on the first floor of her home, sleeping in the recliner because she cannot go up the stairs. Pt states that she has a walker and a cane at home, but she is currently unable to go up the stairs.    Prior Function Level of Independence: Needs assistance   Gait / Transfers Assistance Needed: Pt states that her neighbors have been coming in and helping her get up, as she has difficulty getting up out of her recliner, and is unable to get out bed.  ADL's / Homemaking Assistance Needed: Pt reports that her friends have been writing checks for her.        Hand Dominance        Extremity/Trunk Assessment   Upper Extremity Assessment Upper Extremity Assessment: Generalized weakness    Lower Extremity Assessment Lower Extremity Assessment: Generalized weakness    Cervical / Trunk Assessment Cervical / Trunk Assessment: Kyphotic  Communication   Communication: No difficulties  Cognition Arousal/Alertness: Awake/alert Behavior During Therapy: Anxious Overall Cognitive Status: No family/caregiver present to determine baseline cognitive functioning                                 General Comments: Pt notably  very anxious about moving around and the idea of going home, which she does not feel has been a safe living situation for her. Cognition impaired, with some confusion when washing hands. She seems very overwhelmed with her diagnosis.      General Comments      Exercises     Assessment/Plan    PT Assessment Patient needs continued PT services  PT Problem List Decreased strength;Decreased balance;Decreased cognition;Pain;Decreased mobility;Decreased knowledge of use of DME;Decreased activity tolerance;Decreased coordination       PT Treatment Interventions DME instruction;Functional mobility training;Balance training;Patient/family education;Gait training;Therapeutic activities;Neuromuscular re-education;Stair training;Therapeutic exercise;Cognitive remediation    PT Goals (Current goals can be found in the Care Plan section)  Acute Rehab PT Goals Patient Stated Goal: make sure she is safe PT Goal Formulation: With patient Time For Goal Achievement: 09/09/18 Potential to Achieve Goals: Good    Frequency Min 2X/week   Barriers to discharge Inaccessible home environment;Decreased caregiver support Pt lives alone, with friends able to check on her intermittently only. Unable to access her upstairs, unable to sleep in her bed.    Co-evaluation               AM-PAC PT "6 Clicks" Mobility  Outcome Measure Help needed turning  from your back to your side while in a flat bed without using bedrails?: A Little Help needed moving from lying on your back to sitting on the side of a flat bed without using bedrails?: A Lot Help needed moving to and from a bed to a chair (including a wheelchair)?: A Little Help needed standing up from a chair using your arms (e.g., wheelchair or bedside chair)?: A Little Help needed to walk in hospital room?: A Little Help needed climbing 3-5 steps with a railing? : A Lot 6 Click Score: 16    End of Session Equipment Utilized During Treatment: Gait  belt Activity Tolerance: Patient limited by fatigue Patient left: in bed;with call bell/phone within reach;with bed alarm set Nurse Communication: Mobility status PT Visit Diagnosis: Unsteadiness on feet (R26.81);Muscle weakness (generalized) (M62.81);Pain Pain - part of body: (back)    Time:  -      Charges:             Ronnell Guadalajara, SPT   Ronnell Guadalajara 08/26/2018, 10:31 AM

## 2018-08-27 ENCOUNTER — Encounter (HOSPITAL_COMMUNITY): Payer: Self-pay | Admitting: General Practice

## 2018-08-27 LAB — BASIC METABOLIC PANEL
Anion gap: 10 (ref 5–15)
BUN: 15 mg/dL (ref 8–23)
CO2: 24 mmol/L (ref 22–32)
Calcium: 10.2 mg/dL (ref 8.9–10.3)
Chloride: 102 mmol/L (ref 98–111)
Creatinine, Ser: 1.09 mg/dL — ABNORMAL HIGH (ref 0.44–1.00)
GFR calc Af Amer: 57 mL/min — ABNORMAL LOW (ref >60)
GFR calc non Af Amer: 49 mL/min — ABNORMAL LOW (ref >60)
Glucose, Bld: 125 mg/dL — ABNORMAL HIGH (ref 70–99)
Potassium: 3.3 mmol/L — ABNORMAL LOW (ref 3.5–5.1)
Sodium: 136 mmol/L (ref 135–145)

## 2018-08-27 LAB — PROTIME-INR
INR: 1.49
Prothrombin Time: 17.8 seconds — ABNORMAL HIGH (ref 11.4–15.2)

## 2018-08-27 LAB — HEPATITIS C VRS RNA DETECT BY PCR-QUAL: HEPATITIS C VRS RNA BY PCR-QUAL: NEGATIVE

## 2018-08-27 LAB — CALCIUM, IONIZED: Calcium, Ionized, Serum: 6 mg/dL — ABNORMAL HIGH (ref 4.5–5.6)

## 2018-08-27 MED ORDER — POTASSIUM CHLORIDE CRYS ER 20 MEQ PO TBCR: 20.0000 meq | EXTENDED_RELEASE_TABLET | Freq: Two times a day (BID) | ORAL | 11 refills | Status: AC

## 2018-08-27 MED ORDER — HYDROCODONE-ACETAMINOPHEN 5-325 MG PO TABS: 1.0000 | ORAL_TABLET | Freq: Four times a day (QID) | ORAL | 0 refills | Status: AC | PRN

## 2018-08-27 MED ORDER — WARFARIN SODIUM 5 MG PO TABS: 5.0000 mg | ORAL_TABLET | Freq: Once | ORAL | Status: DC

## 2018-08-27 MED ORDER — ALPRAZOLAM 0.5 MG PO TABS: 0.5000 mg | ORAL_TABLET | Freq: Two times a day (BID) | ORAL | 0 refills | Status: AC | PRN

## 2018-08-27 MED ORDER — FUROSEMIDE 40 MG PO TABS: 40.0000 mg | ORAL_TABLET | Freq: Every evening | ORAL | Status: AC

## 2018-08-27 MED ORDER — POTASSIUM CHLORIDE CRYS ER 20 MEQ PO TBCR: 40.0000 meq | EXTENDED_RELEASE_TABLET | ORAL | Status: DC

## 2018-08-27 MED ORDER — POLYETHYLENE GLYCOL 3350 17 G PO PACK: 17.0000 g | PACK | Freq: Every day | ORAL | 0 refills | Status: AC | PRN

## 2018-08-27 NOTE — Discharge Summary (Addendum)
Physician Discharge Summary  MORIYAH BYINGTON JIR:[id number] DOB: 1941/04/15 DOA: 08/23/2018  PCP: Lavone Orn, MD  Admit date: 08/23/2018 Discharge date: 08/27/2018  Admitted From: Home Disposition:  Home  Discharge Condition:Stable CODE STATUS:FULL Diet recommendation: Heart Healthy  Brief/Interim Summary: Patient is a 78 year old female with past medical history of bradycardia status post pacemaker placement, chronic persistent A. fib, status post ablation on 07/27/2018, CHF with preserved ejection fraction, coronary disease who presented from home with several days of diffuse body pain.  She was found to have hypercalcemia.  Imaging showed elevated left ribs concerning for malignancy, large mass in the right lobe of the liver, multiple skeletal lytic lesions concerning for metastatic disease of possible hepatocellular carcinoma cholangiocarcinoma.  Underwent liver biopsy on 08/25/18.  Oncology consulted. Physical therapy evaluated her and recommended skilled services at discharge.  She is hemodynamically stable for discharge today.  Following problems were addressed during her hospitalization:  Large right liver mass/multiple lytic lesions of the skeleton: Concerning for hepatocellular carcinoma or cholangiocarcinoma with metastatic disease.  Underwent liver biopsy by IR.Biopsy report pending. Oncology consulted.  She will follow-up with oncology as an outpatient.  Elevated AFP and CEA.  Hepatitis panel negative.  Elevated LFTs: Secondary to primary versus metastatic disease of the liver.  Patient complains of diffuse generalized abdominal pain.  Persistent atrial fibrillation: Currently rate is controlled.  Status post ablation in January.  On Tikosyn.  On warfarin at home.  She had to be given vitamin K to lower the INR for biopsy. Was on heparin drip for anticoagulation.  She follows with cardiology as an outpatient.  She also has a pacemaker.  Will restart warfarin.  Check INR in 2 to 3  days.  Acute on chronic hypercalcemia: Elevated baseline calcium at 10.5-10.8.  Today calcium level is 10.2.  She was given a dose of Zometa   Generalized body aches/generalized weakness: Seen by physical therapy and recommended skilled nursing facility on discharge.    Chronic congestive heart failure with preserved ejection fraction: Currently euvolemic.  Continue home Lasix at 40 mg daily.   Hypothyroidism: Currently stable.  Continue Synthyroid  GERD/hyperlipidemia: Continue Protonix/Crestor  Episodes of confusion: Currently alert and oriented.  CT of the brain with contrast did not show any metastatic process.  Hypokalemia: Continue potassium supplementation.  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[place]) Active Problems:   Atypical atrial flutter ([facility])   Pacemaker-[employer]   Status post mitral valve repair   Chronic diastolic heart failure ([facility])   Hyperlipidemia   Hypothyroid   Persistent atrial fibrillation   Metastatic cancer to bone (H[facility])   Hypercalcemia   Liver mass, right lobe   Diffuse pain   Lytic bone lesions on xray   Elevated LFTs   Malnutrition of moderate degree    Discharge Instructions  Discharge Instructions    Diet - low sodium heart healthy   Complete by:  As directed    Discharge instructions   Complete by:  As directed    1)You will be called by oncology for the follow-up. 2)Take prescribed medications as instructed. 3)Check your INR in 2 days.Do CBC, BMP tests in a week   Increase activity slowly   Complete by:  As directed      Allergies as of 08/27/2018      Reactions   Citalopram Hydrobromide Other (See Comments)   Headaches and insomnia   Trazodone And Nefazodone Other (See Comments)  Weakness   Liver Nausea And Vomiting      Medication List    TAKE these medications   ALPRAZolam 0.5 MG tablet Commonly known as:  XANAX Take 1 tablet (0.5 mg total) by mouth 2 (two)  times daily as needed for anxiety.   amoxicillin 500 MG capsule Commonly known as:  AMOXIL Take 2,000 mg by mouth See admin instructions. Take four 4 capsules (2000 mg) by mouth one (1) hour prior to dental procedures.   colchicine 0.6 MG tablet Take 1 tablet (0.6 mg total) by mouth 2 (two) times daily.   cycloSPORINE 0.05 % ophthalmic emulsion Commonly known as:  RESTASIS Place 1 drop into both eyes 2 (two) times daily.   dofetilide 125 MCG capsule Commonly known as:  TIKOSYN Take 1 capsule by mouth twice daily along with a 250 mcg capsule What changed:    how much to take  how to take this  when to take this  additional instructions   dofetilide 250 MCG capsule Commonly known as:  TIKOSYN Take 1 capsule by mouth twice daily along with a 125 mcg capsule What changed:    how much to take  how to take this  when to take this  additional instructions   dorzolamide-timolol 22.3-6.8 MG/ML ophthalmic solution Commonly known as:  COSOPT Place 1 drop into both eyes 2 (two) times daily as needed (for dryness).   Fish Oil 1000 MG Caps Take 1,000 mg by mouth 2 (two) times daily.   furosemide 40 MG tablet Commonly known as:  LASIX Take 1 tablet (40 mg total) by mouth every evening. What changed:    medication strength  how much to take   HYDROcodone-acetaminophen 5-325 MG tablet Commonly known as:  NORCO/VICODIN Take 1 tablet by mouth every 6 (six) hours as needed for moderate pain or severe pain.   ICAPS Caps Take 1 capsule by mouth 2 (two) times daily.   latanoprost 0.005 % ophthalmic solution Commonly known as:  XALATAN Place 1 drop into both eyes at bedtime.   metoprolol succinate 25 MG 24 hr tablet Commonly known as:  TOPROL-XL TAKE 2 TABLETS BY MOUTH ONCE DAILY.   naproxen sodium 220 MG tablet Commonly known as:  ALEVE Take 220-440 mg by mouth 2 (two) times daily as needed (for headaches).   pantoprazole 40 MG tablet Commonly known as:   PROTONIX Take 1 tablet (40 mg total) by mouth daily.   PARoxetine 20 MG tablet Commonly known as:  PAXIL Take 1 tablet (20 mg total) by mouth daily.   polyethylene glycol packet Commonly known as:  MIRALAX / GLYCOLAX Take 17 g by mouth daily as needed for moderate constipation.   potassium chloride SA 20 MEQ tablet Commonly known as:  K-DUR,KLOR-CON Take 1 tablet (20 mEq total) by mouth 2 (two) times daily.   rosuvastatin 40 MG tablet Commonly known as:  CRESTOR Take 40 mg by mouth every evening.   SYNTHROID 75 MCG tablet Generic drug:  levothyroxine Take 75 mcg by mouth daily before breakfast.   traMADol 50 MG tablet Commonly known as:  ULTRAM Take 50 mg by mouth every 12 (twelve) hours as needed for moderate pain.   warfarin 5 MG tablet Commonly known as:  COUMADIN Take as directed. If you are unsure how to take this medication, talk to your nurse or doctor. Original instructions:  TAKE AS DIRECTED BY COUMADIN CLINIC. What changed:  See the new instructions.   ZIOPTAN 0.0015 % Soln Generic drug:  Tafluprost (PF) Place 1 drop into both eyes at bedtime.   zolmitriptan 5 MG tablet Commonly known as:  ZOMIG Take 5 mg by mouth daily as needed for migraine.      Contact information for after-discharge care    Destination    [facility] Preferred SNF .   Service:  Skilled Nursing Contact information: [place] [phone]             Allergies  Allergen Reactions  . Citalopram Hydrobromide Other (See Comments)    Headaches and insomnia  . Trazodone And Nefazodone Other (See Comments)    Weakness  . Liver Nausea And Vomiting    Consultations:  Oncology, IR   Procedures/Studies: Dg Chest 2 View  Result Date: 08/23/2018 CLINICAL DATA:  Chest and back pain EXAM: CHEST - 2 VIEW COMPARISON:  08/31/2013 FINDINGS: Normal heart size. Prior median sternotomy. Dual-chamber pacer leads in stable  position. Trace pleural fluid. No pulmonary edema or air bronchogram. No pneumothorax. Posterior left fourth rib appears to be newly eroded. This area was not covered on a January 2020 CT. IMPRESSION: The posterior left fourth rib is diffusely eroded, concerning for an aggressive/malignant process. Recommend chest CT with contrast if possible. Electronically Signed   By: Monte Fantasia M.D.   On: 08/23/2018 07:19   Ct Head W & Wo Contrast  Result Date: 08/25/2018 CLINICAL DATA:  Initial evaluation for encephalopathy, metastatic workup. EXAM: CT HEAD WITHOUT AND WITH CONTRAST TECHNIQUE: Contiguous axial images were obtained from the base of the skull through the vertex without and with intravenous contrast CONTRAST:  18mL OMNIPAQUE IOHEXOL 300 MG/ML  SOLN COMPARISON:  Prior MRI from 10/28/2010. FINDINGS: Brain: Generalized age-related cerebral atrophy with mild chronic small vessel ischemic disease. No acute intracranial hemorrhage. No acute large vessel territory infarct. No mass lesion, midline shift, or mass effect. No hydrocephalus. No extra-axial fluid collection. No abnormal enhancement seen with contrast administration. No findings to suggest metastatic disease. Vascular: Normal intravascular enhancement seen throughout the intracranial circulation. Scattered vascular calcifications noted within the carotid siphons. Skull: Scalp soft tissues demonstrate no acute finding. Calvarium intact. No focal osseous lesions. Sinuses/Orbits: Globes and orbital soft tissues within normal limits. Mild mucosal thickening within the left ethmoidal air cells. Paranasal sinuses and mastoid air cells are otherwise clear. Other: None. IMPRESSION: 1. No acute intracranial abnormality. No abnormal enhancement or evidence for metastatic disease. 2. Age-related cerebral atrophy with mild chronic small vessel ischemic disease. Electronically Signed   By: Jeannine Boga M.D.   On: 08/25/2018 16:48   Ct Chest W  Contrast  Result Date: 08/23/2018 CLINICAL DATA:  Recent atrial ablation, persistent back and chest discomfort, abnormal chest radiograph with left fourth rib lesion EXAM: CT CHEST, ABDOMEN, AND PELVIS WITH CONTRAST TECHNIQUE: Multidetector CT imaging of the chest, abdomen and pelvis was performed following the standard protocol during bolus administration of intravenous contrast. CONTRAST:  127mL OMNIPAQUE IOHEXOL 300 MG/ML  SOLN COMPARISON:  Same day chest radiograph, CT abdomen pelvis, 09/01/2013 FINDINGS: CT CHEST FINDINGS Cardiovascular: Cardiomegaly with left chest multi lead pacer defibrillator and evidence of atrial ablation. No pericardial effusion. Mediastinum/Nodes: No enlarged mediastinal, hilar, or axillary lymph nodes. Thyroid gland, trachea, and esophagus demonstrate no significant findings. Lungs/Pleura: Lungs are clear.  Trace pleural effusion. Musculoskeletal: There are destructive, expansile lesions of the left fourth and fifth ribs, in keeping with findings of prior radiograph. There are numerous lytic lesions of the thoracic spine and a superior endplate  fracture associated with a lytic lesion of the T11 vertebral body (series 7, image 85). CT ABDOMEN PELVIS FINDINGS Hepatobiliary: There is a large, hypodense mass lesion of the central inferior right lobe of the liver, centered in segment V, with numerous additional small hypodense lesions. Tiny gallstones in the gallbladder. Pancreas: Unremarkable. No pancreatic ductal dilatation or surrounding inflammatory changes. Spleen: Normal in size without focal abnormality. Adrenals/Urinary Tract: Adrenal glands are unremarkable. Kidneys are normal, without renal calculi, focal lesion, or hydronephrosis. Bladder is unremarkable. Stomach/Bowel: Stomach is within normal limits. Appendix appears normal. No evidence of bowel wall thickening, distention, or inflammatory changes. Vascular/Lymphatic: No significant vascular findings are present. No enlarged  abdominal or pelvic lymph nodes. Reproductive: No mass or other abnormality. Status post hysterectomy. Other: No abdominal wall hernia or abnormality. No abdominopelvic ascites. Musculoskeletal: Numerous lytic lesions of the lumbar spine pelvis, and proximal femurs. IMPRESSION: 1. There is a large, hypodense mass lesion of the central inferior right lobe of the liver, centered in segment V, with numerous additional small hypodense lesions. 2. Numerous lytic lesions of the included skeleton, particularly including left rib lesions corresponding to radiographic findings, a superior endplate fracture associated with a lesion of the T11 vertebral body and lesions involving the pelvis and bilateral proximal femurs. 3. 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 4.  Other chronic and incidental findings as detailed above. Electronically Signed   By: Eddie Candle M.D.   On: 08/23/2018 08:47   Ct Abdomen Pelvis W Contrast  Result Date: 08/23/2018 CLINICAL DATA:  Recent atrial ablation, persistent back and chest discomfort, abnormal chest radiograph with left fourth rib lesion EXAM: CT CHEST, ABDOMEN, AND PELVIS WITH CONTRAST TECHNIQUE: Multidetector CT imaging of the chest, abdomen and pelvis was performed following the standard protocol during bolus administration of intravenous contrast. CONTRAST:  167mL OMNIPAQUE IOHEXOL 300 MG/ML  SOLN COMPARISON:  Same day chest radiograph, CT abdomen pelvis, 09/01/2013 FINDINGS: CT CHEST FINDINGS Cardiovascular: Cardiomegaly with left chest multi lead pacer defibrillator and evidence of atrial ablation. No pericardial effusion. Mediastinum/Nodes: No enlarged mediastinal, hilar, or axillary lymph nodes. Thyroid gland, trachea, and esophagus demonstrate no significant findings. Lungs/Pleura: Lungs are clear.  Trace pleural effusion. Musculoskeletal: There are destructive, expansile  lesions of the left fourth and fifth ribs, in keeping with findings of prior radiograph. There are numerous lytic lesions of the thoracic spine and a superior endplate fracture associated with a lytic lesion of the T11 vertebral body (series 7, image 85). CT ABDOMEN PELVIS FINDINGS Hepatobiliary: There is a large, hypodense mass lesion of the central inferior right lobe of the liver, centered in segment V, with numerous additional small hypodense lesions. Tiny gallstones in the gallbladder. Pancreas: Unremarkable. No pancreatic ductal dilatation or surrounding inflammatory changes. Spleen: Normal in size without focal abnormality. Adrenals/Urinary Tract: Adrenal glands are unremarkable. Kidneys are normal, without renal calculi, focal lesion, or hydronephrosis. Bladder is unremarkable. Stomach/Bowel: Stomach is within normal limits. Appendix appears normal. No evidence of bowel wall thickening, distention, or inflammatory changes. Vascular/Lymphatic: No significant vascular findings are present. No enlarged abdominal or pelvic lymph nodes. Reproductive: No mass or other abnormality. Status post hysterectomy. Other: No abdominal wall hernia or abnormality. No abdominopelvic ascites. Musculoskeletal: Numerous lytic lesions of the lumbar spine pelvis, and proximal femurs. IMPRESSION: 1. There is a large, hypodense mass lesion of the central inferior right lobe of the liver, centered in segment V, with numerous additional small hypodense lesions. 2.  Numerous lytic lesions of the included skeleton, particularly including left rib lesions corresponding to radiographic findings, a superior endplate fracture associated with a lesion of the T11 vertebral body and lesions involving the pelvis and bilateral proximal femurs. 3. 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 4.  Other chronic and incidental findings as detailed  above. Electronically Signed   By: Eddie Candle M.D.   On: 08/23/2018 08:47   US Biopsy (liver)  Result Date: 08/25/2018 INDICATION: 78 year old female with recently diagnosed multiple liver lesions and bone lesions concerning for metastatic disease of uncertain primary. Possible cholangiocarcinoma or hepatocellular carcinoma. She presents for ultrasound-guided core biopsy of liver lesion to confirm tissue diagnosis EXAM: Ultrasound-guided core biopsy of liver lesion MEDICATIONS: None. ANESTHESIA/SEDATION: Moderate (conscious) sedation was employed during this procedure. A total of Versed 1.5 mg and Fentanyl 50 mcg was administered intravenously. Moderate Sedation Time: 16 minutes. The patient's level of consciousness and vital signs were monitored continuously by radiology nursing throughout the procedure under my direct supervision. FLUOROSCOPY TIME:  None COMPLICATIONS: None immediate. PROCEDURE: Informed written consent was obtained from the patient after a thorough discussion of the procedural risks, benefits and alternatives. All questions were addressed. Maximal Sterile Barrier Technique was utilized including caps, mask, sterile gowns, sterile gloves, sterile drape, hand hygiene and skin antiseptic. A timeout was performed prior to the initiation of the procedure. The liver was interrogated with ultrasound. A suitable 1.3 x 1.5 cm hypoechoic lesion was identified in the central right hepatic lobe. A suitable skin entry site was selected and marked. The overlying skin was sterilely prepped and draped in standard fashion using chlorhexidine skin prep. Local anesthesia was attained by infiltration with 1% lidocaine. A small dermatotomy was made. Under real-time sonographic guidance, a 17 gauge introducer needle was advanced to the margin of the mass. Multiple 18 gauge core biopsies were then obtained using the bio Pince automated biopsy device. Biopsy specimens were placed in formalin and delivered to  pathology for further analysis. As the introducer needle was removed, the biopsy tract was embolized with a Gel-Foam slurry. Post biopsy ultrasound images demonstrate no immediate complication. IMPRESSION: Technically successful ultrasound-guided core biopsy of right hepatic lesion. Electronically Signed   By: Jacqulynn Cadet M.D.   On: 08/25/2018 16:52      Subjective:  Patient seen and examined the bedside this morning.  Hemodynamically stable.  Working with physical therapy.  Complains of diffuse body ache. Discharge Exam: Vitals:   08/26/18 2052 08/27/18 0642  BP: (!) 151/63 (!) 147/65  Pulse: 62 64  Resp: 17 17  Temp: 97.7 F (36.5 C) 97.9 F (36.6 C)  SpO2: 99% 98%   Vitals:   08/26/18 0502 08/26/18 1347 08/26/18 2052 08/27/18 0642  BP: (!) 159/65 123/81 (!) 151/63 (!) 147/65  Pulse: 62 60 62 64  Resp: 16 17 17 17   Temp: 98.2 F (36.8 C) 98 F (36.7 C) 97.7 F (36.5 C) 97.9 F (36.6 C)  TempSrc: Oral Oral Oral Oral  SpO2: 98% 94% 99% 98%  Weight:      Height:        General: Pt is alert, awake,weak Cardiovascular: RRR, S1/S2 +, no rubs, no gallops Respiratory: CTA bilaterally, no wheezing, no rhonchi Abdominal: Soft, NT, ND, bowel sounds + Extremities: no edema, no cyanosis    The results of significant diagnostics from this hospitalization (including imaging, microbiology, ancillary and laboratory) are listed below for reference.  Microbiology: Recent Results (from the past 240 hour(s))  Urine culture     Status: Abnormal   Collection Time: 08/23/18 10:32 AM  Result Value Ref Range Status   Specimen Description URINE, RANDOM  Final   Special Requests NONE  Final   Culture (A)  Final    60,000 COLONIES/mL GROUP B STREP(S.AGALACTIAE)ISOLATED TESTING AGAINST S. AGALACTIAE NOT ROUTINELY PERFORMED DUE TO PREDICTABILITY OF AMP/PEN/VAN SUSCEPTIBILITY. Performed at [facility], [place] [address] [place], [place] [address]    Report Status 08/24/2018  FINAL  Final     Labs: BNP (last 3 results) No results for input(s): BNP in the last 8760 hours. Basic Metabolic Panel: Recent Labs  Lab 08/23/18 0626 08/24/18 0923 08/25/18 0206 08/26/18 0328 08/27/18 1018  NA 138 138 141 139 136  K 3.8 3.8 3.6 3.7 3.3*  CL 99 105 104 107 102  CO2 23 23 24  21* 24  GLUCOSE 146* 113* 112* 111* 125*  BUN 21 18 17 18 15   CREATININE 1.12* 1.03* 0.98 0.95 1.09*  CALCIUM 11.9* 11.3* 11.0* 10.7* 10.2  MG  --   --  1.9  --   --    Liver Function Tests: Recent Labs  Lab 08/23/18 0645 08/24/18 0923 08/25/18 0206 08/26/18 0328  AST 103* 78* 66* 65*  ALT 88* 68* 62* 52*  ALKPHOS 388* 352* 334* 325*  BILITOT 1.0 1.2 1.0 0.8  PROT 7.6 6.0* 6.1* 5.6*  ALBUMIN 3.5 2.8* 2.9* 2.7*   Recent Labs  Lab 08/23/18 0645  LIPASE 40   No results for input(s): AMMONIA in the last 168 hours. CBC: Recent Labs  Lab 08/23/18 0626 08/25/18 0206 08/26/18 0328  WBC 9.1 9.4 8.9  HGB 15.1* 13.9 12.2  HCT 45.6 42.6 38.2  MCV 82.5 80.4 82.0  PLT 193 175 150   Cardiac Enzymes: No results for input(s): CKTOTAL, CKMB, CKMBINDEX, TROPONINI in the last 168 hours. BNP: Invalid input(s): POCBNP CBG: No results for input(s): GLUCAP in the last 168 hours. D-Dimer No results for input(s): DDIMER in the last 72 hours. Hgb A1c No results for input(s): HGBA1C in the last 72 hours. Lipid Profile No results for input(s): CHOL, HDL, LDLCALC, TRIG, CHOLHDL, LDLDIRECT in the last 72 hours. Thyroid function studies No results for input(s): TSH, T4TOTAL, T3FREE, THYROIDAB in the last 72 hours.  Invalid input(s): FREET3 Anemia work up No results for input(s): VITAMINB12, FOLATE, FERRITIN, TIBC, IRON, RETICCTPCT in the last 72 hours. Urinalysis    Component Value Date/Time   COLORURINE YELLOW 08/23/2018 1151   APPEARANCEUR CLEAR 08/23/2018 1151   LABSPEC >1.046 (H) 08/23/2018 1151   PHURINE 5.0 08/23/2018 1151   GLUCOSEU NEGATIVE 08/23/2018 1151   HGBUR SMALL  (A) 08/23/2018 1151   BILIRUBINUR NEGATIVE 08/23/2018 1151   KETONESUR NEGATIVE 08/23/2018 1151   PROTEINUR 30 (A) 08/23/2018 1151   UROBILINOGEN 0.2 09/01/2013 0110   NITRITE NEGATIVE 08/23/2018 1151   LEUKOCYTESUR NEGATIVE 08/23/2018 1151   Sepsis Labs Invalid input(s): PROCALCITONIN,  WBC,  LACTICIDVEN Microbiology Recent Results (from the past 240 hour(s))  Urine culture     Status: Abnormal   Collection Time: 08/23/18 10:32 AM  Result Value Ref Range Status   Specimen Description URINE, RANDOM  Final   Special Requests NONE  Final   Culture (A)  Final    60,000 COLONIES/mL GROUP B STREP(S.AGALACTIAE)ISOLATED TESTING AGAINST S. AGALACTIAE NOT ROUTINELY PERFORMED DUE TO PREDICTABILITY OF AMP/PEN/VAN SUSCEPTIBILITY. Performed at [facility], [place] [address],  [place], [place] [address]    Report Status 08/24/2018 FINAL  Final    Please note: You were cared for by a hospitalist during your hospital stay. Once you are discharged, your primary care physician will handle any further medical issues. Please note that NO REFILLS for any discharge medications will be authorized once you are discharged, as it is imperative that you return to your primary care physician (or establish a relationship with a primary care physician if you do not have one) for your post hospital discharge needs so that they can reassess your need for medications and monitor your lab values.    Time coordinating discharge: 40 minutes  SIGNED:   Shelly Coss, MD  [facility] 08/27/2018, 1:44 PM Pager [phone]  If 7PM-7AM, please contact night-coverage [email or web address] Password [email or web address]

## 2018-08-27 NOTE — Progress Notes (Signed)
ANTICOAGULATION CONSULT NOTE  Pharmacy Consult for warfarin Indication: atrial fibrillation  Patient Measurements: Height: 5\' 4"  (162.6 cm) Weight: 122 lb 2.2 oz (55.4 kg) IBW/kg (Calculated) : 54.7 Heparin Dosing Weight: 55.4kg  Vital Signs: Temp: 97.9 F (36.6 C) (02/14 0642) Temp Source: Oral (02/14 0642) BP: 147/65 (02/14 0642) Pulse Rate: 64 (02/14 0642)  Labs: Recent Labs    08/25/18 0206 08/26/18 0328 08/26/18 0702 08/27/18 0303  HGB 13.9 12.2  --   --   HCT 42.6 38.2  --   --   PLT 175 150  --   --   LABPROT 18.6* 16.4*  --  17.8*  INR 1.57 1.33  --  1.49  HEPARINUNFRC  --   --  0.11*  --   CREATININE 0.98 0.95  --   --     Assessment:  Warfarin prior to admission for AFib. Patient was on heparin infusion while being worked up for a liver mass. Now s/p biopsy, warfarin was resumed yesterday evening. Also on Tikosyn here and prior to admission. Current INR is 1.49. Patient received Vitamin K on 2/11 which may result in a longer time to therapeutic INR.  PTA dose: 2.5 mg TuThSun, 5 mg MWFSat  Goal of Therapy:  INR 2-3 Monitor platelets by anticoagulation protocol: Yes   Plan:  -Warfarin 5 mg po x1 -Daily INR   Harvel Quale 08/27/2018,9:52 AM

## 2018-08-27 NOTE — Clinical Social Work Placement (Signed)
   CLINICAL SOCIAL WORK PLACEMENT  NOTE [facility]  Date:  08/27/2018  Patient Details  Name: Jo Hughes MRN: [id number] Date of Birth: Oct 01, 1940  Clinical Social Work is seeking post-discharge placement for this patient at the [place] level of care (*CSW will initial, date and re-position this form in  chart as items are completed):  Yes   Patient/family provided with [place] Work Department's list of facilities offering this level of care within the geographic area requested by the patient (or if unable, by the patient's family).  Yes   Patient/family informed of their freedom to choose among providers that offer the needed level of care, that participate in Medicare, Medicaid or managed care program needed by the patient, have an available bed and are willing to accept the patient.  Yes   Patient/family informed of [facility]'s ownership interest in [facility] and [facility], as well as of the fact that they are under no obligation to receive care at these facilities.  PASRR submitted to [facility] on 08/26/18     PASRR number received on 08/27/18     Existing PASRR number confirmed on       FL2 transmitted to all facilities in geographic area requested by pt/family on 08/26/18     FL2 transmitted to all facilities within larger geographic area on       Patient informed that his/her managed care company has contracts with or will negotiate with certain facilities, including the following:        Yes   Patient/family informed of bed offers received.  Patient chooses bed at [facility]     Physician recommends and patient chooses bed at      Patient to be transferred to [facility] on 08/27/18.  Patient to be transferred to facility by [facility]     Patient family notified on 08/27/18 of transfer.  Name of family member notified:  pt brother and pt cousin via telephone     PHYSICIAN Please prepare  priority discharge summary, including medications, Please prepare prescriptions     Additional Comment:    _______________________________________________ Alexander Mt, LCSWA 08/27/2018, 10:22 AM

## 2018-08-27 NOTE — Social Work (Signed)
Pt in agreement with plan, pt does have some hesitation about being discharged prior to receiving all the results of her biopsy. She inquired as to if she asked to not leave if she could stay. We discussed that medical readiness is dependent on the physician and that it would be up to him as to whether or not she was ready.   Pt requested CSW call cousin. Called cousin Jocelyn Lamer, provided her with update on disposition and requested her to complete paperwork. Pt cousin unable to due to watching her grandchildren. Jocelyn Lamer also asked CSW to call pt brother.  CSW called pt brother Eddie Dibbles, provided disposition update, pt brother also unable to support paperwork completion due to watching grandchildren.   Spoke with facility, we will find alternative way to complete paperwork.  Westley Hummer, MSW, [facility] [place] Work [phone]

## 2018-08-27 NOTE — Plan of Care (Signed)
  Problem: Clinical Measurements: Goal: Ability to maintain clinical measurements within normal limits will improve Outcome: Progressing   Problem: Coping: Goal: Level of anxiety will decrease Outcome: Not Progressing   Problem: Pain Managment: Goal: General experience of comfort will improve Outcome: Progressing   

## 2018-08-27 NOTE — Progress Notes (Signed)
Pt for discharge going to [facility] report given to [place], given health teachinngs, next appt, due med, prescription given, also given all her personal belongings, given pain meds and anxiety med this am, no s/s of distress noted.

## 2018-08-27 NOTE — Social Work (Signed)
Clinical Social Worker facilitated patient discharge including contacting patient family and facility to confirm patient discharge plans.  Clinical information faxed to facility and family agreeable with plan.  CSW arranged ambulance transport via [facility] to [facility].  RN to call [phone]  with report prior to discharge.  Clinical Social Worker will sign off for now as social work intervention is no longer needed. Please consult us again if new need arises.  Musette Kisamore, MSW, LCSWA Clinical Social Worker [phone]  

## 2018-08-30 ENCOUNTER — Ambulatory Visit (HOSPITAL_COMMUNITY): Payer: Medicare Other | Admitting: Nurse Practitioner

## 2018-09-06 ENCOUNTER — Telehealth: Payer: Self-pay | Admitting: Hematology

## 2018-09-06 NOTE — Telephone Encounter (Signed)
A new patient appt has been scheduled for the pt to see Dr. Burr Medico on 2/25 at 10:15am. Pt is currently a resident at [facility]. I spoke to Lennette Bihari, Solicitor at the facility and made him aware of the appt as well as the pt. Both aware to arrive 30 minutes early to be checked in on time.

## 2018-09-06 NOTE — Progress Notes (Signed)
Jo Hughes   [phone] Fax:([phone]) [place] Note   Patient Care Team: Lavone Orn, MD as PCP - General (Internal Medicine) Thompson Grayer, MD as PCP - Electrophysiology (Cardiology) Belva Crome, MD as PCP - Cardiology (Cardiology) 09/07/2018  Referring Provider Dr. 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 There is a large, hypodense mass lesion of the central inferior right lobe of the liver, centered in segment V, with numerous additional small hypodense lesions.  2. Numerous lytic lesions of the included skeleton, particularly including left rib lesions corresponding to radiographic findings, a superior endplate fracture associated with a lesion of the T11 vertebral body and lesions involving the pelvis and bilateral proximal femurs.  3. 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     08/25/2018 Pathology Results    Liver, needle/core biopsy, Liver Mass - MALIGNANT EPITHELIAL NEOPLASM MOST CONSISTENT WITH HEPATOCELLULAR CARCINOMA. SEE NOTE. Diagnosis Note Tumor shows a mostly trabecular architecture with focal possible bile production. Immunostains show that the tumor cells are positive for glypican-3 (moderate staining) and CK 7 (focal labeling). The tumor cells are negative for Hep Par, arginase-1, CK 20 and CDX2. This immunoprofile is most consistent with the above interpretation    08/25/2018 Cancer Staging    Staging form: Liver, AJCC 8th Edition - Clinical stage from 08/25/2018: Stage IVB (cT3, cN0, cM1) - Signed by Truitt Merle, MD on 09/06/2018     HISTORY OF PRESENTING ILLNESS: Jo Hughes 78 y.o. female is here because of a newly diagnosed [facility].  She was seen by my colleague Dr. Walden Field in the hospital a few weeks ago, and today is her first follow up with [place] in our office.she presents to the clinic by herself in a wheelchair.     She presented to the ED on 08/23/2018 due to several days of diffuse body pain since her ablation. Specifically, she had constant pain in her lower back that diffused to her hips and rated in severity 10/10. She felt fatigue, weakness on both legs, and constipation. Denied any problem with her bladder. Her pain worsen in severity and she decided to call an ambulance.    At the ED she had an CXR was performed which showed that the posterior left fourth rib was diffusely eroded. Then a CT CAP was performed and it showed large right liver mass/multiple lytic lesions of the skeleton. She underwent a US guided core biopsy liver lesion in right hemiliver on 08/25/18 and it confirmed hepatocellular carcinoma cholangiocarcinoma.  She was seen by Dr. Walden Field in the hospital. Then was referred to follow-up with me and was discharged from the hospital on 08/27/2018.  She is currently residing at [facility] [place]. Her back pain and hip pain has not improved since her hospital visit. She takes oxycodone 4-5 times a day for the pain and takes hydrocodone if the pain is extremely severe. She is able to ambulate with a cane but walks very slow and  short distance. She has a small appetite, and has lost about 20lbs. Denies stomach pain, head ache, cp, or sob.   Before her heart ablation she was not able to do things on her own for about 2-3 months due to fatigue and pain. She has trouble remembering past events. She is currently going through a divorce and her  brother is out of town. Doneta Public and Eliseo Squires are her close friend that complete chores for her and know her well. I called Doneta Public to discuss her medical condition. She stated that her memory has recently occurred.   She has a past medical history of bradycardia status post pacemaker placement, chronic persistent A. fib, status post ablation on 07/27/2018, CHF, CAD, s/p mitral and tricuspid valve repair.   Pertinent positives and negatives of review of systems are listed and detailed within the above HPI.  MEDICAL HISTORY:  Past Medical History:  Diagnosis Date  . Anxiety   . Atrial fibrillation ([place])    persistent  . CAD (coronary artery disease)   . Chronic diastolic CHF (congestive heart failure) ([place])   . 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 Glaucoma   . H/O mitral valve repair 2005   [place].  . H/O tricuspid valve repair   . Hx of acquired endocarditis 1970; 1986   Prior to mitral valve repair   . Hyperlipidemia    cardiac cath clean coronary arties in the past.  . Hypothyroid   . Insomnia 12/11/2015  . Kidney stone 10/2013   "pass it"  . Liver mass 08/2018  . Long term (current) use of anticoagulants    No bleeding  . Macular degeneration    early , [place]  . OSA (obstructive sleep apnea) 08/14/2015   Mild with AHI 7/hr  . Symptomatic bradycardia    s/p PPM  . Thyroid nodule     SURGICAL HISTORY: Past Surgical History:  Procedure Laterality Date  . ABDOMINAL HYSTERECTOMY  1986  . ATRIAL FIBRILLATION ABLATION N/A 11/08/2013   Procedure: ATRIAL FIBRILLATION ABLATION;  Surgeon: Coralyn Mark, MD;  Location: [facility] CATH LAB;  Service: Cardiovascular;  Laterality: N/A;  . ATRIAL FIBRILLATION ABLATION N/A 07/27/2018   Procedure: ATRIAL FIBRILLATION ABLATION;  Surgeon: Thompson Grayer, MD;  Location: [facility] CV LAB;  Service: Cardiovascular;  Laterality: N/A;  . BACK SURGERY    . BREAST BIOPSY Left X 2   "benign"  .  CARDIAC CATHETERIZATION  X 3  . CARDIOVERSION  ~ 2011; 09/18/2011   ?; Procedure: CARDIOVERSION;  Surgeon: Sinclair Grooms, MD;  Location: [facility];  Service: Cardiovascular;  Laterality: N/A;  . CARDIOVERSION N/A 04/29/2013   Procedure: CARDIOVERSION;  Surgeon: Sinclair Grooms, MD;  Location: [facility];  Service: Cardiovascular;  Laterality: N/A;  . CATARACT EXTRACTION W/ INTRAOCULAR LENS  IMPLANT, BILATERAL  ~ 2011  . DILATION AND CURETTAGE OF UTERUS    . INSERT / REPLACE / REMOVE PACEMAKER  01/13/2012   MDT Adapta L implanted by Dr Rayann Heman  . KNEE CARTILAGE SURGERY Left 02/2009   scope; Dr. Berenice Primas  . LUMBAR LAMINECTOMY  1997  . MITRAL VALVE REPAIR  05/2004   [facility]  . PERMANENT PACEMAKER INSERTION N/A 01/13/2012   Procedure: PERMANENT PACEMAKER INSERTION;  Surgeon: Thompson Grayer, MD;  Location: [facility] CATH LAB;  Service: Cardiovascular;  Laterality: N/A;  . TEE WITHOUT CARDIOVERSION N/A 11/07/2013   Procedure: TRANSESOPHAGEAL ECHOCARDIOGRAM (TEE);  Surgeon: Pixie Casino, MD;  Location: [facility] ENDOSCOPY;  Service: Cardiovascular;  Laterality: N/A;  . TONSILLECTOMY  1948  . TRICUSPID VALVE SURGERY  05/2004   "repair; [facility]"    SOCIAL HISTORY: Social History   Socioeconomic History  . Marital status: Legally Separated    Spouse name: Not on file  . Number of children: Not on file  . Years of education: Not on file  . Highest education level: Not on file  Occupational History  . Not on file  Social Needs  . Financial resource strain: Not on file  . Food insecurity:    Worry: Not on file    Inability: Not on file  . Transportation needs:    Medical: Not on file    Non-medical: Not on file  Tobacco Use  . Smoking status: Former Smoker    Packs/day: 1.00    Years: 24.00    Pack years: 24.00    Types: Cigarettes    Last attempt to quit: 07/15/1987    Years since quitting: 31.1  . Smokeless tobacco: Never Used  Substance and Sexual Activity  . Alcohol use: Yes      Comment: 07/27/2018 "probably 2 glasses of wine/month  . Drug use: No  . Sexual activity: Not Currently  Lifestyle  . Physical activity:    Days per week: Not on file    Minutes per session: Not on file  . Stress: Not on file  Relationships  . Social connections:    Talks on phone: Not on file    Gets together: Not on file    Attends religious service: Not on file    Active member of club or organization: Not on file    Attends meetings of clubs or organizations: Not on file    Relationship status: Not on file  . Intimate partner violence:    Fear of current or ex partner: Not on file    Emotionally abused: Not on file    Physically abused: Not on file    Forced sexual activity: Not on file  Other Topics Concern  . Not on file  Social History Narrative   Lives in [place] with spouse.  No children.  Retired Education officer, museum.    FAMILY HISTORY: Family History  Problem Relation Age of Onset  . Stroke Father   . Heart attack Father 14  . Heart disease Father   . Hyperlipidemia Father   . Hypertension Father   . Macular degeneration Mother   . Transient ischemic attack Mother        multiple  . Varicose Veins Mother   . Hypertension Brother     ALLERGIES:  is allergic to citalopram hydrobromide; trazodone and nefazodone; and liver.  MEDICATIONS:  Current Outpatient Medications  Medication Sig Dispense Refill  . ALPRAZolam (XANAX) 0.5 MG tablet Take 1 tablet (0.5 mg total) by mouth 2 (two) times daily as needed for anxiety. 20 tablet 0  . amoxicillin (AMOXIL) 500 MG capsule Take 2,000 mg by mouth See admin instructions. Take four 4 capsules (2000 mg) by mouth one (1) hour prior to dental procedures.    . colchicine 0.6 MG tablet Take 1 tablet (0.6 mg total) by mouth 2 (two) times daily. 28 tablet 0  . cycloSPORINE (RESTASIS) 0.05 % ophthalmic emulsion Place 1 drop into both eyes 2 (two) times daily.     Marland Kitchen dofetilide (TIKOSYN) 125 MCG capsule Take 1 capsule by mouth  twice daily along with a 250 mcg  capsule (Patient taking differently: Take 125 mcg by mouth 2 (two) times daily. ) 60 capsule 11  . dorzolamide-timolol (COSOPT) 22.3-6.8 MG/ML ophthalmic solution Place 1 drop into both eyes 2 (two) times daily as needed (for dryness).     . furosemide (LASIX) 40 MG tablet Take 1 tablet (40 mg total) by mouth every evening.    . latanoprost (XALATAN) 0.005 % ophthalmic solution Place 1 drop into both eyes at bedtime.     . metoprolol succinate (TOPROL-XL) 25 MG 24 hr tablet TAKE 2 TABLETS BY MOUTH ONCE DAILY. (Patient taking differently: Take 50 mg by mouth daily. ) 60 tablet 8  . Multiple Vitamins-Minerals (ICAPS) CAPS Take 1 capsule by mouth 2 (two) times daily.     . naproxen sodium (ALEVE) 220 MG tablet Take 220-440 mg by mouth 2 (two) times daily as needed (for headaches).    . Omega-3 Fatty Acids (FISH OIL) 1000 MG CAPS Take 1,000 mg by mouth 2 (two) times daily.     Marland Kitchen oxycodone (OXY-IR) 5 MG capsule Take 5 mg by mouth every 4 (four) hours as needed.    . pantoprazole (PROTONIX) 40 MG tablet Take 1 tablet (40 mg total) by mouth daily. 45 tablet 0  . PARoxetine (PAXIL) 20 MG tablet Take 1 tablet (20 mg total) by mouth daily. 90 tablet 3  . polyethylene glycol (MIRALAX / GLYCOLAX) packet Take 17 g by mouth daily as needed for moderate constipation. 14 each 0  . potassium chloride SA (K-DUR,KLOR-CON) 20 MEQ tablet Take 1 tablet (20 mEq total) by mouth 2 (two) times daily. 60 tablet 11  . rosuvastatin (CRESTOR) 40 MG tablet Take 40 mg by mouth every evening.     Marland Kitchen SYNTHROID 75 MCG tablet Take 75 mcg by mouth daily before breakfast.    . traMADol (ULTRAM) 50 MG tablet Take 50 mg by mouth every 12 (twelve) hours as needed for moderate pain.     Marland Kitchen warfarin (COUMADIN) 5 MG tablet TAKE AS DIRECTED BY COUMADIN CLINIC. (Patient taking differently: Take 2.5-5 mg by mouth See admin instructions. For the week of 08/22/2018: Take 2.5 mg by mouth in the morning on  Sun/Tues/Thurs, 5 mg on Mon/Wed and RE-CHECK INR on Friday, 08/27/2018) 30 tablet 3  . ZIOPTAN 0.0015 % SOLN Place 1 drop into both eyes at bedtime.     Marland Kitchen HYDROcodone-acetaminophen (NORCO/VICODIN) 5-325 MG tablet Take 1 tablet by mouth every 6 (six) hours as needed for moderate pain or severe pain. (Patient not taking: Reported on 09/07/2018) 20 tablet 0  . zolmitriptan (ZOMIG) 5 MG tablet Take 5 mg by mouth daily as needed for migraine.      No current facility-administered medications for this visit.    Facility-Administered Medications Ordered in Other Visits  Medication Dose Route Frequency Provider Last Rate Last Dose  . Zoledronic Acid (ZOMETA) 4 mg IVPB  4 mg Intravenous Once Higgs, Vetta, MD        REVIEW OF SYSTEMS:   Constitutional: Denies fevers, chills or abnormal night sweats, (+) low appetite, (+) 20 lbs weight loss , (+) memory loss   Eyes: Denies blurriness of vision, double vision or watery eyes Ears, nose, mouth, throat, and face: Denies mucositis or sore throat Respiratory: Denies cough, dyspnea or wheezes Cardiovascular: Denies palpitation, chest discomfort or lower extremity swelling Gastrointestinal:  Denies nausea, heartburn or change in bowel habits Skin: Denies abnormal skin rashes Lymphatics: Denies new lymphadenopathy or easy bruising Neurological:Denies numbness, tingling or new weaknesses Behavioral/Psych:  Mood is stable, no new changes  Musculoskeletal: (+) severe constant lower back and hip pain  All other systems were reviewed with the patient and are negative.  PHYSICAL EXAMINATION: ECOG PERFORMANCE STATUS: 3 - Symptomatic, >50% confined to bed  Vitals:   09/07/18 1047  BP: 118/68  Pulse: 71  Resp: 19  Temp: (!) 97.4 F (36.3 C)  SpO2: 99%   Filed Weights   09/07/18 1047  Weight: 125 lb 14.4 oz (57.1 kg)    GENERAL:alert, no distress and comfortable, (+) on a wheelchair  SKIN: skin color, texture, turgor are normal, no rashes or significant  lesions EYES: normal, conjunctiva are pink and non-injected, sclera clear OROPHARYNX:no exudate, no erythema and lips, buccal mucosa, and tongue normal  NECK: supple, thyroid normal size, non-tender, without nodularity, (+) a few 1 cm supraclavicular lymph nodes  LYMPH:  no palpable lymphadenopathy in the cervical, axillary or inguinal LUNGS: clear to auscultation and percussion with normal breathing effort HEART: regular rate & rhythm and no murmurs and no lower extremity edema ABDOMEN:abdomen soft, and normal bowel sounds, (+) RUQ tenderness  Musculoskeletal:no cyanosis of digits and no clubbing  PSYCH: alert & oriented x 3 with fluent speech NEURO: no focal motor/sensory deficits  LABORATORY DATA:  I have reviewed the data as listed CBC Latest Ref Rng & Units 08/26/2018 08/25/2018 08/23/2018  WBC 4.0 - 10.5 K/uL 8.9 9.4 9.1  Hemoglobin 12.0 - 15.0 g/dL 12.2 13.9 15.1(H)  Hematocrit 36.0 - 46.0 % 38.2 42.6 45.6  Platelets 150 - 400 K/uL 150 175 193   CMP Latest Ref Rng & Units 08/27/2018 08/26/2018 08/25/2018  Glucose 70 - 99 mg/dL 125(H) 111(H) 112(H)  BUN 8 - 23 mg/dL 15 18 17   Creatinine 0.44 - 1.00 mg/dL 1.09(H) 0.95 0.98  Sodium 135 - 145 mmol/L 136 139 141  Potassium 3.5 - 5.1 mmol/L 3.3(L) 3.7 3.6  Chloride 98 - 111 mmol/L 102 107 104  CO2 22 - 32 mmol/L 24 21(L) 24  Calcium 8.9 - 10.3 mg/dL 10.2 10.7(H) 11.0(H)  Total Protein 6.5 - 8.1 g/dL - 5.6(L) 6.1(L)  Total Bilirubin 0.3 - 1.2 mg/dL - 0.8 1.0  Alkaline Phos 38 - 126 U/L - 325(H) 334(H)  AST 15 - 41 U/L - 65(H) 66(H)  ALT 0 - 44 U/L - 52(H) 62(H)     RADIOGRAPHIC STUDIES: I have personally reviewed the radiological images as listed and agreed with the findings in the report. Dg Chest 2 View  Result Date: 08/23/2018 CLINICAL DATA:  Chest and back pain EXAM: CHEST - 2 VIEW COMPARISON:  08/31/2013 FINDINGS: Normal heart size. Prior median sternotomy. Dual-chamber pacer leads in stable position. Trace pleural fluid. No  pulmonary edema or air bronchogram. No pneumothorax. Posterior left fourth rib appears to be newly eroded. This area was not covered on a January 2020 CT. IMPRESSION: The posterior left fourth rib is diffusely eroded, concerning for an aggressive/malignant process. Recommend chest CT with contrast if possible. Electronically Signed   By: Monte Fantasia M.D.   On: 08/23/2018 07:19   Ct Head W & Wo Contrast  Result Date: 08/25/2018 CLINICAL DATA:  Initial evaluation for encephalopathy, metastatic workup. EXAM: CT HEAD WITHOUT AND WITH CONTRAST TECHNIQUE: Contiguous axial images were obtained from the base of the skull through the vertex without and with intravenous contrast CONTRAST:  44mL OMNIPAQUE IOHEXOL 300 MG/ML  SOLN COMPARISON:  Prior MRI from 10/28/2010. FINDINGS: Brain: Generalized age-related cerebral atrophy with mild chronic small vessel ischemic disease.  No acute intracranial hemorrhage. No acute large vessel territory infarct. No mass lesion, midline shift, or mass effect. No hydrocephalus. No extra-axial fluid collection. No abnormal enhancement seen with contrast administration. No findings to suggest metastatic disease. Vascular: Normal intravascular enhancement seen throughout the intracranial circulation. Scattered vascular calcifications noted within the carotid siphons. Skull: Scalp soft tissues demonstrate no acute finding. Calvarium intact. No focal osseous lesions. Sinuses/Orbits: Globes and orbital soft tissues within normal limits. Mild mucosal thickening within the left ethmoidal air cells. Paranasal sinuses and mastoid air cells are otherwise clear. Other: None. IMPRESSION: 1. No acute intracranial abnormality. No abnormal enhancement or evidence for metastatic disease. 2. Age-related cerebral atrophy with mild chronic small vessel ischemic disease. Electronically Signed   By: Jeannine Boga M.D.   On: 08/25/2018 16:48   Ct Chest W Contrast  Result Date: 08/23/2018 CLINICAL  DATA:  Recent atrial ablation, persistent back and chest discomfort, abnormal chest radiograph with left fourth rib lesion EXAM: CT CHEST, ABDOMEN, AND PELVIS WITH CONTRAST TECHNIQUE: Multidetector CT imaging of the chest, abdomen and pelvis was performed following the standard protocol during bolus administration of intravenous contrast. CONTRAST:  171mL OMNIPAQUE IOHEXOL 300 MG/ML  SOLN COMPARISON:  Same day chest radiograph, CT abdomen pelvis, 09/01/2013 FINDINGS: CT CHEST FINDINGS Cardiovascular: Cardiomegaly with left chest multi lead pacer defibrillator and evidence of atrial ablation. No pericardial effusion. Mediastinum/Nodes: No enlarged mediastinal, hilar, or axillary lymph nodes. Thyroid gland, trachea, and esophagus demonstrate no significant findings. Lungs/Pleura: Lungs are clear.  Trace pleural effusion. Musculoskeletal: There are destructive, expansile lesions of the left fourth and fifth ribs, in keeping with findings of prior radiograph. There are numerous lytic lesions of the thoracic spine and a superior endplate fracture associated with a lytic lesion of the T11 vertebral body (series 7, image 85). CT ABDOMEN PELVIS FINDINGS Hepatobiliary: There is a large, hypodense mass lesion of the central inferior right lobe of the liver, centered in segment V, with numerous additional small hypodense lesions. Tiny gallstones in the gallbladder. Pancreas: Unremarkable. No pancreatic ductal dilatation or surrounding inflammatory changes. Spleen: Normal in size without focal abnormality. Adrenals/Urinary Tract: Adrenal glands are unremarkable. Kidneys are normal, without renal calculi, focal lesion, or hydronephrosis. Bladder is unremarkable. Stomach/Bowel: Stomach is within normal limits. Appendix appears normal. No evidence of bowel wall thickening, distention, or inflammatory changes. Vascular/Lymphatic: No significant vascular findings are present. No enlarged abdominal or pelvic lymph nodes.  Reproductive: No mass or other abnormality. Status post hysterectomy. Other: No abdominal wall hernia or abnormality. No abdominopelvic ascites. Musculoskeletal: Numerous lytic lesions of the lumbar spine pelvis, and proximal femurs. IMPRESSION: 1. There is a large, hypodense mass lesion of the central inferior right lobe of the liver, centered in segment V, with numerous additional small hypodense lesions. 2. Numerous lytic lesions of the included skeleton, particularly including left rib lesions corresponding to radiographic findings, a superior endplate fracture associated with a lesion of the T11 vertebral body and lesions involving the pelvis and bilateral proximal femurs. 3. 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 4.  Other chronic and incidental findings as detailed above. Electronically Signed   By: Eddie Candle M.D.   On: 08/23/2018 08:47   Ct Abdomen Pelvis W Contrast  Result Date: 08/23/2018 CLINICAL DATA:  Recent atrial ablation, persistent back and chest discomfort, abnormal chest radiograph with left fourth rib lesion EXAM: CT CHEST, ABDOMEN, AND PELVIS WITH CONTRAST TECHNIQUE: Multidetector CT imaging of the chest,  abdomen and pelvis was performed following the standard protocol during bolus administration of intravenous contrast. CONTRAST:  162mL OMNIPAQUE IOHEXOL 300 MG/ML  SOLN COMPARISON:  Same day chest radiograph, CT abdomen pelvis, 09/01/2013 FINDINGS: CT CHEST FINDINGS Cardiovascular: Cardiomegaly with left chest multi lead pacer defibrillator and evidence of atrial ablation. No pericardial effusion. Mediastinum/Nodes: No enlarged mediastinal, hilar, or axillary lymph nodes. Thyroid gland, trachea, and esophagus demonstrate no significant findings. Lungs/Pleura: Lungs are clear.  Trace pleural effusion. Musculoskeletal: There are destructive, expansile lesions of the left fourth and  fifth ribs, in keeping with findings of prior radiograph. There are numerous lytic lesions of the thoracic spine and a superior endplate fracture associated with a lytic lesion of the T11 vertebral body (series 7, image 85). CT ABDOMEN PELVIS FINDINGS Hepatobiliary: There is a large, hypodense mass lesion of the central inferior right lobe of the liver, centered in segment V, with numerous additional small hypodense lesions. Tiny gallstones in the gallbladder. Pancreas: Unremarkable. No pancreatic ductal dilatation or surrounding inflammatory changes. Spleen: Normal in size without focal abnormality. Adrenals/Urinary Tract: Adrenal glands are unremarkable. Kidneys are normal, without renal calculi, focal lesion, or hydronephrosis. Bladder is unremarkable. Stomach/Bowel: Stomach is within normal limits. Appendix appears normal. No evidence of bowel wall thickening, distention, or inflammatory changes. Vascular/Lymphatic: No significant vascular findings are present. No enlarged abdominal or pelvic lymph nodes. Reproductive: No mass or other abnormality. Status post hysterectomy. Other: No abdominal wall hernia or abnormality. No abdominopelvic ascites. Musculoskeletal: Numerous lytic lesions of the lumbar spine pelvis, and proximal femurs. IMPRESSION: 1. There is a large, hypodense mass lesion of the central inferior right lobe of the liver, centered in segment V, with numerous additional small hypodense lesions. 2. Numerous lytic lesions of the included skeleton, particularly including left rib lesions corresponding to radiographic findings, a superior endplate fracture associated with a lesion of the T11 vertebral body and lesions involving the pelvis and bilateral proximal femurs. 3. 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 4.  Other chronic and incidental findings as detailed above. Electronically Signed   By:  Eddie Candle M.D.   On: 08/23/2018 08:47   US Biopsy (liver)  Result Date: 08/25/2018 INDICATION: 78 year old female with recently diagnosed multiple liver lesions and bone lesions concerning for metastatic disease of uncertain primary. Possible cholangiocarcinoma or hepatocellular carcinoma. She presents for ultrasound-guided core biopsy of liver lesion to confirm tissue diagnosis EXAM: Ultrasound-guided core biopsy of liver lesion MEDICATIONS: None. ANESTHESIA/SEDATION: Moderate (conscious) sedation was employed during this procedure. A total of Versed 1.5 mg and Fentanyl 50 mcg was administered intravenously. Moderate Sedation Time: 16 minutes. The patient's level of consciousness and vital signs were monitored continuously by radiology nursing throughout the procedure under my direct supervision. FLUOROSCOPY TIME:  None COMPLICATIONS: None immediate. PROCEDURE: Informed written consent was obtained from the patient after a thorough discussion of the procedural risks, benefits and alternatives. All questions were addressed. Maximal Sterile Barrier Technique was utilized including caps, mask, sterile gowns, sterile gloves, sterile drape, hand hygiene and skin antiseptic. A timeout was performed prior to the initiation of the procedure. The liver was interrogated with ultrasound. A suitable 1.3 x 1.5 cm hypoechoic lesion was identified in the central right hepatic lobe. A suitable skin entry site was selected and marked. The overlying skin was sterilely prepped and draped in standard fashion using chlorhexidine skin prep. Local anesthesia was attained by infiltration with 1% lidocaine. A small dermatotomy was made.  Under real-time sonographic guidance, a 17 gauge introducer needle was advanced to the margin of the mass. Multiple 18 gauge core biopsies were then obtained using the bio Pince automated biopsy device. Biopsy specimens were placed in formalin and delivered to pathology for further analysis. As the  introducer needle was removed, the biopsy tract was embolized with a Gel-Foam slurry. Post biopsy ultrasound images demonstrate no immediate complication. IMPRESSION: Technically successful ultrasound-guided core biopsy of right hepatic lesion. Electronically Signed   By: Jacqulynn Cadet M.D.   On: 08/25/2018 16:52    ASSESSMENT & PLAN:  KEYERA HATTABAUGH is a 78 y.o. female with history of  1. 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  -I reviewed her CT scan and liver biopsy results with patient in person.  -Unfortunately her CT scan showed a large primary tumor in the liver with satellite lesions, possible invading the right kidney, and diffuse bone metastasis in spine, ribs and hips.  Her liver mass biopsy showed hepatocellular carcinoma.  We discussed that [facility] is uncommon in patient without history of liver disease or liver cirrhosis, I will review her case in our GI tumor board next week. -Given her severe low back pain, which is probably related to the T11 vertebral body bone mets, I will refer her to rad/onc to consider palliative radiation. -She has a pacemaker, not able to do spine MRI. -I discussed the systemic therapy option for metastatic HCC, including PPIs, such as sorafenib or lenvatinib, immunotherapy, and recent clinical trial data with Tecentriq and Avastin, which showed excellent response rate in [place].  Due to her poor performance status, she may not be able to tolerate petechiae well, I recommend Tecentriq and Avastin every 3 weeks, if I can get her insurance approval. -We discussed the goal of therapy is palliative, to prolong her life and improve her symptoms.  Unfortunately her cancer is not curable. -Given her limited social support (she is going through divorce, no children, only has 2 friends and one brother available for support), I also discussed palliative  care alone, such as hospice. She is overwhelmed, having trouble to make decision at this point.  I called her friend Doneta Public and discussed the above. I encourage she bring her friends and brother to her next appointment to help her make a decision. -I will also inform social worker to support her such as living will. We discussed DNR -I will see her back in 2 weeks to finalize her treatment plan   2. 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 Diffuse bone mets  -will start her on dexa 4mg  bid for her diffuse spinal bone metastasis with severe back pain. -I plan to start her on Zometa or Xgeva soon, she received one dose zometa on 2/12   4.  Encephalopathy -She appears to be slow in response, and has memory issue  -will repeat her BMP today, will discuss with her friends and brother to get a better idea of her baseline on next visit   5. Goal of care discussion  -We again discussed the incurable nature of her cancer, and the overall poor prognosis, especially if she does not have good response to therapy or progress on treatment  -The patient understands the goal of care is palliative. -I recommend DNR/DNI, she will think about it   Plan  -  I will refer her to see radiation oncologist, Dr. Lisbeth Renshaw  - I will contact social worker to get her a power attorney and living will  - She will have labs today  - I prescribed dexamethasone 4 mg BID for her spinal bone mets  - F/u in a week to discuss treatment   No orders of the defined types were placed in this encounter.   All questions were answered. The patient knows to call the clinic with any problems, questions or concerns. I spent 55 minutes counseling the patient face to face. The total time spent in the appointment was 60 minutes and more than 50% was on counseling.  I, Manson Allan am acting as scribe for Dr. Truitt Merle.  I have reviewed the above documentation for  accuracy and completeness, and I agree with the above.      Truitt Merle, MD 09/07/2018

## 2018-09-07 ENCOUNTER — Inpatient Hospital Stay: Payer: Medicare Other | Attending: Hematology | Admitting: Hematology

## 2018-09-07 ENCOUNTER — Inpatient Hospital Stay: Payer: Medicare Other

## 2018-09-07 ENCOUNTER — Encounter: Payer: Self-pay | Admitting: Hematology

## 2018-09-07 VITALS — BP 118/68 | HR 71 | Temp 97.4°F | Resp 19 | Ht 64.0 in | Wt 125.9 lb

## 2018-09-07 DIAGNOSIS — I4819 Other persistent atrial fibrillation: Secondary | ICD-10-CM | POA: Diagnosis not present

## 2018-09-07 DIAGNOSIS — Z950 Presence of cardiac pacemaker: Secondary | ICD-10-CM

## 2018-09-07 DIAGNOSIS — I509 Heart failure, unspecified: Secondary | ICD-10-CM

## 2018-09-07 DIAGNOSIS — C220 Liver cell carcinoma: Secondary | ICD-10-CM | POA: Insufficient documentation

## 2018-09-07 DIAGNOSIS — Z7189 Other specified counseling: Secondary | ICD-10-CM

## 2018-09-07 DIAGNOSIS — G9340 Encephalopathy, unspecified: Secondary | ICD-10-CM | POA: Diagnosis not present

## 2018-09-07 DIAGNOSIS — C7951 Secondary malignant neoplasm of bone: Secondary | ICD-10-CM | POA: Insufficient documentation

## 2018-09-07 DIAGNOSIS — I2510 Atherosclerotic heart disease of native coronary artery without angina pectoris: Secondary | ICD-10-CM | POA: Diagnosis not present

## 2018-09-07 DIAGNOSIS — Z87891 Personal history of nicotine dependence: Secondary | ICD-10-CM | POA: Insufficient documentation

## 2018-09-07 DIAGNOSIS — Z952 Presence of prosthetic heart valve: Secondary | ICD-10-CM | POA: Diagnosis not present

## 2018-09-07 LAB — BASIC METABOLIC PANEL - CANCER CENTER ONLY
Anion gap: 8 (ref 5–15)
BUN: 17 mg/dL (ref 8–23)
CALCIUM: 9.6 mg/dL (ref 8.9–10.3)
CO2: 21 mmol/L — AB (ref 22–32)
Chloride: 107 mmol/L (ref 98–111)
Creatinine: 1.31 mg/dL — ABNORMAL HIGH (ref 0.44–1.00)
GFR, EST AFRICAN AMERICAN: 46 mL/min — AB (ref >60)
GFR, Estimated: 39 mL/min — ABNORMAL LOW (ref >60)
Glucose, Bld: 117 mg/dL — ABNORMAL HIGH (ref 70–99)
Potassium: 4 mmol/L (ref 3.5–5.1)
Sodium: 136 mmol/L (ref 135–145)

## 2018-09-07 MED ORDER — DEXAMETHASONE 4 MG PO TABS: 4.0000 mg | ORAL_TABLET | Freq: Two times a day (BID) | ORAL | 0 refills | Status: AC

## 2018-09-08 ENCOUNTER — Telehealth: Payer: Self-pay

## 2018-09-08 ENCOUNTER — Ambulatory Visit: Payer: Medicare Other | Admitting: Interventional Cardiology

## 2018-09-08 ENCOUNTER — Telehealth: Payer: Self-pay | Admitting: Hematology

## 2018-09-08 ENCOUNTER — Encounter: Payer: Self-pay | Admitting: Hematology

## 2018-09-08 LAB — CANCER ANTIGEN 19-9: CA 19-9: 102 U/mL — ABNORMAL HIGH (ref 0–35)

## 2018-09-08 NOTE — Progress Notes (Signed)
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  She had constant pain in her lower back that diffused to her hips and rated 10/10.  She was fatigued and had some weakness in both legs.  CT Head 08/25/2018: No acute intracranial abnormality.  No abnormal enhancement or evidence for metastatic disease.  Chest xray: posterior left fourth rib was diffusely eroded.  CT CAP 08/23/2018: large hypodense mass lesion of the central inferior right lobe of the liver, centered in segment V, with numerous additional small hypodense lesions.  Numerous lytic lesions of the included skeleton, particularly including left rib lesions corresponding to radiographic findings, a superior endplate fracture associated with a lesion of the T11 vertebral body and lesions involving the pelvis and bilateral femurs.  Pathology: Liver 08/25/2018    Sites of Visceral and Bony Metastatic Disease:  Left 4-5 ribs, superior endplate fracture associated with T11, bilateral femurs, pelvis.  Location(s) of Symptomatic Metastases:   Past/Anticipated chemotherapy by medical oncology, if any:  Dr. Feng 09/07/2018 -I will contact her friend Pat Watson and her brother to discuss her health. -I will refer her to see a radiation oncologist, Dr. Moody. -I will contact social worker to get her a power of attorney and living will. -I prescribed dexamethasone 4 mg BID.  Pain on a scale of 0-10 is:  Middle of back down to sacrum.   If Spine Met(s), symptoms, if any, include:  Bowel/Bladder retention or incontinence (please describe): Having constipation, finally had a bowel movement today.  Numbness or weakness in extremities (please describe):  No  Current Decadron regimen, if applicable: 4 mg BID  Ambulatory status? Walker? Wheelchair?: Ambulates with a cane/walker, uses wheelchair on occasion.  BP (!) 122/91 (BP Location: Left Arm, Patient Position: Sitting)   Pulse 82   Temp 97.7 F (36.5 C) (Oral)   Resp 18   Ht 5' 4" (1.626 m)   Wt 129 lb (58.5 kg)   BMI 22.14 kg/m    Wt Readings from Last 3 Encounters:  09/09/18 129 lb (58.5 kg)  09/07/18 125 lb 14.4 oz (57.1 kg)  08/23/18 122 lb 2.2 oz (55.4 kg)    SAFETY ISSUES:  Prior radiation? No  Pacemaker/ICD? Pacemaker insertion 01/13/2012  Possible current pregnancy? Abdominal hysterectomy  Is the patient on methotrexate? No  Current Complaints / other details:   -History of atrial fibrillation ablation 07/27/2018   

## 2018-09-08 NOTE — Telephone Encounter (Signed)
Spoke with M[place]a nurse at [facility] with patient's appointments, also informed her that Dr. Burr Medico would like the patient to get a liter of NS over 2 hours, she states they are able to take care of this, faxed over order.

## 2018-09-08 NOTE — Progress Notes (Signed)
START OFF PATHWAY REGIMEN - Other Dx   [id number]:Atezolizumab 1,200 mg IV D1 + Bevacizumab 15 mg/kg IV D1 q21 Days:   A cycle is every 21 Days:     Atezolizumab      Bevacizumab-xxxx   **Always confirm dose/schedule in your pharmacy ordering system**  Patient Characteristics: Intent of Therapy: Non-Curative / Palliative Intent, Discussed with Patient 

## 2018-09-08 NOTE — Telephone Encounter (Signed)
Called patient about scheduled appt.  Was not able to leave a VM.  Will try calling the patient again.

## 2018-09-08 NOTE — Telephone Encounter (Signed)
Spoke with [place] facility about her appointments for tomorrow 09/09/2018.  She needs to be here by 12:00, explained what the appointments are.  RN verbalized an understanding and speak to transportation.

## 2018-09-09 ENCOUNTER — Ambulatory Visit
Admission: RE | Admit: 2018-09-09 | Discharge: 2018-09-09 | Disposition: A | Discharge status: Home / Self Care | Payer: Medicare Other | Source: Ambulatory Visit | Attending: Radiation Oncology | Admitting: Radiation Oncology

## 2018-09-09 ENCOUNTER — Encounter: Payer: Self-pay | Admitting: Radiation Oncology

## 2018-09-09 ENCOUNTER — Other Ambulatory Visit: Payer: Self-pay

## 2018-09-09 VITALS — BP 122/91 | HR 82 | Temp 97.7°F | Resp 18 | Ht 64.0 in | Wt 129.0 lb

## 2018-09-09 DIAGNOSIS — R001 Bradycardia, unspecified: Secondary | ICD-10-CM | POA: Diagnosis not present

## 2018-09-09 DIAGNOSIS — M545 Low back pain: Secondary | ICD-10-CM | POA: Insufficient documentation

## 2018-09-09 DIAGNOSIS — Z79899 Other long term (current) drug therapy: Secondary | ICD-10-CM | POA: Diagnosis not present

## 2018-09-09 DIAGNOSIS — F419 Anxiety disorder, unspecified: Secondary | ICD-10-CM | POA: Insufficient documentation

## 2018-09-09 DIAGNOSIS — Z510 Encounter for antineoplastic radiation therapy: Secondary | ICD-10-CM | POA: Insufficient documentation

## 2018-09-09 DIAGNOSIS — Z87891 Personal history of nicotine dependence: Secondary | ICD-10-CM | POA: Insufficient documentation

## 2018-09-09 DIAGNOSIS — I5032 Chronic diastolic (congestive) heart failure: Secondary | ICD-10-CM | POA: Diagnosis not present

## 2018-09-09 DIAGNOSIS — I4891 Unspecified atrial fibrillation: Secondary | ICD-10-CM | POA: Diagnosis not present

## 2018-09-09 DIAGNOSIS — I2510 Atherosclerotic heart disease of native coronary artery without angina pectoris: Secondary | ICD-10-CM | POA: Insufficient documentation

## 2018-09-09 DIAGNOSIS — C7951 Secondary malignant neoplasm of bone: Secondary | ICD-10-CM | POA: Diagnosis not present

## 2018-09-09 DIAGNOSIS — Z7901 Long term (current) use of anticoagulants: Secondary | ICD-10-CM | POA: Diagnosis not present

## 2018-09-09 DIAGNOSIS — G4700 Insomnia, unspecified: Secondary | ICD-10-CM | POA: Insufficient documentation

## 2018-09-09 DIAGNOSIS — Z87442 Personal history of urinary calculi: Secondary | ICD-10-CM | POA: Diagnosis not present

## 2018-09-09 DIAGNOSIS — C220 Liver cell carcinoma: Secondary | ICD-10-CM

## 2018-09-09 DIAGNOSIS — E039 Hypothyroidism, unspecified: Secondary | ICD-10-CM | POA: Diagnosis not present

## 2018-09-09 DIAGNOSIS — G4733 Obstructive sleep apnea (adult) (pediatric): Secondary | ICD-10-CM | POA: Diagnosis not present

## 2018-09-09 DIAGNOSIS — E785 Hyperlipidemia, unspecified: Secondary | ICD-10-CM | POA: Diagnosis not present

## 2018-09-09 NOTE — Progress Notes (Signed)
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   HISTORY OF PRESENT ILLNESS: Jo Hughes is a 78 y.o. female seen at the request of Dr. 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  She was being worked up for ablation in cardiology due to history of atrial fibrillation, and underwent radiofrequency catheter ablation on 07/27/2018 by Dr. Rayann Heman.  At the conclusion she was in sinus rhythm, and she was discharged home in good condition.  The patient complained of chest pain on 08/12/2018, and was evaluated in the clinic the following day.  She proceeded with echo that did not reveal evidence of pericardial effusion.  She was started on colchicine.  She called on 08/20/2018 complaining of feeling extremely short of breath and was concerned about risks of bleeding.  She was encouraged to proceed to the emergency department and ultimately proceeded there on 08/23/2018 after having increasing abdominal pain as well as low back pain extending up to her scapula left greater than right.  A CT scan of the chest with contrast revealed postoperative changes in the chest consistent with her recent intervention, no abnormalities in the mediastinal hilar or axillary nodes were seen, the lungs were clear with a trace pleural effusion, but concerns for destructive expansile lesions were noted in the left fourth and fifth ribs, numerous lytic lesions in the thoracic spine and superior endplate fracture associated with the lesion at T11.  CT abdomen pelvis revealed a large hypodense mass in the right lobe of the liver at segment 5 with numerous small hypodense lesions, tiny gallstones in the gallbladder, as  well as lytic appearing lesions throughout the lumbar spine and bilateral proximal femurs. She was admitted and underwent CT of the head with and without contrast that revealed no intracranial evidence of disease.  She also underwent a biopsy of her liver lesion on 06/24/2018 and final pathology revealed malignant epithelial neoplasm consistent with hepatocellular carcinoma.  Her AFP was 8.5, CEA 11.8 and she was seen by medical oncology and encouraged to proceed to outpatient follow-up.  On 09/07/2018 CA-19-9 was also drawn and this was 102.  She met with Dr. Renaee Munda on 09/07/2018 and recommendations were to proceed with systemic therapy, she is scheduled to see Dr. Renaee Munda next Friday, to discuss beginning atezolizumab, and bevacizumab.  She comes today to discuss options of palliative radiotherapy for her pain.   PREVIOUS RADIATION THERAPY: No   PAST MEDICAL HISTORY:  Past Medical History:  Diagnosis Date  . Anxiety   . Atrial fibrillation ([place])    persistent  . CAD (coronary artery disease)   . Chronic diastolic CHF (congestive heart failure) ([place])   . 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 Glaucoma   . H/O mitral valve repair 2005   [place].  . H/O tricuspid valve repair   . Hx of acquired endocarditis 1970; 1986   Prior to mitral valve repair   . Hyperlipidemia    cardiac cath clean coronary arties in the past.  . Hypothyroid   . Insomnia 12/11/2015  . Kidney stone 10/2013   "pass it"  .  Liver mass 08/2018  . Long term (current) use of anticoagulants    No bleeding  . Macular degeneration    early , [place]  . OSA (obstructive sleep apnea) 08/14/2015   Mild with AHI 7/hr  . Symptomatic bradycardia    s/p PPM  . Thyroid nodule        PAST SURGICAL HISTORY: Past Surgical History:  Procedure Laterality Date  . ABDOMINAL HYSTERECTOMY  1986  . ATRIAL FIBRILLATION ABLATION N/A 11/08/2013   Procedure: ATRIAL FIBRILLATION ABLATION;   Surgeon: Coralyn Mark, MD;  Location: [place] CATH LAB;  Service: Cardiovascular;  Laterality: N/A;  . ATRIAL FIBRILLATION ABLATION N/A 07/27/2018   Procedure: ATRIAL FIBRILLATION ABLATION;  Surgeon: Thompson Grayer, MD;  Location: [place] CV LAB;  Service: Cardiovascular;  Laterality: N/A;  . BACK SURGERY    . BREAST BIOPSY Left X 2   "benign"  . CARDIAC CATHETERIZATION  X 3  . CARDIOVERSION  ~ 2011; 09/18/2011   ?; Procedure: CARDIOVERSION;  Surgeon: Sinclair Grooms, MD;  Location: [place];  Service: Cardiovascular;  Laterality: N/A;  . CARDIOVERSION N/A 04/29/2013   Procedure: CARDIOVERSION;  Surgeon: Sinclair Grooms, MD;  Location: [place];  Service: Cardiovascular;  Laterality: N/A;  . CATARACT EXTRACTION W/ INTRAOCULAR LENS  IMPLANT, BILATERAL  ~ 2011  . DILATION AND CURETTAGE OF UTERUS    . INSERT / REPLACE / REMOVE PACEMAKER  01/13/2012   MDT Adapta L implanted by Dr Rayann Heman  . KNEE CARTILAGE SURGERY Left 02/2009   scope; Dr. Berenice Primas  . LUMBAR LAMINECTOMY  1997  . MITRAL VALVE REPAIR  05/2004   [facility]  . PERMANENT PACEMAKER INSERTION N/A 01/13/2012   Procedure: PERMANENT PACEMAKER INSERTION;  Surgeon: Thompson Grayer, MD;  Location: [facility] CATH LAB;  Service: Cardiovascular;  Laterality: N/A;  . TEE WITHOUT CARDIOVERSION N/A 11/07/2013   Procedure: TRANSESOPHAGEAL ECHOCARDIOGRAM (TEE);  Surgeon: Pixie Casino, MD;  Location: [facility] ENDOSCOPY;  Service: Cardiovascular;  Laterality: N/A;  . TONSILLECTOMY  1948  . TRICUSPID VALVE SURGERY  05/2004   "repair; [facility]"     FAMILY HISTORY:  Family History  Problem Relation Age of Onset  . Stroke Father   . Heart attack Father 34  . Heart disease Father   . Hyperlipidemia Father   . Hypertension Father   . Macular degeneration Mother   . Transient ischemic attack Mother        multiple  . Varicose Veins Mother   . Hypertension Brother      SOCIAL HISTORY:  reports that she quit smoking about 31 years ago. Her  smoking use included cigarettes. She has a 24.00 pack-year smoking history. She has never used smokeless tobacco. She reports current alcohol use. She reports that she does not use drugs.  The patient is in the process of divorce, she has limited social support.  She lives in [place].   ALLERGIES: Citalopram hydrobromide; Trazodone and nefazodone; and Liver   MEDICATIONS:  Current Outpatient Medications  Medication Sig Dispense Refill  . ALPRAZolam (XANAX) 0.5 MG tablet Take 1 tablet (0.5 mg total) by mouth 2 (two) times daily as needed for anxiety. 20 tablet 0  . amoxicillin (AMOXIL) 500 MG capsule Take 2,000 mg by mouth See admin instructions. Take four 4 capsules (2000 mg) by mouth one (1) hour prior to dental procedures.    . colchicine 0.6 MG tablet Take 1 tablet (0.6 mg total) by mouth 2 (two) times daily. 28 tablet 0  .  cycloSPORINE (RESTASIS) 0.05 % ophthalmic emulsion Place 1 drop into both eyes 2 (two) times daily.     Marland Kitchen dexamethasone (DECADRON) 4 MG tablet Take 1 tablet (4 mg total) by mouth 2 (two) times daily. 30 tablet 0  . dofetilide (TIKOSYN) 125 MCG capsule Take 1 capsule by mouth twice daily along with a 250 mcg capsule (Patient taking differently: Take 125 mcg by mouth 2 (two) times daily. ) 60 capsule 11  . dorzolamide-timolol (COSOPT) 22.3-6.8 MG/ML ophthalmic solution Place 1 drop into both eyes 2 (two) times daily as needed (for dryness).     . furosemide (LASIX) 40 MG tablet Take 1 tablet (40 mg total) by mouth every evening.    Marland Kitchen HYDROcodone-acetaminophen (NORCO/VICODIN) 5-325 MG tablet Take 1 tablet by mouth every 6 (six) hours as needed for moderate pain or severe pain. (Patient not taking: Reported on 09/07/2018) 20 tablet 0  . latanoprost (XALATAN) 0.005 % ophthalmic solution Place 1 drop into both eyes at bedtime.     . metoprolol succinate (TOPROL-XL) 25 MG 24 hr tablet TAKE 2 TABLETS BY MOUTH ONCE DAILY. (Patient taking differently: Take 50 mg by mouth daily. ) 60  tablet 8  . Multiple Vitamins-Minerals (ICAPS) CAPS Take 1 capsule by mouth 2 (two) times daily.     . naproxen sodium (ALEVE) 220 MG tablet Take 220-440 mg by mouth 2 (two) times daily as needed (for headaches).    . Omega-3 Fatty Acids (FISH OIL) 1000 MG CAPS Take 1,000 mg by mouth 2 (two) times daily.     Marland Kitchen oxycodone (OXY-IR) 5 MG capsule Take 5 mg by mouth every 4 (four) hours as needed.    . pantoprazole (PROTONIX) 40 MG tablet Take 1 tablet (40 mg total) by mouth daily. 45 tablet 0  . PARoxetine (PAXIL) 20 MG tablet Take 1 tablet (20 mg total) by mouth daily. 90 tablet 3  . polyethylene glycol (MIRALAX / GLYCOLAX) packet Take 17 g by mouth daily as needed for moderate constipation. 14 each 0  . potassium chloride SA (K-DUR,KLOR-CON) 20 MEQ tablet Take 1 tablet (20 mEq total) by mouth 2 (two) times daily. 60 tablet 11  . rosuvastatin (CRESTOR) 40 MG tablet Take 40 mg by mouth every evening.     Marland Kitchen SYNTHROID 75 MCG tablet Take 75 mcg by mouth daily before breakfast.    . traMADol (ULTRAM) 50 MG tablet Take 50 mg by mouth every 12 (twelve) hours as needed for moderate pain.     Marland Kitchen warfarin (COUMADIN) 5 MG tablet TAKE AS DIRECTED BY COUMADIN CLINIC. (Patient taking differently: Take 2.5-5 mg by mouth See admin instructions. For the week of 08/22/2018: Take 2.5 mg by mouth in the morning on Sun/Tues/Thurs, 5 mg on Mon/Wed and RE-CHECK INR on Friday, 08/27/2018) 30 tablet 3  . ZIOPTAN 0.0015 % SOLN Place 1 drop into both eyes at bedtime.     Marland Kitchen zolmitriptan (ZOMIG) 5 MG tablet Take 5 mg by mouth daily as needed for migraine.      No current facility-administered medications for this encounter.    Facility-Administered Medications Ordered in Other Encounters  Medication Dose Route Frequency Provider Last Rate Last Dose  . Zoledronic Acid (ZOMETA) 4 mg IVPB  4 mg Intravenous Once Higgs, Mathis Dad, MD         REVIEW OF SYSTEMS: On review of systems, the patient reports that she is doing fair. She  reports her pain is in her mid to low back and radiates down her  back to her pelvis and into her hips, left greater than right. She does have mild chest wall pain on the left as well. She states Oxycodone helps her minimally. She denies any chest pain, shortness of breath, but is visibly short of breath with talking. She denies any cough, fevers, chills, night sweats, unintended weight changes. She reports urgency with bowel and bladder activity but denies a loss of control or sensation. She denies abdominal pain, nausea or vomiting. She denies any other new musculoskeletal or joint aches or pains. A complete review of systems is obtained and is otherwise negative.     PHYSICAL EXAM:  Wt Readings from Last 3 Encounters:  09/07/18 125 lb 14.4 oz (57.1 kg)  08/23/18 122 lb 2.2 oz (55.4 kg)  08/16/18 124 lb 9.6 oz (56.5 kg)   Temp Readings from Last 3 Encounters:  09/07/18 (!) 97.4 F (36.3 C) (Oral)  08/27/18 97.9 F (36.6 C) (Oral)  07/28/18 98.2 F (36.8 C) (Oral)   BP Readings from Last 3 Encounters:  09/07/18 118/68  08/27/18 (!) 147/65  08/16/18 110/72   Pulse Readings from Last 3 Encounters:  09/07/18 71  08/27/18 64  08/16/18 87    /10  In general this is a chronically ill appearing caucasian female in no acute distress. She is alert and oriented x4 and appropriate throughout the examination. HEENT reveals that the patient is normocephalic, atraumatic. EOMs are intact. Skin is intact without any evidence of gross lesions. Cardiopulmonary assessment is negative for acute distress and she exhibits normal effort. Her ICD is located in the left upper chest.  ECOG = 3  0 - Asymptomatic (Fully active, able to carry on all predisease activities without restriction)  1 - Symptomatic but completely ambulatory (Restricted in physically strenuous activity but ambulatory and able to carry out work of a light or sedentary nature. For example, light housework, office work)  2 -  Symptomatic, <50% in bed during the day (Ambulatory and capable of all self care but unable to carry out any work activities. Up and about more than 50% of waking hours)  3 - Symptomatic, >50% in bed, but not bedbound (Capable of only limited self-care, confined to bed or chair 50% or more of waking hours)  4 - Bedbound (Completely disabled. Cannot carry on any self-care. Totally confined to bed or chair)  5 - Death   Eustace Pen MM, Creech RH, Tormey DC, et al. [phone]). "Toxicity and response criteria of the [employer]". [place] Oncol. 5 (6): 649-55    LABORATORY DATA:  Lab Results  Component Value Date   WBC 8.9 08/26/2018   HGB 12.2 08/26/2018   HCT 38.2 08/26/2018   MCV 82.0 08/26/2018   PLT 150 08/26/2018   Lab Results  Component Value Date   NA 136 09/07/2018   K 4.0 09/07/2018   CL 107 09/07/2018   CO2 21 (L) 09/07/2018   Lab Results  Component Value Date   ALT 52 (H) 08/26/2018   AST 65 (H) 08/26/2018   ALKPHOS 325 (H) 08/26/2018   BILITOT 0.8 08/26/2018      RADIOGRAPHY: Dg Chest 2 View  Result Date: 08/23/2018 CLINICAL DATA:  Chest and back pain EXAM: CHEST - 2 VIEW COMPARISON:  08/31/2013 FINDINGS: Normal heart size. Prior median sternotomy. Dual-chamber pacer leads in stable position. Trace pleural fluid. No pulmonary edema or air bronchogram. No pneumothorax. Posterior left fourth rib appears to be newly eroded. This area was not covered  on a January 2020 CT. IMPRESSION: The posterior left fourth rib is diffusely eroded, concerning for an aggressive/malignant process. Recommend chest CT with contrast if possible. Electronically Signed   By: Monte Fantasia M.D.   On: 08/23/2018 07:19   Ct Head W & Wo Contrast  Result Date: 08/25/2018 CLINICAL DATA:  Initial evaluation for encephalopathy, metastatic workup. EXAM: CT HEAD WITHOUT AND WITH CONTRAST TECHNIQUE: Contiguous axial images were obtained from the base of the skull through the vertex  without and with intravenous contrast CONTRAST:  18m OMNIPAQUE IOHEXOL 300 MG/ML  SOLN COMPARISON:  Prior MRI from 10/28/2010. FINDINGS: Brain: Generalized age-related cerebral atrophy with mild chronic small vessel ischemic disease. No acute intracranial hemorrhage. No acute large vessel territory infarct. No mass lesion, midline shift, or mass effect. No hydrocephalus. No extra-axial fluid collection. No abnormal enhancement seen with contrast administration. No findings to suggest metastatic disease. Vascular: Normal intravascular enhancement seen throughout the intracranial circulation. Scattered vascular calcifications noted within the carotid siphons. Skull: Scalp soft tissues demonstrate no acute finding. Calvarium intact. No focal osseous lesions. Sinuses/Orbits: Globes and orbital soft tissues within normal limits. Mild mucosal thickening within the left ethmoidal air cells. Paranasal sinuses and mastoid air cells are otherwise clear. Other: None. IMPRESSION: 1. No acute intracranial abnormality. No abnormal enhancement or evidence for metastatic disease. 2. Age-related cerebral atrophy with mild chronic small vessel ischemic disease. Electronically Signed   By: BJeannine BogaM.D.   On: 08/25/2018 16:48   Ct Chest W Contrast  Result Date: 08/23/2018 CLINICAL DATA:  Recent atrial ablation, persistent back and chest discomfort, abnormal chest radiograph with left fourth rib lesion EXAM: CT CHEST, ABDOMEN, AND PELVIS WITH CONTRAST TECHNIQUE: Multidetector CT imaging of the chest, abdomen and pelvis was performed following the standard protocol during bolus administration of intravenous contrast. CONTRAST:  1067mOMNIPAQUE IOHEXOL 300 MG/ML  SOLN COMPARISON:  Same day chest radiograph, CT abdomen pelvis, 09/01/2013 FINDINGS: CT CHEST FINDINGS Cardiovascular: Cardiomegaly with left chest multi lead pacer defibrillator and evidence of atrial ablation. No pericardial effusion. Mediastinum/Nodes: No  enlarged mediastinal, hilar, or axillary lymph nodes. Thyroid gland, trachea, and esophagus demonstrate no significant findings. Lungs/Pleura: Lungs are clear.  Trace pleural effusion. Musculoskeletal: There are destructive, expansile lesions of the left fourth and fifth ribs, in keeping with findings of prior radiograph. There are numerous lytic lesions of the thoracic spine and a superior endplate fracture associated with a lytic lesion of the T11 vertebral body (series 7, image 85). CT ABDOMEN PELVIS FINDINGS Hepatobiliary: There is a large, hypodense mass lesion of the central inferior right lobe of the liver, centered in segment V, with numerous additional small hypodense lesions. Tiny gallstones in the gallbladder. Pancreas: Unremarkable. No pancreatic ductal dilatation or surrounding inflammatory changes. Spleen: Normal in size without focal abnormality. Adrenals/Urinary Tract: Adrenal glands are unremarkable. Kidneys are normal, without renal calculi, focal lesion, or hydronephrosis. Bladder is unremarkable. Stomach/Bowel: Stomach is within normal limits. Appendix appears normal. No evidence of bowel wall thickening, distention, or inflammatory changes. Vascular/Lymphatic: No significant vascular findings are present. No enlarged abdominal or pelvic lymph nodes. Reproductive: No mass or other abnormality. Status post hysterectomy. Other: No abdominal wall hernia or abnormality. No abdominopelvic ascites. Musculoskeletal: Numerous lytic lesions of the lumbar spine pelvis, and proximal femurs. IMPRESSION: 1. There is a large, hypodense mass lesion of the central inferior right lobe of the liver, centered in segment V, with numerous additional small hypodense lesions. 2. Numerous lytic lesions of the included skeleton, particularly  including left rib lesions corresponding to radiographic findings, a superior endplate fracture associated with a lesion of the T11 vertebral body and lesions involving the pelvis  and bilateral proximal femurs. 3. 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 4.  Other chronic and incidental findings as detailed above. Electronically Signed   By: Eddie Candle M.D.   On: 08/23/2018 08:47   Ct Abdomen Pelvis W Contrast  Result Date: 08/23/2018 CLINICAL DATA:  Recent atrial ablation, persistent back and chest discomfort, abnormal chest radiograph with left fourth rib lesion EXAM: CT CHEST, ABDOMEN, AND PELVIS WITH CONTRAST TECHNIQUE: Multidetector CT imaging of the chest, abdomen and pelvis was performed following the standard protocol during bolus administration of intravenous contrast. CONTRAST:  145m OMNIPAQUE IOHEXOL 300 MG/ML  SOLN COMPARISON:  Same day chest radiograph, CT abdomen pelvis, 09/01/2013 FINDINGS: CT CHEST FINDINGS Cardiovascular: Cardiomegaly with left chest multi lead pacer defibrillator and evidence of atrial ablation. No pericardial effusion. Mediastinum/Nodes: No enlarged mediastinal, hilar, or axillary lymph nodes. Thyroid gland, trachea, and esophagus demonstrate no significant findings. Lungs/Pleura: Lungs are clear.  Trace pleural effusion. Musculoskeletal: There are destructive, expansile lesions of the left fourth and fifth ribs, in keeping with findings of prior radiograph. There are numerous lytic lesions of the thoracic spine and a superior endplate fracture associated with a lytic lesion of the T11 vertebral body (series 7, image 85). CT ABDOMEN PELVIS FINDINGS Hepatobiliary: There is a large, hypodense mass lesion of the central inferior right lobe of the liver, centered in segment V, with numerous additional small hypodense lesions. Tiny gallstones in the gallbladder. Pancreas: Unremarkable. No pancreatic ductal dilatation or surrounding inflammatory changes. Spleen: Normal in size without focal abnormality. Adrenals/Urinary Tract: Adrenal glands are  unremarkable. Kidneys are normal, without renal calculi, focal lesion, or hydronephrosis. Bladder is unremarkable. Stomach/Bowel: Stomach is within normal limits. Appendix appears normal. No evidence of bowel wall thickening, distention, or inflammatory changes. Vascular/Lymphatic: No significant vascular findings are present. No enlarged abdominal or pelvic lymph nodes. Reproductive: No mass or other abnormality. Status post hysterectomy. Other: No abdominal wall hernia or abnormality. No abdominopelvic ascites. Musculoskeletal: Numerous lytic lesions of the lumbar spine pelvis, and proximal femurs. IMPRESSION: 1. There is a large, hypodense mass lesion of the central inferior right lobe of the liver, centered in segment V, with numerous additional small hypodense lesions. 2. Numerous lytic lesions of the included skeleton, particularly including left rib lesions corresponding to radiographic findings, a superior endplate fracture associated with a lesion of the T11 vertebral body and lesions involving the pelvis and bilateral proximal femurs. 3. 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 4.  Other chronic and incidental findings as detailed above. Electronically Signed   By: AEddie CandleM.D.   On: 08/23/2018 08:47   [place] (liver[place]  Result Date: 08/25/2018 INDICATION: 79year old female with recently diagnosed multiple liver lesions and bone lesions concerning for metastatic disease of uncertain primary. Possible cholangiocarcinoma or hepatocellular carcinoma. She presents for ultrasound-guided core biopsy of liver lesion to confirm tissue diagnosis EXAM: Ultrasound-guided core biopsy of liver lesion MEDICATIONS: None. ANESTHESIA/SEDATION: Moderate (conscious) sedation was employed during this procedure. A total of Versed 1.5 mg and Fentanyl 50 mcg was administered intravenously. Moderate Sedation Time: 16  minutes. The patient's level of consciousness and vital signs were monitored continuously by radiology nursing throughout the procedure under my direct supervision. FLUOROSCOPY TIME:  None COMPLICATIONS:  None immediate. PROCEDURE: Informed written consent was obtained from the patient after a thorough discussion of the procedural risks, benefits and alternatives. All questions were addressed. Maximal Sterile Barrier Technique was utilized including caps, mask, sterile gowns, sterile gloves, sterile drape, hand hygiene and skin antiseptic. A timeout was performed prior to the initiation of the procedure. The liver was interrogated with ultrasound. A suitable 1.3 x 1.5 cm hypoechoic lesion was identified in the central right hepatic lobe. A suitable skin entry site was selected and marked. The overlying skin was sterilely prepped and draped in standard fashion using chlorhexidine skin prep. Local anesthesia was attained by infiltration with 1% lidocaine. A small dermatotomy was made. Under real-time sonographic guidance, a 17 gauge introducer needle was advanced to the margin of the mass. Multiple 18 gauge core biopsies were then obtained using the bio Pince automated biopsy device. Biopsy specimens were placed in formalin and delivered to pathology for further analysis. As the introducer needle was removed, the biopsy tract was embolized with a Gel-Foam slurry. Post biopsy ultrasound images demonstrate no immediate complication. IMPRESSION: Technically successful ultrasound-guided core biopsy of right hepatic lesion. Electronically Signed   By: Jacqulynn Cadet M.D.   On: 08/25/2018 16:52       IMPRESSION/PLAN: 1. 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  Dr. Lisbeth Renshaw discusses the findings and imaging studies thus far, as well as the location of the patient's pain.  He discusses the options to consider palliative radiotherapy for her symptoms, and she is in agreement to proceed.  He reviews  the rationale of how radiotherapy works in the settings, and would offer a course of 2 weeks of treatment.  We reviewed the risks, benefits, short and long-term effects of therapy as well as delivery and logistics, and the patient would like to proceed.  She will simulate today for treatment of T11-S1 and ribs. While she has other sites to consider treatment to her T1 and femurs, she is asymptomatic and we would like to limit her bone marrow from radiation exposure any further.  We anticipate beginning her treatment next Monday.  She will follow-up with Dr. Burr Medico to make plans to begin systemic therapy. 2. Pain secondary to #1.  The patient currently is taking oxycodone, this does not appear to be controlling her pain well. We recommend that she begin a long acting agent. I've sent in a prescription for Oxycontin 15 mg #30, one tablet BID no refills to her nursing facility. We will follow this expectantly and anticipate weaning the long acting once she has relief from her radiation.  In a visit lasting 60 minutes, greater than 50% of the time was spent face to face discussing her case, and coordinating the patient's care.  The above documentation reflects my direct findings during this shared patient visit. Please see the separate note by Dr. Lisbeth Renshaw on this date for the remainder of the patient's plan of care.    Carola Rhine, PAC

## 2018-09-10 ENCOUNTER — Telehealth: Payer: Self-pay

## 2018-09-10 ENCOUNTER — Encounter: Payer: Self-pay | Admitting: Hematology

## 2018-09-10 ENCOUNTER — Encounter: Payer: Self-pay | Admitting: General Practice

## 2018-09-10 NOTE — Progress Notes (Signed)
After reviewing pt's treatment plan there aren't any foundations offering copay assistance for her Dx and the type of ins she has.  Since pt will be starting radiation I emailed Ailene Ravel and [facility] requesting they reach out to pt regarding the [employer].

## 2018-09-10 NOTE — Progress Notes (Signed)
[facility] Psychosocial Distress Screening Clinical Social Work  Clinical Social Work was referred by distress screening protocol.  The patient scored a 10 on the Psychosocial Distress Thermometer which indicates severe distress. Clinical Social Worker contacted patient by phone to assess for distress and other psychosocial needs. Noted patient is currently residing at [facility], may not have access to phone.  Left VM on preferred contact number w information on Support Center and how to contact.  Will consult w MD to determine if CSW or Support Center support is needed at this time.    ONCBCN DISTRESS SCREENING 09/09/2018  Screening Type Initial Screening  Distress experienced in past week (1-10) 10  Emotional problem type Depression;Nervousness/Anxiety;Adjusting to illness;Feeling hopeless;Boredom  Information Concerns Type Lack of info about complementary therapy choices;Lack of info about maintaining fitness  Physical Problem type Pain;Sleep/insomnia;Getting around;Bathing/dressing;Mouth sores/swallowing;Loss of appetitie;Talking;Constipation/diarrhea;Changes in urination    Clinical Social Worker follow up needed: No.  TBD based on recommendations of MD and treatment team  If yes, follow up plan:  Beverely Pace, [place], LCSW Clinical Social Worker Phone:  [phone]

## 2018-09-10 NOTE — Telephone Encounter (Signed)
Attempted to call patient to assess for navigation needs and to offer support. Line busy unable to leave VM.

## 2018-09-13 ENCOUNTER — Encounter: Payer: Medicare Other | Admitting: *Deleted

## 2018-09-14 ENCOUNTER — Telehealth: Payer: Self-pay

## 2018-09-14 DIAGNOSIS — Z510 Encounter for antineoplastic radiation therapy: Secondary | ICD-10-CM | POA: Diagnosis not present

## 2018-09-14 DIAGNOSIS — C7951 Secondary malignant neoplasm of bone: Secondary | ICD-10-CM | POA: Diagnosis not present

## 2018-09-14 NOTE — Telephone Encounter (Signed)
Left message for patient to remind of missed remote transmission.  

## 2018-09-15 ENCOUNTER — Ambulatory Visit: Admission: RE | Admit: 2018-09-15 | Payer: Medicare Other | Source: Ambulatory Visit | Admitting: Radiation Oncology

## 2018-09-16 ENCOUNTER — Ambulatory Visit: Payer: Medicare Other | Admitting: Radiation Oncology

## 2018-09-16 ENCOUNTER — Ambulatory Visit: Payer: Medicare Other | Attending: Radiation Oncology

## 2018-09-16 ENCOUNTER — Other Ambulatory Visit: Payer: Self-pay

## 2018-09-16 DIAGNOSIS — C7951 Secondary malignant neoplasm of bone: Principal | ICD-10-CM

## 2018-09-16 DIAGNOSIS — C220 Liver cell carcinoma: Secondary | ICD-10-CM

## 2018-09-17 ENCOUNTER — Telehealth: Payer: Self-pay | Admitting: General Practice

## 2018-09-17 ENCOUNTER — Inpatient Hospital Stay: Payer: Medicare Other | Admitting: General Practice

## 2018-09-17 ENCOUNTER — Telehealth: Payer: Self-pay | Admitting: Hematology

## 2018-09-17 ENCOUNTER — Ambulatory Visit: Payer: Medicare Other

## 2018-09-17 ENCOUNTER — Inpatient Hospital Stay: Payer: Medicare Other

## 2018-09-17 ENCOUNTER — Inpatient Hospital Stay: Payer: Medicare Other | Admitting: Hematology

## 2018-09-17 NOTE — Telephone Encounter (Signed)
[facility] CSW Progress Note  Call from [employer], [place] at [facility].  States that facility has copy of patient's Adv Dir on file, CSW asked if they could provide [place] w a copy.  Facility CSW also states that RN will be talking w patient today, may alter MOST form to make her "comfort measures."  No further clarification provided, asked for return call to get more clarification on facility perception of patient's wishes/needs.  Edwyna Shell, LCSW Clinical Social Worker Phone:  [phone]

## 2018-09-17 NOTE — Telephone Encounter (Signed)
Patients transportation called in and wanted to cancel 3/6 appts for patient - said she will not come in . appts cancelled and RN made aware.

## 2018-09-17 NOTE — Telephone Encounter (Signed)
[facility] CSW Progress Notes  Message received from scheduling that patient has refused transport to [facility] for today's appointment w MD.  MD had requested CSW complete Advance Directives w patient today, called [facility] to determine if they have capability of completing these w patient.  Spoke w Nia RN who is caring for patient at facility today.  Nia states "we just came out of her room, we don't think she is understanding what is going on, what she needs to do."  Facility has contacted her brother to come and discuss options w staff and patient.  RN transferred undersigned CSW to facility SW to discuss options for Advance Directives; however, if patient's decision making capacity is impaired, it may not be possible to complete these documents at this time.  Awaiting return call from [place] to determine how facility will be handling current situation w patient.  MD will be advised.  Edwyna Shell, LCSW Clinical Social Worker Phone:  [phone]

## 2018-09-20 ENCOUNTER — Ambulatory Visit: Payer: Medicare Other | Admitting: Hematology

## 2018-09-20 ENCOUNTER — Ambulatory Visit: Payer: Medicare Other

## 2018-09-20 ENCOUNTER — Encounter: Payer: Self-pay | Admitting: Radiation Oncology

## 2018-09-20 ENCOUNTER — Encounter: Payer: Self-pay | Admitting: Hematology

## 2018-09-21 ENCOUNTER — Ambulatory Visit: Payer: Medicare Other

## 2018-09-22 ENCOUNTER — Ambulatory Visit: Payer: Medicare Other

## 2018-09-23 ENCOUNTER — Ambulatory Visit: Payer: Medicare Other

## 2018-09-24 ENCOUNTER — Ambulatory Visit: Payer: Medicare Other

## 2018-09-27 ENCOUNTER — Ambulatory Visit: Payer: Medicare Other

## 2018-09-28 ENCOUNTER — Ambulatory Visit: Payer: Medicare Other

## 2018-09-29 ENCOUNTER — Ambulatory Visit: Payer: Medicare Other

## 2018-09-30 ENCOUNTER — Ambulatory Visit: Payer: Medicare Other

## 2018-10-01 ENCOUNTER — Ambulatory Visit: Payer: Medicare Other

## 2018-10-04 ENCOUNTER — Ambulatory Visit: Payer: Medicare Other

## 2018-10-05 ENCOUNTER — Ambulatory Visit: Payer: Medicare Other

## 2018-10-06 ENCOUNTER — Ambulatory Visit: Payer: Medicare Other

## 2018-10-07 ENCOUNTER — Ambulatory Visit: Payer: Medicare Other

## 2018-10-08 ENCOUNTER — Ambulatory Visit: Payer: Medicare Other

## 2018-10-13 DEATH — deceased

## 2018-10-26 ENCOUNTER — Encounter: Payer: Self-pay | Admitting: Radiation Oncology

## 2018-10-26 NOTE — Progress Notes (Signed)
  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  Plan: The patient passed away after electing to receive comfort care only. ________________________________  Jodelle Gross, M.D., Ph.D.  This document serves as a record of services personally performed by Kyung Rudd, MD. It was created on his behalf by Rae Lips, a trained medical scribe. The creation of this record is based on the scribe's personal observations and the provider's statements to them. This document has been checked and approved by the attending provider.

## 2018-10-27 NOTE — Progress Notes (Signed)
  Radiation Oncology         (336) [phone] ________________________________  Name: Jo Hughes MRN: [id number]  Date: 09/09/2018  DOB: 22-May-1941  SIMULATION AND TREATMENT PLANNING NOTE  DIAGNOSIS:     ICD-10-CM   1. Metastatic bone tumor ([place]) C79.51      Site:   1.  Left ribs 2.  L-spine  NARRATIVE:  The patient was brought to the [facility].  Identity was confirmed.  All relevant records and images related to the planned course of therapy were reviewed.   Written consent to proceed with treatment was confirmed which was freely given after reviewing the details related to the planned course of therapy had been reviewed with the patient.  Then, the patient was set-up in a stable reproducible  supine position for radiation therapy.  CT images were obtained.  Surface markings were placed.    Medically necessary complex treatment device(s) for immobilization:  Vac-lock bag.   The CT images were loaded into the planning software.  Then the target and avoidance structures were contoured.  Treatment planning then occurred.  The radiation prescription was entered and confirmed.  A total of 6 complex treatment devices were fabricated which relate to the designed radiation treatment fields. Each of these customized fields/ complex treatment devices will be used on a daily basis during the radiation course. I have requested : Isodose Plan.   PLAN:  The patient will receive 37.5 Gy in 15 fractions.  ________________________________   Jodelle Gross, MD, PhD

## 2018-11-01 ENCOUNTER — Ambulatory Visit: Payer: Medicare Other | Admitting: Internal Medicine

## 2018-11-05 ENCOUNTER — Ambulatory Visit: Payer: Medicare Other | Admitting: Interventional Cardiology

## 2020-01-11 IMAGING — CT CT HEAD WO/W CM
3 of 4 series · 14 of 47 positions shown, 16 images · IV contrast (omnipaque)
Comparison: Prior MRI from 10/28/2010.

CLINICAL DATA: Initial evaluation for encephalopathy, metastatic
workup.

EXAM:
CT HEAD WITHOUT AND WITH CONTRAST
TECHNIQUE: Contiguous axial images were obtained from the base of the skull
through the vertex without and with intravenous contrast
CONTRAST:  50mL OMNIPAQUE IOHEXOL 300 MG/ML  SOLN

[Series 3: head without without · axial · non-contrast · 0.40mm/px · z∈[-136,-16]mm · 8 of 30 slices shown, 10 images]
[im 3/30  brain]
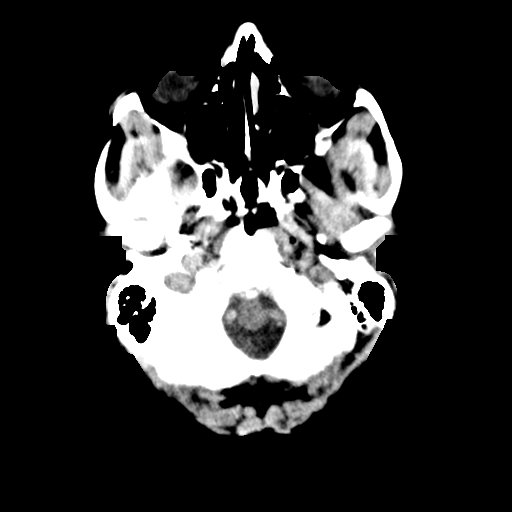
[im 3/30  bone]
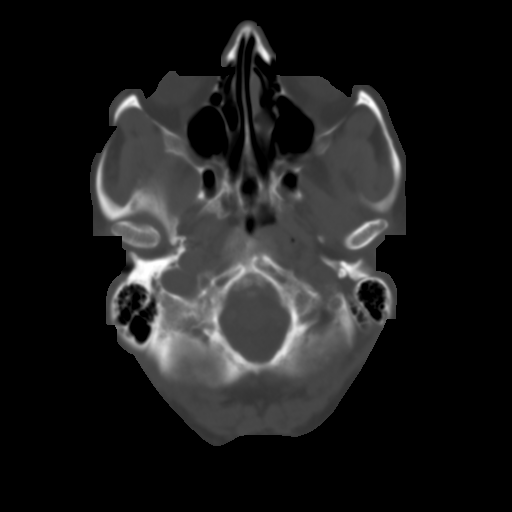
[im 7/30  brain]
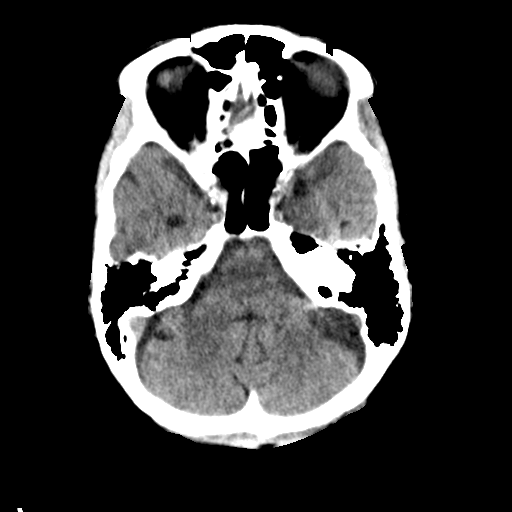
[im 11/30  brain]
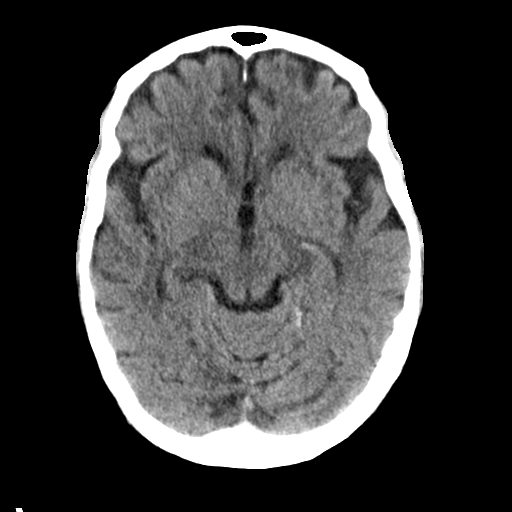
[im 13/30  brain]
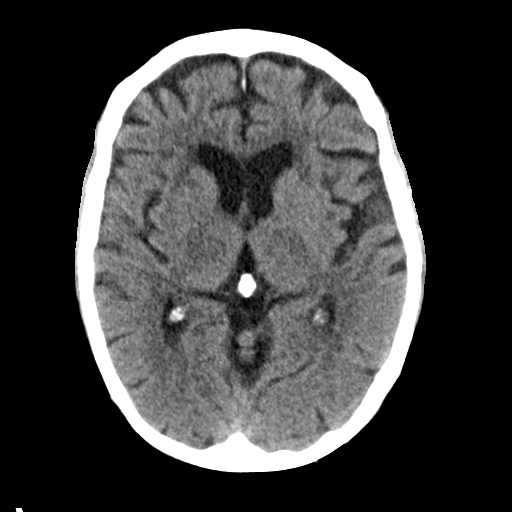
[im 17/30  brain]
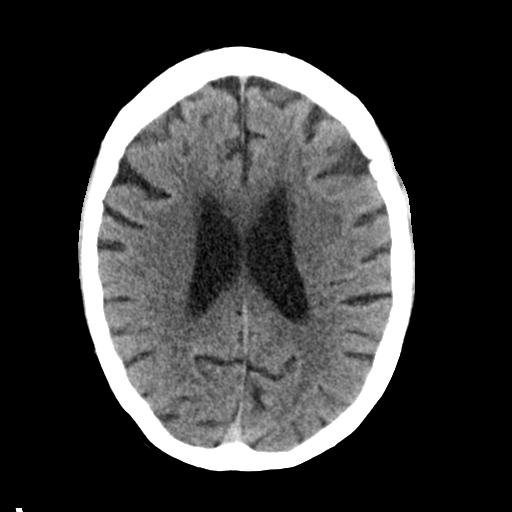
[im 17/30  bone]
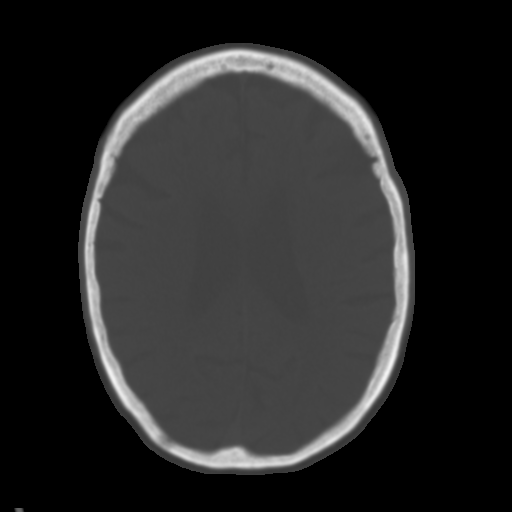
[im 19/30  brain]
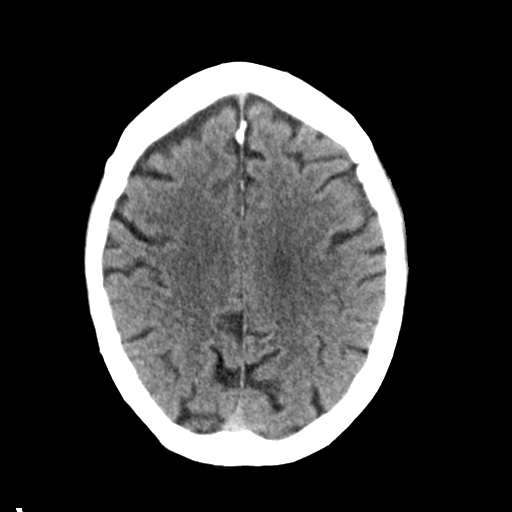
[im 23/30  brain]
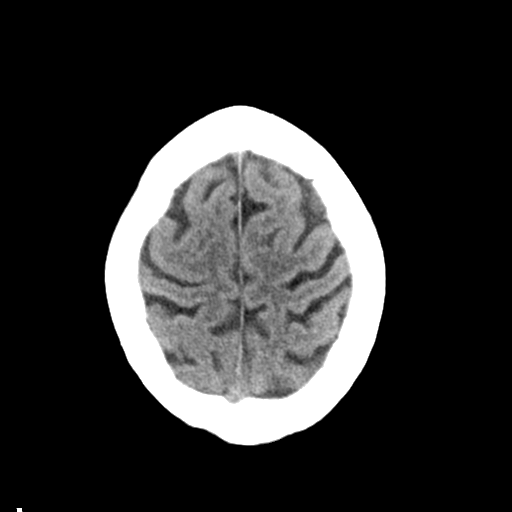
[im 27/30  brain]
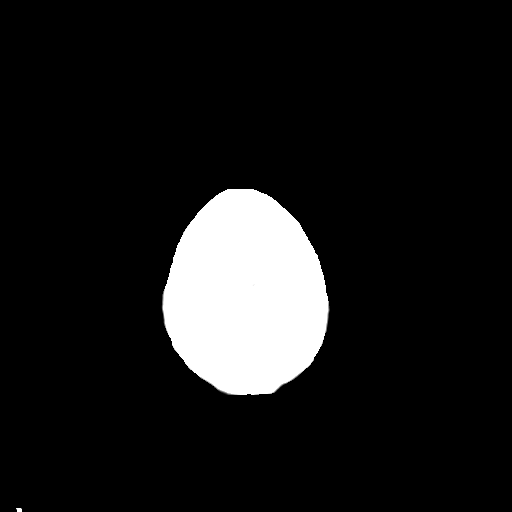

[Series 6: head with cor · coronal · 0.30mm/px · 3 of 67 slices shown]
[im 23/67  brain]
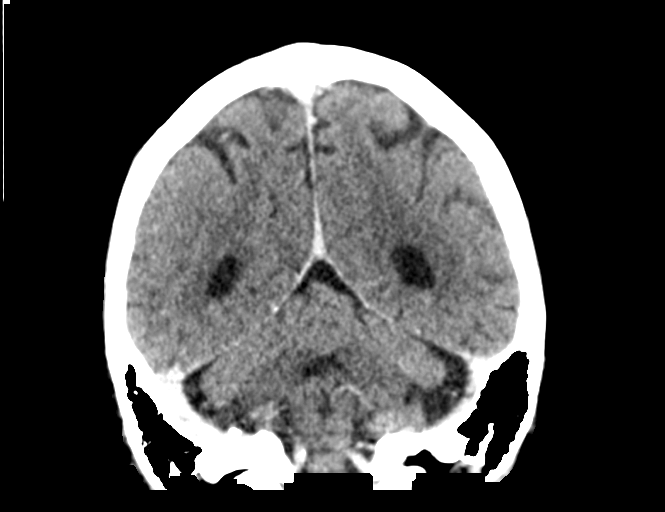
[im 30/67  brain]
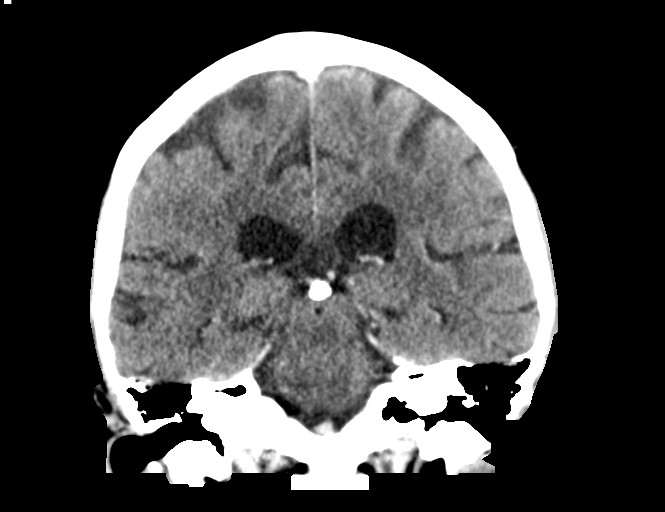
[im 37/67  brain]
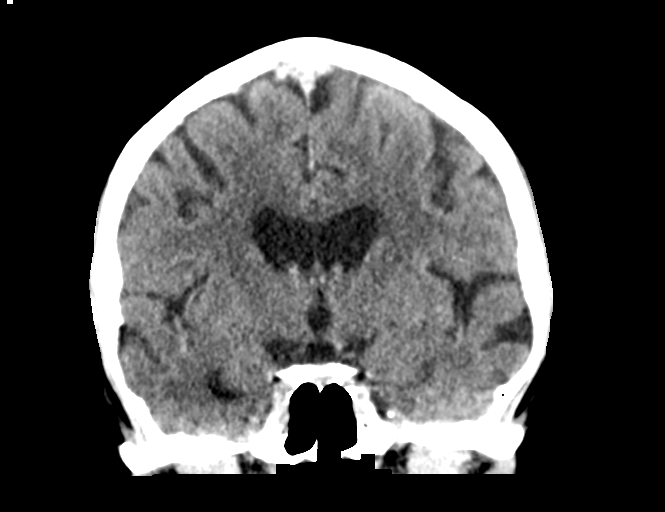

[Series 7: head with sag · sagittal · 0.28mm/px · 3 of 66 slices shown]
[im 22/66  brain]
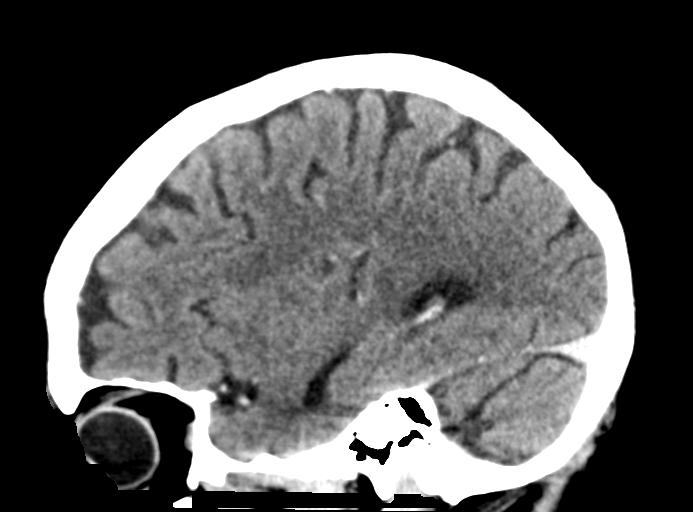
[im 33/66  brain]
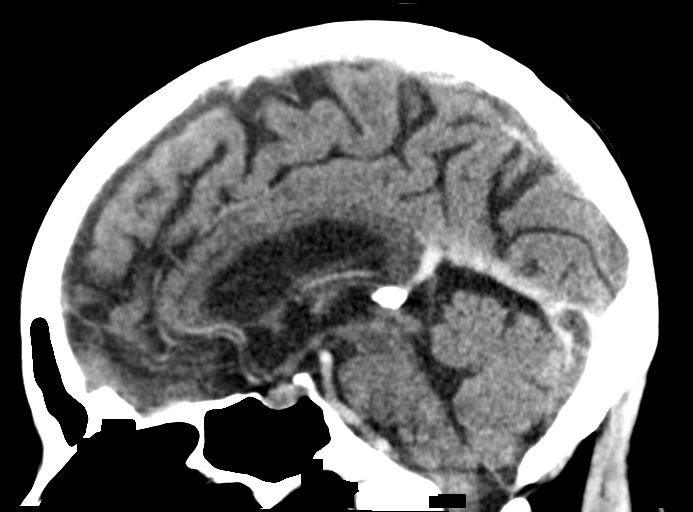
[im 44/66  brain]
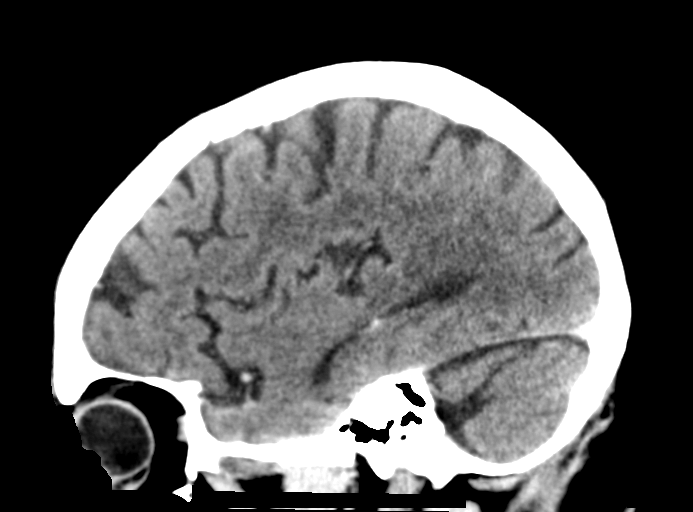

[14 of 47 positions shown; findings below may reference images not displayed]

FINDINGS: Brain: Generalized age-related cerebral atrophy with mild chronic
small vessel ischemic disease. No acute intracranial hemorrhage. No
acute large vessel territory infarct. No mass lesion, midline shift,
or mass effect. No hydrocephalus. No extra-axial fluid collection.

No abnormal enhancement seen with contrast administration. No
findings to suggest metastatic disease.

Vascular: Normal intravascular enhancement seen throughout the
intracranial circulation. Scattered vascular calcifications noted
within the carotid siphons.

Skull: Scalp soft tissues demonstrate no acute finding. Calvarium
intact. No focal osseous lesions.

Sinuses/Orbits: Globes and orbital soft tissues within normal
limits. Mild mucosal thickening within the left ethmoidal air cells.
Paranasal sinuses and mastoid air cells are otherwise clear.

Other: None.
IMPRESSION: 1. No acute intracranial abnormality. No abnormal enhancement or
evidence for metastatic disease.
2. Age-related cerebral atrophy with mild chronic small vessel
ischemic disease.

## 2020-01-11 IMAGING — US US BIOPSY CORE LIVER
1 series · 8 of 8 positions shown · non-contrast
Comparison: none

INDICATION: 77-year-old female with recently diagnosed multiple liver lesions
and bone lesions concerning for metastatic disease of uncertain
primary. Possible cholangiocarcinoma or hepatocellular carcinoma.
She presents for ultrasound-guided core biopsy of liver lesion to
confirm tissue diagnosis

[Series 1: us biopsy core liver · 8 of 8 slices shown]
[im 1/8]
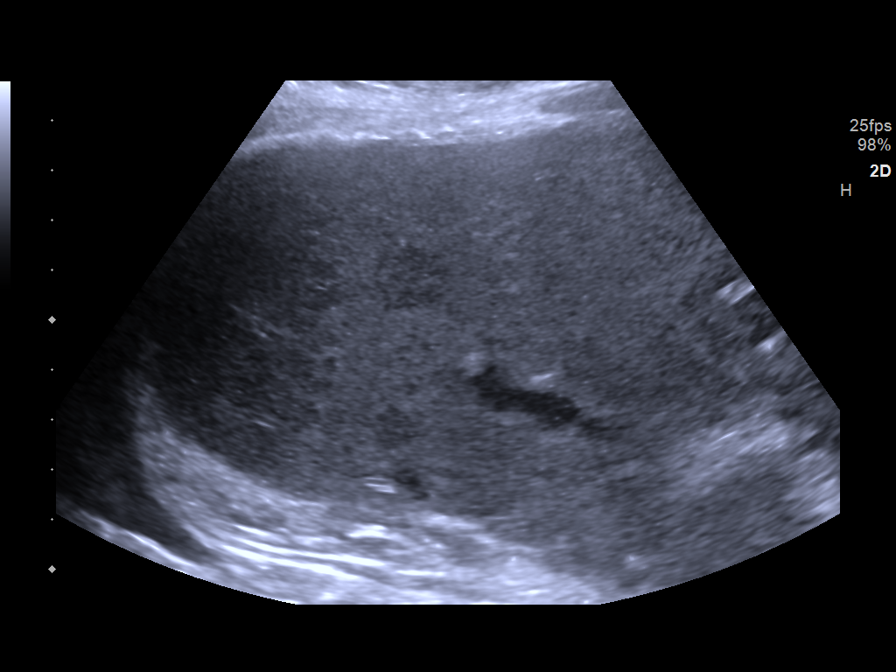
[im 2/8]
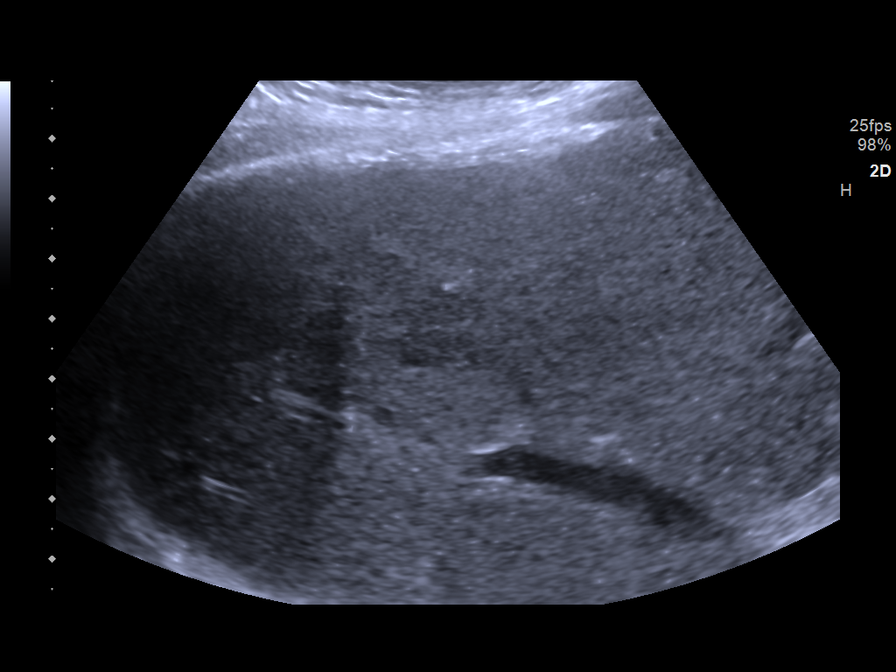
[im 3/8]
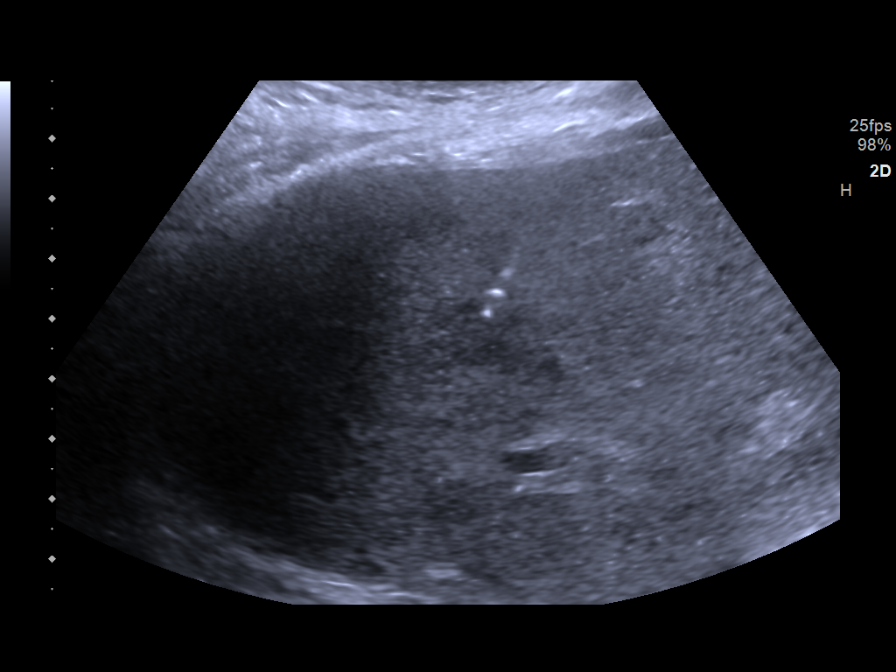
[im 4/8]
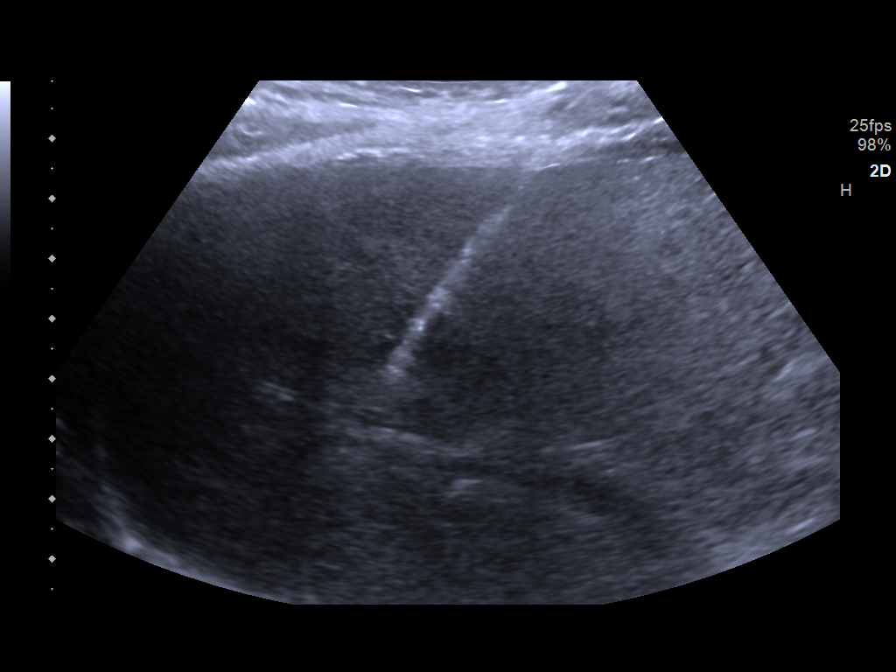
[im 5/8]
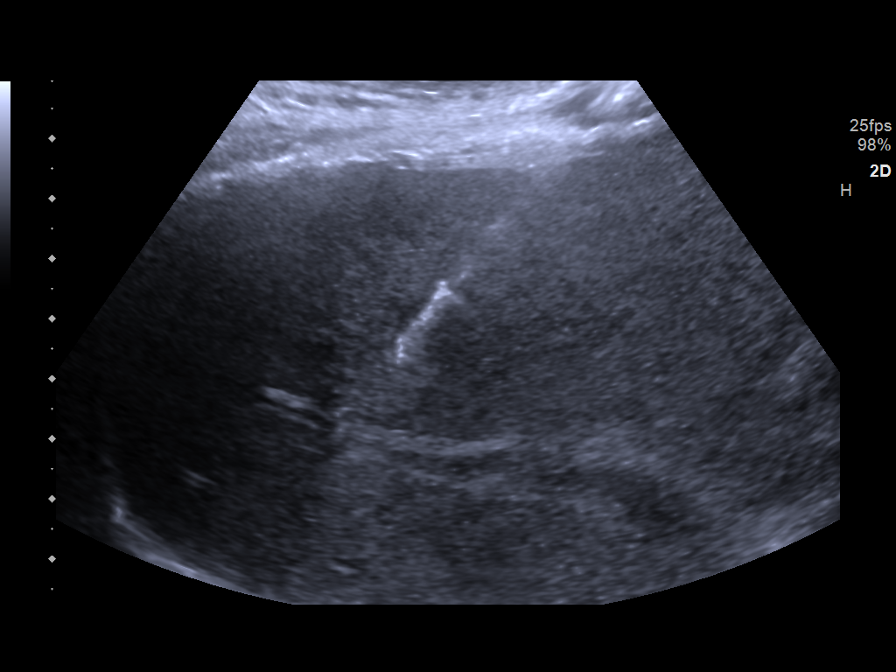
[im 6/8]
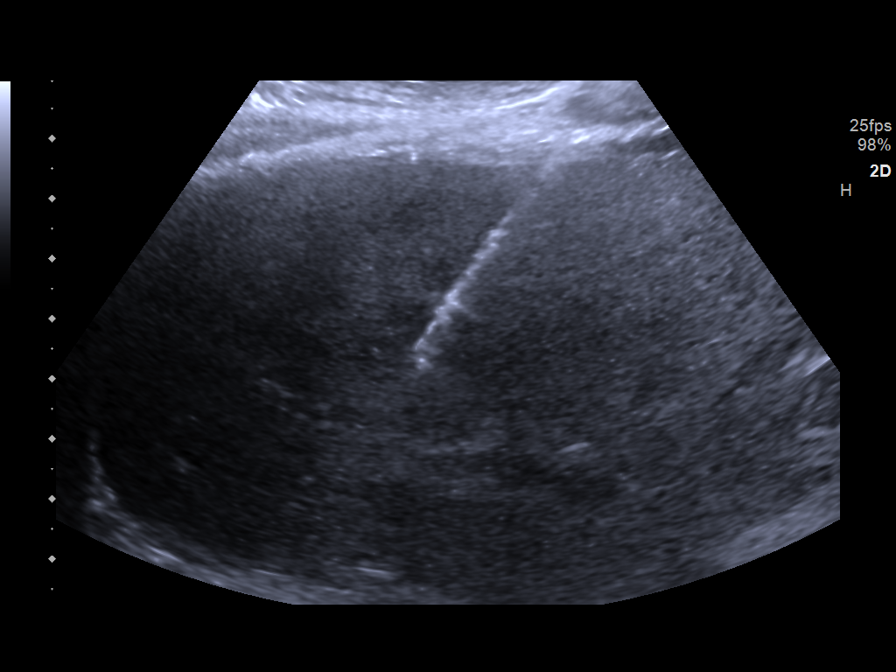
[im 7/8]
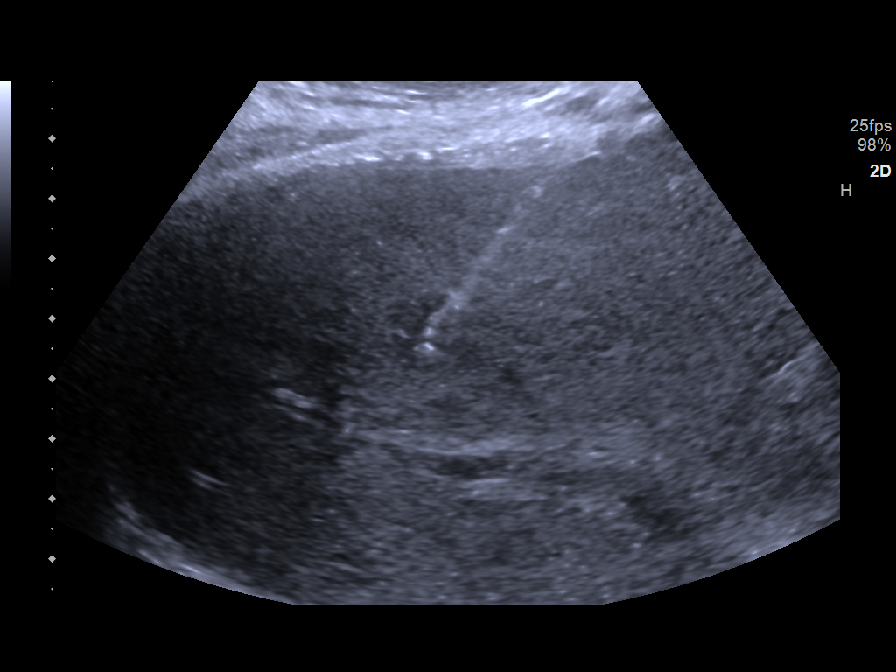
[im 8/8]
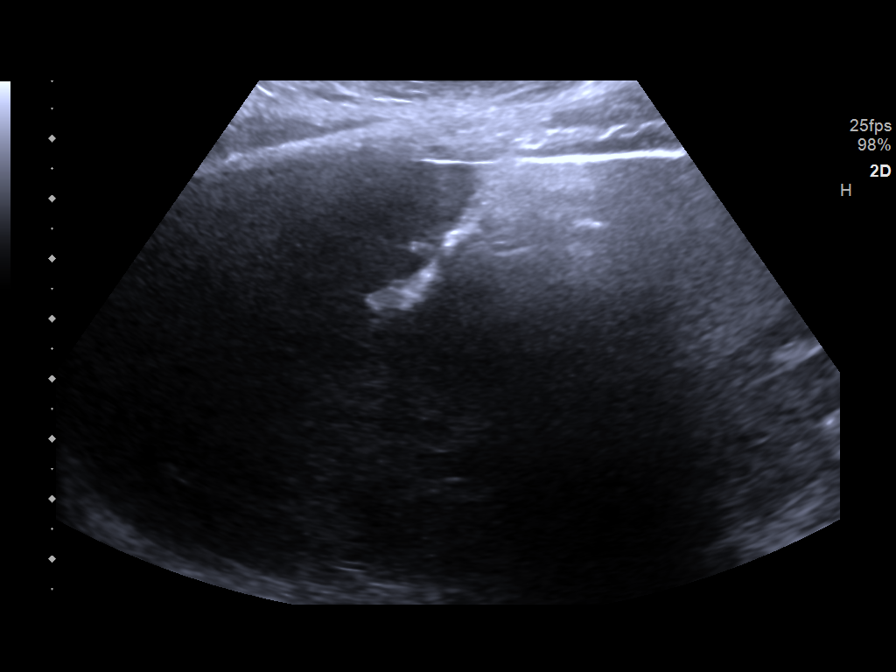

[8 of 8 positions shown; findings below may reference images not displayed]

EXAM:
Ultrasound-guided core biopsy of liver lesion

MEDICATIONS:
None.

ANESTHESIA/SEDATION:
Moderate (conscious) sedation was employed during this procedure. A
total of Versed 1.5 mg and Fentanyl 50 mcg was administered
intravenously.

Moderate Sedation Time: 16 minutes. The patient's level of
consciousness and vital signs were monitored continuously by
radiology nursing throughout the procedure under my direct
supervision.

FLUOROSCOPY TIME:  None

COMPLICATIONS:
None immediate.

PROCEDURE:
Informed written consent was obtained from the patient after a
thorough discussion of the procedural risks, benefits and
alternatives. All questions were addressed. Maximal Sterile Barrier
Technique was utilized including caps, mask, sterile gowns, sterile
gloves, sterile drape, hand hygiene and skin antiseptic. A timeout
was performed prior to the initiation of the procedure.

The liver was interrogated with ultrasound. A suitable 1.3 x 1.5 cm
hypoechoic lesion was identified in the central right hepatic lobe.
A suitable skin entry site was selected and marked. The overlying
skin was sterilely prepped and draped in standard fashion using
chlorhexidine skin prep. Local anesthesia was attained by
infiltration with 1% lidocaine. A small dermatotomy was made.

Under real-time sonographic guidance, a 17 gauge introducer needle
was advanced to the margin of the mass. Multiple 18 gauge core
biopsies were then obtained using the [employer] Rimoldi automated biopsy
device. Biopsy specimens were placed in formalin and delivered to
pathology for further analysis. As the introducer needle was
removed, the biopsy tract was embolized with a Gel-Foam slurry. Post
biopsy ultrasound images demonstrate no immediate complication.
IMPRESSION: Technically successful ultrasound-guided core biopsy of right
hepatic lesion.
